# Patient Record
Sex: Female | Born: 1970
Health system: Southern US, Community
[De-identification: ages and names within clinical notes are randomized; demographics above are authoritative.]

## PROBLEM LIST (undated history)

## (undated) DIAGNOSIS — K602 Anal fissure, unspecified: Secondary | ICD-10-CM

## (undated) DIAGNOSIS — Z87442 Personal history of urinary calculi: Secondary | ICD-10-CM

## (undated) DIAGNOSIS — T8859XA Other complications of anesthesia, initial encounter: Secondary | ICD-10-CM

## (undated) DIAGNOSIS — F32A Depression, unspecified: Secondary | ICD-10-CM

## (undated) DIAGNOSIS — T4145XA Adverse effect of unspecified anesthetic, initial encounter: Secondary | ICD-10-CM

## (undated) DIAGNOSIS — M797 Fibromyalgia: Secondary | ICD-10-CM

## (undated) DIAGNOSIS — E785 Hyperlipidemia, unspecified: Secondary | ICD-10-CM

## (undated) DIAGNOSIS — M26629 Arthralgia of temporomandibular joint, unspecified side: Secondary | ICD-10-CM

## (undated) DIAGNOSIS — K8020 Calculus of gallbladder without cholecystitis without obstruction: Secondary | ICD-10-CM

## (undated) DIAGNOSIS — Z8489 Family history of other specified conditions: Secondary | ICD-10-CM

## (undated) DIAGNOSIS — I499 Cardiac arrhythmia, unspecified: Secondary | ICD-10-CM

## (undated) DIAGNOSIS — R112 Nausea with vomiting, unspecified: Secondary | ICD-10-CM

## (undated) DIAGNOSIS — Z9889 Other specified postprocedural states: Secondary | ICD-10-CM

## (undated) DIAGNOSIS — I7300 Raynaud's syndrome without gangrene: Secondary | ICD-10-CM

## (undated) DIAGNOSIS — M3500 Sicca syndrome, unspecified: Secondary | ICD-10-CM

## (undated) DIAGNOSIS — K219 Gastro-esophageal reflux disease without esophagitis: Secondary | ICD-10-CM

## (undated) DIAGNOSIS — R011 Cardiac murmur, unspecified: Secondary | ICD-10-CM

## (undated) DIAGNOSIS — F329 Major depressive disorder, single episode, unspecified: Secondary | ICD-10-CM

## (undated) DIAGNOSIS — Q8719 Other congenital malformation syndromes predominantly associated with short stature: Secondary | ICD-10-CM

## (undated) DIAGNOSIS — I4891 Unspecified atrial fibrillation: Secondary | ICD-10-CM

## (undated) HISTORY — DX: Sjogren syndrome, unspecified: M35.00

## (undated) HISTORY — DX: Unspecified atrial fibrillation: I48.91

## (undated) HISTORY — DX: Calculus of gallbladder without cholecystitis without obstruction: K80.20

## (undated) HISTORY — DX: Gastro-esophageal reflux disease without esophagitis: K21.9

## (undated) HISTORY — DX: Depression, unspecified: F32.A

## (undated) HISTORY — DX: Major depressive disorder, single episode, unspecified: F32.9

## (undated) HISTORY — PX: MANDIBLE SURGERY: SHX707

## (undated) HISTORY — DX: Fibromyalgia: M79.7

## (undated) HISTORY — DX: Personal history of urinary calculi: Z87.442

## (undated) HISTORY — PX: WISDOM TOOTH EXTRACTION: SHX21

## (undated) HISTORY — DX: Arthralgia of temporomandibular joint, unspecified side: M26.629

## (undated) HISTORY — DX: Hyperlipidemia, unspecified: E78.5

## (undated) HISTORY — DX: Anal fissure, unspecified: K60.2

## (undated) HISTORY — DX: Raynaud's syndrome without gangrene: I73.00

## (undated) HISTORY — DX: Cardiac murmur, unspecified: R01.1

## (undated) HISTORY — PX: ABDOMINAL HYSTERECTOMY: SHX81

---

## 2000-09-30 ENCOUNTER — Other Ambulatory Visit: Admission: RE | Admit: 2000-09-30 | Discharge: 2000-09-30 | Payer: Self-pay | Admitting: *Deleted

## 2000-10-06 ENCOUNTER — Emergency Department (HOSPITAL_COMMUNITY): Admission: EM | Admit: 2000-10-06 | Discharge: 2000-10-06 | Payer: Self-pay | Admitting: Emergency Medicine

## 2000-10-06 ENCOUNTER — Encounter: Payer: Self-pay | Admitting: Emergency Medicine

## 2000-12-03 ENCOUNTER — Ambulatory Visit (HOSPITAL_COMMUNITY): Admission: RE | Admit: 2000-12-03 | Discharge: 2000-12-03 | Payer: Self-pay | Admitting: *Deleted

## 2001-09-28 ENCOUNTER — Other Ambulatory Visit: Admission: RE | Admit: 2001-09-28 | Discharge: 2001-09-28 | Payer: Self-pay | Admitting: *Deleted

## 2003-09-04 ENCOUNTER — Other Ambulatory Visit: Admission: RE | Admit: 2003-09-04 | Discharge: 2003-09-04 | Payer: Self-pay | Admitting: Family Medicine

## 2004-11-05 ENCOUNTER — Ambulatory Visit: Payer: Self-pay | Admitting: Family Medicine

## 2005-04-23 ENCOUNTER — Ambulatory Visit: Payer: Self-pay | Admitting: Family Medicine

## 2005-04-26 ENCOUNTER — Emergency Department (HOSPITAL_COMMUNITY): Admission: EM | Admit: 2005-04-26 | Discharge: 2005-04-26 | Payer: Self-pay | Admitting: Emergency Medicine

## 2005-07-28 ENCOUNTER — Ambulatory Visit: Payer: Self-pay | Admitting: Family Medicine

## 2006-01-01 ENCOUNTER — Ambulatory Visit (HOSPITAL_COMMUNITY): Admission: RE | Admit: 2006-01-01 | Discharge: 2006-01-01 | Payer: Self-pay | Admitting: Obstetrics and Gynecology

## 2006-01-29 ENCOUNTER — Other Ambulatory Visit: Admission: RE | Admit: 2006-01-29 | Discharge: 2006-01-29 | Payer: Self-pay | Admitting: Obstetrics and Gynecology

## 2006-03-09 ENCOUNTER — Ambulatory Visit: Payer: Self-pay | Admitting: Family Medicine

## 2006-08-18 ENCOUNTER — Encounter (INDEPENDENT_AMBULATORY_CARE_PROVIDER_SITE_OTHER): Payer: Self-pay | Admitting: Specialist

## 2006-08-18 ENCOUNTER — Inpatient Hospital Stay (HOSPITAL_COMMUNITY): Admission: AD | Admit: 2006-08-18 | Discharge: 2006-08-20 | Payer: Self-pay | Admitting: Obstetrics and Gynecology

## 2006-11-10 ENCOUNTER — Ambulatory Visit: Payer: Self-pay | Admitting: Family Medicine

## 2007-02-27 ENCOUNTER — Emergency Department (HOSPITAL_COMMUNITY): Admission: EM | Admit: 2007-02-27 | Discharge: 2007-02-27 | Payer: Self-pay | Admitting: Family Medicine

## 2007-03-25 ENCOUNTER — Emergency Department (HOSPITAL_COMMUNITY): Admission: EM | Admit: 2007-03-25 | Discharge: 2007-03-25 | Payer: Self-pay | Admitting: Family Medicine

## 2007-08-10 ENCOUNTER — Emergency Department (HOSPITAL_COMMUNITY): Admission: EM | Admit: 2007-08-10 | Discharge: 2007-08-10 | Payer: Self-pay | Admitting: Family Medicine

## 2007-09-23 ENCOUNTER — Ambulatory Visit: Payer: Self-pay | Admitting: Family Medicine

## 2007-12-24 ENCOUNTER — Emergency Department (HOSPITAL_COMMUNITY): Admission: EM | Admit: 2007-12-24 | Discharge: 2007-12-24 | Payer: Self-pay | Admitting: Emergency Medicine

## 2008-05-31 ENCOUNTER — Ambulatory Visit: Payer: Self-pay | Admitting: Family Medicine

## 2008-05-31 DIAGNOSIS — R05 Cough: Secondary | ICD-10-CM

## 2008-06-21 ENCOUNTER — Ambulatory Visit: Payer: Self-pay | Admitting: Internal Medicine

## 2008-06-21 DIAGNOSIS — R740 Nonspecific elevation of levels of transaminase and lactic acid dehydrogenase [LDH]: Secondary | ICD-10-CM

## 2008-06-21 DIAGNOSIS — K219 Gastro-esophageal reflux disease without esophagitis: Secondary | ICD-10-CM

## 2008-06-21 DIAGNOSIS — M3500 Sicca syndrome, unspecified: Secondary | ICD-10-CM | POA: Insufficient documentation

## 2008-06-21 LAB — CONVERTED CEMR LAB
ALT: 44 units/L — ABNORMAL HIGH (ref 0–35)
AST: 40 units/L — ABNORMAL HIGH (ref 0–37)
Albumin: 4.1 g/dL (ref 3.5–5.2)
Alkaline Phosphatase: 71 units/L (ref 39–117)
Amylase: 92 units/L (ref 27–131)
Basophils Absolute: 0.2 10*3/uL — ABNORMAL HIGH (ref 0.0–0.1)
Basophils Relative: 3.9 % — ABNORMAL HIGH (ref 0.0–3.0)
Bilirubin, Direct: 0.1 mg/dL (ref 0.0–0.3)
Eosinophils Absolute: 0.1 10*3/uL (ref 0.0–0.7)
Eosinophils Relative: 1.9 % (ref 0.0–5.0)
HCT: 42.5 % (ref 36.0–46.0)
Hemoglobin: 14.7 g/dL (ref 12.0–15.0)
Lymphocytes Relative: 41.1 % (ref 12.0–46.0)
MCHC: 34.6 g/dL (ref 30.0–36.0)
MCV: 82.8 fL (ref 78.0–100.0)
Monocytes Absolute: 0.6 10*3/uL (ref 0.1–1.0)
Monocytes Relative: 7.9 % (ref 3.0–12.0)
Neutro Abs: 2.8 10*3/uL (ref 1.4–7.7)
Neutrophils Relative %: 45.2 % (ref 43.0–77.0)
Platelets: 316 10*3/uL (ref 150–400)
RBC: 5.13 M/uL — ABNORMAL HIGH (ref 3.87–5.11)
RDW: 13.2 % (ref 11.5–14.6)
Sed Rate: 42 mm/hr — ABNORMAL HIGH (ref 0–22)
TSH: 1.82 microintl units/mL (ref 0.35–5.50)
Total Bilirubin: 0.7 mg/dL (ref 0.3–1.2)
Total Protein: 9 g/dL — ABNORMAL HIGH (ref 6.0–8.3)
WBC: 6.1 10*3/uL (ref 4.5–10.5)

## 2008-06-25 ENCOUNTER — Encounter: Payer: Self-pay | Admitting: Internal Medicine

## 2008-06-25 ENCOUNTER — Ambulatory Visit: Payer: Self-pay | Admitting: Internal Medicine

## 2008-06-26 ENCOUNTER — Telehealth: Payer: Self-pay | Admitting: Internal Medicine

## 2008-06-27 LAB — CONVERTED CEMR LAB: UREASE: NEGATIVE

## 2008-06-28 ENCOUNTER — Encounter: Payer: Self-pay | Admitting: Internal Medicine

## 2008-07-02 ENCOUNTER — Ambulatory Visit (HOSPITAL_COMMUNITY): Admission: RE | Admit: 2008-07-02 | Discharge: 2008-07-02 | Payer: Self-pay | Admitting: Internal Medicine

## 2008-07-04 ENCOUNTER — Telehealth: Payer: Self-pay | Admitting: Internal Medicine

## 2008-07-04 DIAGNOSIS — K8020 Calculus of gallbladder without cholecystitis without obstruction: Secondary | ICD-10-CM | POA: Insufficient documentation

## 2008-07-06 ENCOUNTER — Ambulatory Visit: Payer: Self-pay | Admitting: Family Medicine

## 2008-07-06 DIAGNOSIS — M26629 Arthralgia of temporomandibular joint, unspecified side: Secondary | ICD-10-CM

## 2008-07-17 ENCOUNTER — Encounter (INDEPENDENT_AMBULATORY_CARE_PROVIDER_SITE_OTHER): Payer: Self-pay | Admitting: Internal Medicine

## 2008-07-17 ENCOUNTER — Encounter: Payer: Self-pay | Admitting: Internal Medicine

## 2008-07-29 ENCOUNTER — Emergency Department (HOSPITAL_COMMUNITY): Admission: EM | Admit: 2008-07-29 | Discharge: 2008-07-29 | Payer: Self-pay | Admitting: Family Medicine

## 2008-08-13 ENCOUNTER — Telehealth: Payer: Self-pay | Admitting: Internal Medicine

## 2008-08-17 ENCOUNTER — Encounter: Payer: Self-pay | Admitting: Internal Medicine

## 2008-08-20 ENCOUNTER — Encounter (INDEPENDENT_AMBULATORY_CARE_PROVIDER_SITE_OTHER): Payer: Self-pay | Admitting: Internal Medicine

## 2008-08-20 ENCOUNTER — Encounter (INDEPENDENT_AMBULATORY_CARE_PROVIDER_SITE_OTHER): Payer: Self-pay | Admitting: General Surgery

## 2008-08-20 ENCOUNTER — Ambulatory Visit (HOSPITAL_COMMUNITY): Admission: RE | Admit: 2008-08-20 | Discharge: 2008-08-20 | Payer: Self-pay | Admitting: General Surgery

## 2008-08-20 HISTORY — PX: CHOLECYSTECTOMY: SHX55

## 2008-09-04 ENCOUNTER — Encounter (INDEPENDENT_AMBULATORY_CARE_PROVIDER_SITE_OTHER): Payer: Self-pay | Admitting: Internal Medicine

## 2008-09-04 ENCOUNTER — Encounter: Payer: Self-pay | Admitting: Internal Medicine

## 2008-09-05 ENCOUNTER — Encounter (INDEPENDENT_AMBULATORY_CARE_PROVIDER_SITE_OTHER): Payer: Self-pay | Admitting: Internal Medicine

## 2008-10-24 ENCOUNTER — Ambulatory Visit: Payer: Self-pay | Admitting: Family Medicine

## 2008-10-24 LAB — CONVERTED CEMR LAB: Rapid Strep: NEGATIVE

## 2009-02-13 ENCOUNTER — Encounter (INDEPENDENT_AMBULATORY_CARE_PROVIDER_SITE_OTHER): Payer: Self-pay | Admitting: Internal Medicine

## 2009-03-17 ENCOUNTER — Emergency Department (HOSPITAL_COMMUNITY): Admission: EM | Admit: 2009-03-17 | Discharge: 2009-03-17 | Payer: Self-pay | Admitting: Family Medicine

## 2009-04-18 ENCOUNTER — Encounter: Payer: Self-pay | Admitting: Family Medicine

## 2009-07-08 ENCOUNTER — Ambulatory Visit: Payer: Self-pay | Admitting: Family Medicine

## 2009-08-13 ENCOUNTER — Ambulatory Visit: Payer: Self-pay | Admitting: Internal Medicine

## 2009-08-17 ENCOUNTER — Telehealth: Payer: Self-pay | Admitting: Family Medicine

## 2009-09-23 ENCOUNTER — Ambulatory Visit: Payer: Self-pay | Admitting: Family Medicine

## 2009-10-07 ENCOUNTER — Ambulatory Visit: Payer: Self-pay | Admitting: Family Medicine

## 2009-10-07 ENCOUNTER — Other Ambulatory Visit: Admission: RE | Admit: 2009-10-07 | Discharge: 2009-10-07 | Payer: Self-pay | Admitting: Family Medicine

## 2009-10-07 LAB — CONVERTED CEMR LAB
BUN: 9 mg/dL (ref 6–23)
Basophils Relative: 1.1 % (ref 0.0–3.0)
Bilirubin Urine: NEGATIVE
Bilirubin, Direct: 0.1 mg/dL (ref 0.0–0.3)
Calcium: 9.1 mg/dL (ref 8.4–10.5)
Cholesterol: 176 mg/dL (ref 0–200)
Creatinine, Ser: 0.7 mg/dL (ref 0.4–1.2)
Eosinophils Relative: 1.6 % (ref 0.0–5.0)
GFR calc non Af Amer: 99.37 mL/min (ref >60)
HCT: 41.6 % (ref 36.0–46.0)
Hemoglobin: 13.5 g/dL (ref 12.0–15.0)
Ketones, urine, test strip: NEGATIVE
LDL Cholesterol: 115 mg/dL — ABNORMAL HIGH (ref 0–99)
Lymphs Abs: 2 10*3/uL (ref 0.7–4.0)
MCV: 85.9 fL (ref 78.0–100.0)
Monocytes Absolute: 0.5 10*3/uL (ref 0.1–1.0)
Neutro Abs: 2.5 10*3/uL (ref 1.4–7.7)
Neutrophils Relative %: 47.5 % (ref 43.0–77.0)
Nitrite: NEGATIVE
Protein, U semiquant: NEGATIVE
RBC: 4.84 M/uL (ref 3.87–5.11)
Total Bilirubin: 0.8 mg/dL (ref 0.3–1.2)
Total CHOL/HDL Ratio: 4
Triglycerides: 100 mg/dL (ref 0.0–149.0)
Urobilinogen, UA: 0.2
VLDL: 20 mg/dL (ref 0.0–40.0)
WBC: 5.2 10*3/uL (ref 4.5–10.5)

## 2009-10-10 ENCOUNTER — Encounter (INDEPENDENT_AMBULATORY_CARE_PROVIDER_SITE_OTHER): Payer: Self-pay | Admitting: *Deleted

## 2009-10-23 ENCOUNTER — Encounter: Payer: Self-pay | Admitting: Family Medicine

## 2010-01-24 ENCOUNTER — Ambulatory Visit: Payer: Self-pay | Admitting: Family Medicine

## 2010-01-24 DIAGNOSIS — R0600 Dyspnea, unspecified: Secondary | ICD-10-CM | POA: Insufficient documentation

## 2010-01-24 DIAGNOSIS — J069 Acute upper respiratory infection, unspecified: Secondary | ICD-10-CM | POA: Insufficient documentation

## 2010-01-24 DIAGNOSIS — R0602 Shortness of breath: Secondary | ICD-10-CM

## 2010-01-27 ENCOUNTER — Telehealth: Payer: Self-pay | Admitting: Family Medicine

## 2010-03-04 ENCOUNTER — Ambulatory Visit: Payer: Self-pay | Admitting: Family Medicine

## 2010-03-04 DIAGNOSIS — R109 Unspecified abdominal pain: Secondary | ICD-10-CM

## 2010-03-04 DIAGNOSIS — Z87442 Personal history of urinary calculi: Secondary | ICD-10-CM | POA: Insufficient documentation

## 2010-03-04 LAB — CONVERTED CEMR LAB
Blood in Urine, dipstick: NEGATIVE
Glucose, Urine, Semiquant: NEGATIVE
Urobilinogen, UA: 0.2
WBC Urine, dipstick: NEGATIVE
pH: 6

## 2010-03-10 ENCOUNTER — Ambulatory Visit: Payer: Self-pay | Admitting: Cardiology

## 2010-04-24 ENCOUNTER — Encounter: Payer: Self-pay | Admitting: Family Medicine

## 2010-09-29 ENCOUNTER — Ambulatory Visit
Admission: RE | Admit: 2010-09-29 | Discharge: 2010-09-29 | Payer: Self-pay | Source: Home / Self Care | Attending: Family Medicine | Admitting: Family Medicine

## 2010-09-29 DIAGNOSIS — J019 Acute sinusitis, unspecified: Secondary | ICD-10-CM | POA: Insufficient documentation

## 2010-09-29 DIAGNOSIS — J0100 Acute maxillary sinusitis, unspecified: Secondary | ICD-10-CM | POA: Insufficient documentation

## 2010-10-03 ENCOUNTER — Ambulatory Visit
Admission: RE | Admit: 2010-10-03 | Discharge: 2010-10-03 | Payer: Self-pay | Source: Home / Self Care | Attending: Family Medicine | Admitting: Family Medicine

## 2010-10-03 ENCOUNTER — Telehealth: Payer: Self-pay | Admitting: Family Medicine

## 2010-10-05 ENCOUNTER — Encounter: Payer: Self-pay | Admitting: Internal Medicine

## 2010-10-16 NOTE — Progress Notes (Signed)
Summary: Feeling bad-in the office last week  Phone Note Call from Patient   Caller: Patient Reason for Call: Acute Illness Summary of Call: Pt was seen by Dr.Bedsole on last week, says she was told if not any better to call back and something would be called in for her.  Pt says she is experiencing some coughing up green sputnum,says she does not feel any better... Call back # [phone] [facility].Marland KitchenMarland KitchenDaine Gip  Jan 27, 2010 10:10 AM  Initial call taken by: Daine Gip,  Jan 27, 2010 10:10 AM    New/Updated Medications: AZITHROMYCIN 250 MG  TABS (AZITHROMYCIN) 2 by  mouth today and then 1 daily for 4 days Prescriptions: AZITHROMYCIN 250 MG  TABS (AZITHROMYCIN) 2 by  mouth today and then 1 daily for 4 days  #6 x 0   Entered and Authorized by:   Ruthe Mannan MD   Signed by:   Ruthe Mannan MD on 01/27/2010   Method used:   [facility]        [id number] (retail[facility]       [address]       [place], [place]  [address]       Ph: [phone] or [phone]       Fax: [phone]   RxID:   [id number]   Appended Document: Feeling bad-in the office last week Called pt informed her medication has been sent to the pharmacy, told her if she has problems to call us back, told pt to check w/ the pharmacy before the end of the day..cdavis 01-27-2010-11:50am

## 2010-10-16 NOTE — Assessment & Plan Note (Signed)
Summary: CPX/CLE   Vital Signs:  Patient profile:   40 year old female LMP:     09/28/2009 Height:      67 inches Weight:      151.50 pounds BMI:     23.81 Temp:     98.3 degrees F oral Pulse rate:   84 / minute Pulse rhythm:   regular BP sitting:   92 / 58  (left arm) Cuff size:   regular  Vitals Entered By: Delilah Shan CMA Duncan Dull) (October 07, 2009 9:09 AM) CC: CPX LMP (date): 09/28/2009     Enter LMP: 09/28/2009   History of Present Illness: 40 yo female with h/o Sjogren's syndrome here for CPX/Pap with complaint of pelvic pressure.  Pelvic pressure- occurred for a few hours over the weekend, then resolved.  Had some mild dysuria with it which also resolved.  no fevers, no n/v/d.  Has h/o kidney stones which felt like this in past.  No back pain, no hematuria.  Sjorgren's- followed by rheum.  Has been on Plaquenil since 03/2009.  Has helped a little with leg pain.  Has not found much relief from the dry eyes, followed by optho regularly.  Well woman- G1P1.  LMP 09/28/09.  Uses condoms for [facility].  No h/o abnormal pap smears.  No family or personal h/o breast, uterine, ovarian, or cervical cancer.  Current Medications (verified): 1)  Mobic 15 Mg Tabs (Meloxicam) .... Take 1 Tablet By Mouth Two Times A Day 2)  Plaquenil 200 Mg Tabs (Hydroxychloroquine Sulfate) .... Take 1 Tablet By Mouth Two Times A Day 3)  Ventolin Hfa 108 (90 Base) Mcg/act Aers (Albuterol Sulfate) .... 2 Puff Every 4-6 Hours As Needed 4)  Fluticasone Propionate 50 Mcg/act Susp (Fluticasone Propionate) .... 2 Sprays in Each Nostril Daily For Sinus and Allergy Symptoms 5)  Prilosec Otc 20 Mg Tbec (Omeprazole Magnesium) .... One By Mouth Daily  Allergies: 1)  ! Sulfa 2)  ! Augmentin  Past History:  Past Medical History: Last updated: 06/21/2008 Anal Fissure Depression Fibromyalgia GERD Hyperlipidemia  Past Surgical History: Last updated: 09/05/2008 Unremarkable cholecystectomy --08/20/08  Family  History: Last updated: 06/21/2008 Bone Cancer: Grandma Family History of Colon Polyps:Father Family History of Diabetes: grandmother Family History of Heart Disease: grandmother  Social History: Last updated: 06/21/2008 married Patient has never smoked.  Alcohol Use - no Daily Caffeine Use: 2 cups per day Illicit Drug Use - no Patient does not get regular exercise.   Review of Systems      See HPI General:  Denies chills, fatigue, and fever. Eyes:  Complains of eye irritation; denies blurring, eye pain, halos, itching, light sensitivity, red eye, vision loss-1 eye, and vision loss-both eyes. ENT:  Denies difficulty swallowing. CV:  Denies chest pain or discomfort. Resp:  Denies shortness of breath. GI:  Denies abdominal pain and bloody stools. GU:  Complains of dysuria; denies hematuria, incontinence, and urinary frequency. Derm:  Denies rash. Psych:  Denies anxiety and depression. Endo:  Denies cold intolerance and heat intolerance. Heme:  Denies abnormal bruising, enlarge lymph nodes, fevers, pallor, and skin discoloration.  Physical Exam  General:  alert.  NAD Eyes:  PERRLA, no icterus. Ears:  R ear normal and L ear normal.   Nose:  External nasal examination shows no deformity or inflammation. Nasal mucosa are pink and moist without lesions or exudates. Mouth:  Oral mucosa and oropharynx without lesions or exudates.  Teeth in good repair. Breasts:  No mass, nodules, thickening, tenderness,  bulging, retraction, inflamation, nipple discharge or skin changes noted.   On left breast- 2 cm raised nevus, regular borders, no change in size or color per pt Lungs:  Normal respiratory effort, chest expands symmetrically. Lungs are clear to auscultation, no crackles or wheezes. Heart:  Regular rate and rhythm; no murmurs, rubs,  or bruits. Abdomen:  No suprapubic tenderness No CVA tenderness Genitalia:  Pelvic Exam:        External: normal female genitalia without lesions or  masses        Vagina: normal without lesions or masses        Cervix: normal without lesions or masses        Adnexa: normal bimanual exam without masses or fullness        Uterus: normal by palpation        Pap smear: performed Psych:  Cognition and judgment appear intact. Alert and cooperative with normal attention span and concentration. No apparent delusions, illusions, hallucinations   Impression & Recommendations:  Problem # 1:  Preventive Health Care (ICD-V70.0) Reviewed preventive care protocols, scheduled due services, and updated immunizations Discussed nutrition, exercise, diet, and healthy lifestyle.  FLP, pap today.  Problem # 2:  SCREENING FOR MALIGNANT NEOPLASM OF THE CERVIX (ICD-V76.2) Pap today. Orders: Pap Smear, Thin Prep ( Collection of) (E4540)  Problem # 3:  DYSURIA (ICD-788.1) Assessment: New Resolved with unremarkable UA.  Possibly passed a kidney stone.  Advised to let me know if symptoms, recur. The following medications were removed from the medication list:    Amoxicillin-pot Clavulanate 875-125 Mg Tabs (Amoxicillin-pot clavulanate) .Marland Kitchen... 1 tab by mouth two times a day with food for sinus infection  Orders: UA Dipstick w/o Micro (manual) (98119)  Problem # 4:  SJOGREN'S SYNDROME (ICD-710.2) Assessment: Unchanged Stable.  Per rheum, will check CBC since she is on Plaquenil and BME. Orders: Venipuncture (14782) TLB-BMP (Basic Metabolic Panel-BMET) (80048-METABOL) TLB-CBC Platelet - w/Differential (85025-CBCD)  Complete Medication List: 1)  Mobic 15 Mg Tabs (Meloxicam) .... Take 1 tablet by mouth two times a day 2)  Plaquenil 200 Mg Tabs (Hydroxychloroquine sulfate) .... Take 1 tablet by mouth two times a day 3)  Ventolin Hfa 108 (90 Base) Mcg/act Aers (Albuterol sulfate) .... 2 puff every 4-6 hours as needed 4)  Fluticasone Propionate 50 Mcg/act Susp (Fluticasone propionate) .... 2 sprays in each nostril daily for sinus and allergy symptoms 5)   Prilosec Otc 20 Mg Tbec (Omeprazole magnesium) .... One by mouth daily  Other Orders: TLB-Lipid Panel (80061-LIPID) TLB-Hepatic/Liver Function Pnl (80076-HEPATIC) Prescriptions: PRILOSEC OTC 20 MG TBEC (OMEPRAZOLE MAGNESIUM) one by mouth daily  #30 x 11   Entered and Authorized by:   Ruthe Mannan MD   Signed by:   Ruthe Mannan MD on 10/07/2009   Method used:   Print then Give to Patient   RxID:   [id number]   Current Allergies (reviewed today): ! SULFA ! AUGMENTIN   Laboratory Results   Urine Tests   Date/Time Reported: October 07, 2009 9:33 AM   Routine Urinalysis   Color: yellow Appearance: Clear Glucose: negative   (Normal Range: Negative) Bilirubin: negative   (Normal Range: Negative) Ketone: negative   (Normal Range: Negative) Spec. Gravity: >=1.030   (Normal Range: 1.003-1.035) Blood: negative   (Normal Range: Negative) pH: 6.0   (Normal Range: 5.0-8.0) Protein: negative   (Normal Range: Negative) Urobilinogen: 0.2   (Normal Range: 0-1) Nitrite: negative   (Normal Range: Negative) Leukocyte Esterace: negative   (Normal Range:  Negative)

## 2010-10-16 NOTE — Assessment & Plan Note (Signed)
Summary: 4:15 CONGESTION,COUGH/CLE   Vital Signs:  Patient profile:   40 year old female Height:      67 inches Weight:      131.75 pounds BMI:     20.71 Temp:     98.7 degrees F oral Pulse rate:   92 / minute Pulse rhythm:   regular BP sitting:   124 / 76  (left arm) Cuff size:   regular  Vitals Entered By: Delilah Shan CMA Mitzi Lilja Dull) (September 29, 2010 4:22 PM) CC: Congestion, cough   History of Present Illness: 40 year old was sick last week.  Pt has no fever this AM, but has been coughing since Friday.  Sinus HA.  No ear pain.  Voice changed.  short of breath in the winter at baseline due to Sjogren's syndrome and dry mouth.  On plaquenil and mobic.  Minimal sputum.    Allergies: 1)  ! Sulfa 2)  ! Augmentin  Past History:  Past Medical History: Last updated: 03/04/2010 Anal Fissure Depression Fibromyalgia GERD Hyperlipidemia Nephrolithiasis, hx of  Review of Systems       See HPI.  Otherwise negative.    Physical Exam  General:  GEN: nad, alert and oriented HEENT: mucous membranes slightly dry as expected TM w/o erythema, nasal epithelium injected, OP with cobblestoning, max sinus tender to palpation bilaterally NECK: supple w/o LA CV: rrr. PULM: no inc wob, ctab EXT: no edema    Impression & Recommendations:  Problem # 1:  SINUSITIS - ACUTE-NOS (ICD-461.9) Will treat given underlying Sjrogen's disease.  Use SABA as needed and start the antibiotics today.  Supporitve tx o/w.  She agrees.  follow up as needed.  Nontoxic.   Her updated medication list for this problem includes:    Fluticasone Propionate 50 Mcg/act Susp (Fluticasone propionate) .Marland Kitchen... 2 sprays in each nostril daily for sinus and allergy symptoms as needed    Zithromax 250 Mg Tabs (Azithromycin) .Marland Kitchen... 2 by mouth today and 1 by mouth once daily for 4 days after that  Orders: Prescription Created Electronically [phone])  Complete Medication List: 1)  Mobic 15 Mg Tabs (Meloxicam) .... Take 1  tablet by mouth two times a day 2)  Plaquenil 200 Mg Tabs (Hydroxychloroquine sulfate) .... Take 1 tablet by mouth two times a day 3)  Ventolin Hfa 108 (90 Base) Mcg/act Aers (Albuterol sulfate) .... 2 puff every 4-6 hours as needed 4)  Fluticasone Propionate 50 Mcg/act Susp (Fluticasone propionate) .... 2 sprays in each nostril daily for sinus and allergy symptoms as needed 5)  Prilosec Otc 20 Mg Tbec (Omeprazole magnesium) .... One by mouth daily, rarely 6)  Vitamin D 1000 Unit Tabs (Cholecalciferol) .... Once daily 7)  Zithromax 250 Mg Tabs (Azithromycin) .... 2 by mouth today and 1 by mouth once daily for 4 days after that  Patient Instructions: 1)  Get plenty of rest, drink lots of clear liquids, and use Tylenol for fever and comfort. Start the antibiotics today.   2)  Please allow patient to work from home this week.  Potentially contagious.  Prescriptions: ZITHROMAX 250 MG TABS (AZITHROMYCIN) 2 by mouth today and 1 by mouth once daily for 4 days after that  #6 x 0   Entered and Authorized by:   Crawford Givens MD   Signed by:   Crawford Givens MD on 09/29/2010   Method used:   [facility]        [facility]  [phone][facility] (retail)  [address]       [place], [place]  [address]       Ph: [phone] or [phone]       Fax: [phone]   RxID:   [id number]    Orders Added: 1)  Est. Patient Level III [76160] 2)  Prescription Created Electronically [phone]    Current Allergies (reviewed today): ! SULFA ! AUGMENTIN

## 2010-10-16 NOTE — Assessment & Plan Note (Signed)
Summary: DISCUSS KIDNEY STONE/CLE   Vital Signs:  Patient profile:   40 year old female Height:      67 inches Weight:      155.13 pounds BMI:     24.38 Temp:     98.3 degrees F oral Pulse rate:   72 / minute Pulse rhythm:   regular BP sitting:   102 / 70  (left arm) Cuff size:   regular  Vitals Entered By: Linde Gillis CMA Duncan Dull) (March 04, 2010 8:00 AM) CC: discuss kidney stones   History of Present Illness: 40 yo here for ? kidney stones.  Had a h/o kidney stones several years ago.  Over past 6 months, has intermittent suprapubic pain, "bladder spasms." No dysuria, hematuria, fevers, nausea or vomiting. Sometimes has back pain associated with it.  Has been taking Vitamin D after told that she was deficient, prescribed by rheumatology. She has concerns that perhaps this is worsening her symptoms. Also notices that it is usually aggrevated by caffeine/soft drinks.  Current Medications (verified): 1)  Mobic 15 Mg Tabs (Meloxicam) .... Take 1 Tablet By Mouth Two Times A Day 2)  Plaquenil 200 Mg Tabs (Hydroxychloroquine Sulfate) .... Take 1 Tablet By Mouth Two Times A Day 3)  Ventolin Hfa 108 (90 Base) Mcg/act Aers (Albuterol Sulfate) .... 2 Puff Every 4-6 Hours As Needed 4)  Fluticasone Propionate 50 Mcg/act Susp (Fluticasone Propionate) .... 2 Sprays in Each Nostril Daily For Sinus and Allergy Symptoms 5)  Prilosec Otc 20 Mg Tbec (Omeprazole Magnesium) .... One By Mouth Daily  Allergies: 1)  ! Sulfa 2)  ! Augmentin  Past History:  Past Surgical History: Last updated: 09/05/2008 Unremarkable cholecystectomy --08/20/08  Family History: Last updated: 06/21/2008 Bone Cancer: Grandma Family History of Colon Polyps:Father Family History of Diabetes: grandmother Family History of Heart Disease: grandmother  Social History: Last updated: 06/21/2008 married Patient has never smoked.  Alcohol Use - no Daily Caffeine Use: 2 cups per day Illicit Drug Use - no Patient  does not get regular exercise.   Risk Factors: Exercise: no (06/21/2008)  Risk Factors: Smoking Status: never (06/21/2008)  Past Medical History: Anal Fissure Depression Fibromyalgia GERD Hyperlipidemia Nephrolithiasis, hx of  Review of Systems      See HPI General:  Denies fever and malaise. GI:  Complains of abdominal pain; denies nausea and vomiting. GU:  Denies dysuria, hematuria, urinary frequency, and urinary hesitancy.  Physical Exam  General:  alert.  NAD Mouth:  MMM Abdomen:  No suprapubic tenderness No CVA tenderness Psych:  Cognition and judgment appear intact. Alert and cooperative with normal attention span and concentration. No apparent delusions, illusions, hallucinations   Impression & Recommendations:  Problem # 1:  ABDOMINAL PAIN, SUPRAPUBIC (ICD-789.09) Assessment New UA neg for blood or signs of infection, will send for culture to verify absence of infection. Given h/o nephrolithiasis, seen on ultrasound in past, will send for CT of abd/pelvix as it is gold standard to see if she has other stones causing obstructive symptoms. If neg, consider IC. Her updated medication list for this problem includes:    Mobic 15 Mg Tabs (Meloxicam) .Marland Kitchen... Take 1 tablet by mouth two times a day  Orders: Radiology Referral (Radiology) T-Culture, Urine ([id number]) UA Dipstick w/o Micro (manual) (09811)  Complete Medication List: 1)  Mobic 15 Mg Tabs (Meloxicam) .... Take 1 tablet by mouth two times a day 2)  Plaquenil 200 Mg Tabs (Hydroxychloroquine sulfate) .... Take 1 tablet by mouth two times a day  3)  Ventolin Hfa 108 (90 Base) Mcg/act Aers (Albuterol sulfate) .... 2 puff every 4-6 hours as needed 4)  Fluticasone Propionate 50 Mcg/act Susp (Fluticasone propionate) .... 2 sprays in each nostril daily for sinus and allergy symptoms 5)  Prilosec Otc 20 Mg Tbec (Omeprazole magnesium) .... One by mouth daily  Patient Instructions: 1)  Please stop by to see Shirlee Limerick  on your way out.  Current Allergies (reviewed today): ! SULFA ! AUGMENTIN  Laboratory Results   Urine Tests  Date/Time Received: March 04, 2010 8:16 AM   Routine Urinalysis   Color: yellow Appearance: Clear Glucose: negative   (Normal Range: Negative) Bilirubin: negative   (Normal Range: Negative) Ketone: negative   (Normal Range: Negative) Spec. Gravity: >=1.030   (Normal Range: 1.003-1.035) Blood: negative   (Normal Range: Negative) pH: 6.0   (Normal Range: 5.0-8.0) Protein: trace   (Normal Range: Negative) Urobilinogen: 0.2   (Normal Range: 0-1) Nitrite: negative   (Normal Range: Negative) Leukocyte Esterace: negative   (Normal Range: Negative)        Appended Document: DISCUSS KIDNEY STONE/CLE

## 2010-10-16 NOTE — Progress Notes (Signed)
Summary: still feeling sick   Phone Note Call from Patient Call back at [phone]   Caller: Patient Call For: Ruthe Mannan MD Summary of Call: Patient was seen on the 16th and she says that today she feels worse than before. She has now finished her zpak, but she feels that the congestion has moved into her chest. She wants to know if she should be seen again for possibly a chest xray or if she should give it a couple of more days. [facility]. Initial call taken by: Melody Comas,  October 03, 2010 8:46 AM  Follow-up for Phone Call        Patient called back again to see what Dr. Para March has suggested, I explained to her that he is seeing patients and hasn't had a chance to see the note. She is saying that if she needs to be seen, she wants to be seen today, but there are not appt. Please advise.  Melody Comas  October 03, 2010 11:18 AM   Additional Follow-up for Phone Call Additional follow up Details #1::        If she is short of breath, then she needs to be seen today and added onto my schedule at 4:30.  Otherwise, it would be reasonable to give this a few more days as the antibiotics are still working- she could follow up next week.  thanks.  Additional Follow-up by: Crawford Givens MD,  October 03, 2010 11:24 AM    Additional Follow-up for Phone Call Additional follow up Details #2::    Patient added to end of day.  Follow-up by: Melody Comas,  October 03, 2010 11:29 AM

## 2010-10-16 NOTE — Letter (Signed)
Summary: [facility]   Imported By: Lanelle Bal 11/02/2009 11:22:17  _____________________________________________________________________  External Attachment:    Type:   Image     Comment:   External Document

## 2010-10-16 NOTE — Letter (Signed)
Summary: Results Follow up Letter  [place] [facility]  [address] [place], [place] [address]   Phone: [phone]  Fax: [phone]    10/10/2009 MRN: [id number]    NIKAYA NASBY [address] [place], [place]  [address]    Dear Ms. Laural Benes,  The following are the results of your recent test(s):  Test         Result    Pap Smear:        Normal __X___  Not Normal _____ Comments:   Repeat in 1-2 years. ______________________________________________________ Cholesterol: LDL(Bad cholesterol):         Your goal is less than:         HDL (Good cholesterol):       Your goal is more than: Comments:  ______________________________________________________ Mammogram:        Normal _____  Not Normal _____ Comments:  ___________________________________________________________________ Hemoccult:        Normal _____  Not normal _______ Comments:    _____________________________________________________________________ Other Tests:    We routinely do not discuss normal results over the telephone.  If you desire a copy of the results, or you have any questions about this information we can discuss them at your next office visit.   Sincerely,    Ruthe Mannan,  M.D.  TA:lsf

## 2010-10-16 NOTE — Letter (Signed)
Summary: [facility]   Imported By: Lanelle Bal 05/07/2010 12:22:13  _____________________________________________________________________  External Attachment:    Type:   Image     Comment:   External Document

## 2010-10-16 NOTE — Assessment & Plan Note (Signed)
Summary: SOB,wheezing,congested/alc   Vital Signs:  Patient profile:   40 year old female Height:      67 inches Weight:      131.75 pounds BMI:     20.71 O2 Sat:      98 % on Room air Temp:     98.2 degrees F oral Pulse rate:   88 / minute Pulse rhythm:   regular BP sitting:   118 / 80  (left arm) Cuff size:   regular  Vitals Entered By: Delilah Shan CMA Duncan Dull) (October 03, 2010 4:39 PM)  O2 Flow:  Room air CC: SOB, wheezing, chest congestion   History of Present Illness: ST and fever is better, no fever since Wednesday.  Now with "rattle" in chest and cough.  No sputum.  + Wheeze.  Using ventolin occ, some help with cough but jittery with use- even 1 puff.  Last dose of zmax today.  intolerant of prednisone.    Allergies: 1)  ! Sulfa 2)  ! Augmentin 3)  ! * Albuterol  Past History:  Past Medical History: Anal Fissure Depression Fibromyalgia GERD Hyperlipidemia Nephrolithiasis, hx of Sjogren's syndrome  Review of Systems       See HPI.  Otherwise negative.    Physical Exam  General:  GEN: nad, alert and oriented HEENT: mucous membranes slightly dry as expected TM w/o erythema, nasal epithelium mildly  injected, OP with minimal cobblestoning, max sinus not tender to palpation bilaterally NECK: supple w/o LA CV: rrr. PULM: no inc wob, clear on L side but occ scattered ronchi on R, no wheeze EXT: no edema    Impression & Recommendations:  Problem # 1:  COUGH (ICD-786.2) With R>L ronchi, I would continue antibiotic coverage and broaden with quinolone.  D/w patient and will switch to xopenex due to SE from albuterol.  Hopefully she'll tolerate this better and it will help with the cough.  Nontoxic and okay for outpatient follow up.  I didn't check CXR today as patient had no increase in wob and even normal appearing cxr wouldn't have changed mgmt.  I d/w patient and she understood.  She'll call back with update and follow up sooner if symptoms are increasing.   she is okay for outpatient follow up and agrees with plan.  Orders: Prescription Created Electronically [id number])  Complete Medication List: 1)  Mobic 15 Mg Tabs (Meloxicam) .... Take 1 tablet by mouth two times a day 2)  Plaquenil 200 Mg Tabs (Hydroxychloroquine sulfate) .... Take 1 tablet by mouth two times a day 3)  Xopenex Hfa 45 Mcg/act Aero (Levalbuterol tartrate) .Marland Kitchen.. 1-2 puffs every 4 hours for cough as needed 4)  Fluticasone Propionate 50 Mcg/act Susp (Fluticasone propionate) .... 2 sprays in each nostril daily for sinus and allergy symptoms as needed 5)  Prilosec Otc 20 Mg Tbec (Omeprazole magnesium) .... One by mouth daily, rarely 6)  Vitamin D 1000 Unit Tabs (Cholecalciferol) .... Once daily 7)  Avelox 400 Mg Tabs (Moxifloxacin hcl) .Marland Kitchen.. 1 by mouth once daily for 7days  Patient Instructions: 1)  Start the antibiotics today and use the xopenex instead of the ventolin for the cough.  Call me with an update on Monday.  If you get short of breath between now and then, notify a doctor.  Prescriptions: XOPENEX HFA 45 MCG/ACT AERO (LEVALBUTEROL TARTRATE) 1-2 puffs every 4 hours for cough as needed  #1 x 1   Entered and Authorized by:   Crawford Givens MD   Signed by:  Crawford Givens MD on 10/03/2010   Method used:   [facility]        [facility]  [place] (retail)       [address]       [place], [place]  [address]       Ph: [phone] or [phone]       Fax: [phone]   RxID:   [id number] AVELOX 400 MG TABS (MOXIFLOXACIN HCL) 1 by mouth once daily for 7days  #7 x 0   Entered and Authorized by:   Crawford Givens MD   Signed by:   Crawford Givens MD on 10/03/2010   Method used:   [facility]        [facility]  [id number] ([facility]       [address]       [place], [place]  [address]       Ph: [phone] or [phone]       Fax: [phone]   RxID:   [id number]    Orders Added: 1)  Est. Patient Level III [20254] 2)  Prescription  Created Electronically [id number]

## 2010-10-16 NOTE — Assessment & Plan Note (Signed)
Summary: CONGESTION,COUGH/CLE   Vital Signs:  Patient profile:   40 year old female Height:      67 inches Weight:      152.8 pounds BMI:     24.02 Temp:     98.3 degrees F oral Pulse rate:   84 / minute Pulse rhythm:   regular BP sitting:   100 / 60  (left arm) Cuff size:   regular  Vitals Entered By: Benny Lennert CMA Duncan Dull) (Jan 24, 2010 10:42 AM)  History of Present Illness: Chief complaint cough and congestion  Using claritn, antihistamine alternate ly. Using inhaler intermittantly.  Acute Visit History:      The patient complains of cough, nasal discharge, sinus problems, and sore throat.  These symptoms began 5 days ago.  She denies chest pain, earache, and fever.  Other comments include: 40 year old sick.        The patient notes shortness of breath.  The character of the cough is described as nonproductive.  There is no history of wheezing or sleep interference associated with her cough.        She complains of sinus pressure, teeth aching, ears being blocked, nasal congestion, and purulent drainage.             Problems Prior to Update: 1)  Uri  (ICD-465.9) 2)  Health Maintenance Exam  (ICD-V70.0) 3)  Screening For Lipoid Disorders  (ICD-V77.91) 4)  Screening For Malignant Neoplasm of The Cervix  (ICD-V76.2) 5)  Tmj Pain  (ICD-524.62) 6)  Cholelithiasis  (ICD-574.20) 7)  Nonspec Elevation of Levels of Transaminase/ldh  (ICD-790.4) 8)  Gerd  (ICD-530.81) 9)  Sjogren's Syndrome  (ICD-710.2)  Current Medications (verified): 1)  Mobic 15 Mg Tabs (Meloxicam) .... Take 1 Tablet By Mouth Two Times A Day 2)  Plaquenil 200 Mg Tabs (Hydroxychloroquine Sulfate) .... Take 1 Tablet By Mouth Two Times A Day 3)  Ventolin Hfa 108 (90 Base) Mcg/act Aers (Albuterol Sulfate) .... 2 Puff Every 4-6 Hours As Needed 4)  Fluticasone Propionate 50 Mcg/act Susp (Fluticasone Propionate) .... 2 Sprays in Each Nostril Daily For Sinus and Allergy Symptoms 5)  Prilosec Otc 20 Mg Tbec  (Omeprazole Magnesium) .... One By Mouth Daily  Allergies: 1)  ! Sulfa 2)  ! Augmentin  Past History:  Past medical, surgical, family and social histories (including risk factors) reviewed, and no changes noted (except as noted below).  Past Medical History: Anal Fissure Depression Fibromyalgia GERD Hyperlipidemia  Past Surgical History: Reviewed history from 09/05/2008 and no changes required. Unremarkable cholecystectomy --08/20/08  Family History: Reviewed history from 06/21/2008 and no changes required. Bone Cancer: Grandma Family History of Colon Polyps:Father Family History of Diabetes: grandmother Family History of Heart Disease: grandmother  Social History: Reviewed history from 06/21/2008 and no changes required. married Patient has never smoked.  Alcohol Use - no Daily Caffeine Use: 2 cups per day Illicit Drug Use - no Patient does not get regular exercise.   Review of Systems General:  Complains of fatigue. CV:  Denies chest pain or discomfort. Resp:  Denies coughing up blood. GI:  Denies abdominal pain. GU:  Denies dysuria.   Impression & Recommendations:  Problem # 1:  URI (ICD-465.9)  Her updated medication list for this problem includes:    Mobic 15 Mg Tabs (Meloxicam) .Marland Kitchen... Take 1 tablet by mouth two times a day Likely viral bronchitis.Marland Kitchengiven chest tightness, decreased peak flows, mildly. Instructed on symptomatic treatment. Call if symptoms not improving in next 4-5  days as expected or increase in SOB. Use mucinex D, increase nasal saline irrigation to 2-3 times daily. Use albuterol as needed chest tightness.   Problem # 2:  DYSPNEA (ICD-786.05) Given reactive airway response to viral/bacterial and allergies as well as some SOB when no infection...recommend returnt o see primary in 1 month for lung function tests.   Complete Medication List: 1)  Mobic 15 Mg Tabs (Meloxicam) .... Take 1 tablet by mouth two times a day 2)  Plaquenil 200 Mg  Tabs (Hydroxychloroquine sulfate) .... Take 1 tablet by mouth two times a day 3)  Ventolin Hfa 108 (90 Base) Mcg/act Aers (Albuterol sulfate) .... 2 puff every 4-6 hours as needed 4)  Fluticasone Propionate 50 Mcg/act Susp (Fluticasone propionate) .... 2 sprays in each nostril daily for sinus and allergy symptoms 5)  Prilosec Otc 20 Mg Tbec (Omeprazole magnesium) .... One by mouth daily  Patient Instructions: 1)  Call if symptoms not imrpoving in next 4-5 days as expected or increase in SOB. Use mucinex D, increase nasal saline irrigation to 2-3 times daily. 2)  Use albuterol as needed chest tightness.   Current Allergies (reviewed today): ! SULFA ! AUGMENTIN

## 2010-10-23 ENCOUNTER — Encounter: Payer: Self-pay | Admitting: Family Medicine

## 2010-11-11 NOTE — Letter (Signed)
Summary: [facility]   Imported By: Kassie Mends 11/04/2010 10:07:09  _____________________________________________________________________  External Attachment:    Type:   Image     Comment:   External Document  Appended Document: [address] [facility] stable sjogrens

## 2010-12-18 ENCOUNTER — Encounter: Payer: Self-pay | Admitting: Family Medicine

## 2011-01-27 NOTE — Op Note (Signed)
[name], [name] NO.:  [id number]   MEDICAL RECORD NO.:  [id number]          PATIENT TYPE:  AMB   LOCATION:  DAY                          FACILITY[facility]  [facility]   PHYSICIAN:  Juanetta Gosling, MDDATE OF BIRTH:  1970-12-24   DATE OF PROCEDURE:  08/20/2008  DATE OF DISCHARGE:                               OPERATIVE REPORT   PREOPERATIVE DIAGNOSIS:  Symptomatic cholelithiasis.   POSTOPERATIVE DIAGNOSIS:  Symptomatic cholelithiasis.   OPERATION/PROCEDURE:  Laparoscopic cholecystectomy.   SURGEON:  Juanetta Gosling, M.D.   ASSISTANT:  None.   ANESTHESIA:  General.   SPECIMEN:  Gallbladder and contents to pathology.   ESTIMATED BLOOD LOSS:  Minimal.   COMPLICATIONS:  None.   DRAINS:  None.   DISPOSITION:  To PACU in stable condition.   HISTORY:  [name] is a 40 year old female with a past medical  history significant for Sjogren's disease with a  several-year history  of epigastric pain that has been tried to be treated for reflux but this  has not been successful.  She does have some occasional dysphagia  associated with her Sjogren's syndrome.  On her physical examination she  had some mild tenderness in her right upper quadrant and an ultrasound  that showed a 1 cm mobile gallstone, normal wall thickness, no  sonographic Murphy's,  no pericholecystic fluid and a normal common duct  at 3.7 mm.  Her LFTs prior to surgery were all normal.  She has been  referred by Dr. Lina Sar for evaluation for cholecystectomy and she  and I had a  long talk and I think that it would be reasonable to  perform a cholecystectomy that certainly might alleviate some of her  symptoms.   DESCRIPTION OF PROCEDURE:  After informed consent was obtained, the  patient was taken to the operating room.  She was administered 1 gram of  cefoxitin without complication and sequential compression devices were  placed on the lower extremities.  She then underwent general  endotracheal anesthesia without complication.  Her abdomen was then  prepped and draped in standard sterile surgical fashion and a surgical  time-out was then performed.   A 10-mm vertical infraumbilical incision was then made and dissection  was carried out down to the level of her fascia.  A Kocher clamp was  inserted.  The fascia was pulled up and an 11-blade was used to incise  the fascia and a Kelly clamp was used to enter the abdomen. A 0 Vicryl  pursestring suture was then placed in the fascia.  The [facility] trocar was  then inserted.  The abdomen was then insufflated to 15 mmHg pressure  without complication.  She was then placed in the head-up position.  Three further 5-mm ports were placed; one in the epigastrium and two in  the right upper quadrant after infiltration with local anesthetic under  direct vision without complication. The gallbladder was then retracted  cephalad and lateral.  Then dissection was carried out in the triangle  of Calot, identifying the cystic duct and the cystic artery and  obtaining the critical view of  safety.  Once this view of safety had  been obtained, I then clipped the duct three times and cut the duct. I  clipped the artery and then cut the artery leaving two clips applied to  the artery.  The gallbladder was then removed from the liver bed.  It  was noted to be somewhat adherent to the liver.  I did enter the  gallbladder wall position with a small leakage of bile that was  controlled.  There were no stones that leaked at this point.  The  gallbladder was then removed from the liver bed and a 5-mm camera was  then placed in the epigastrium.  The EndoCatch bag was placed through  the umbilicus.  Gallbladder was placed in the EndoCatch and removed  easily.  The 10-mm camera was then reinserted.  The gallbladder bed was  viewed.  Hemostasis was observed.  Clips were in good position.  Irrigation was performed until this was clear.  Upon completion  of this,  I then removed the [facility] trocar, tied down the Vicryl pursestring  suture which was air tight with no evidence of any further defect.  I  then desufflated the abdomen, removed all the other trocars without  complication.  A 4-0 Monocryl was then used to close the skin.  Dermabond was then placed over the wounds.  She tolerated this well was  transferred to the recovery room in stable condition.      Juanetta Gosling, MD  Electronically Signed     MCW/MEDQ  D:  08/20/2008  T:  08/20/2008  Job:  [id number]   cc:   Marne A. Tower, MD  [address] [place], [place] [address]   Hedwig Morton. Juanda Chance, MD  [address]  [place]  [place] [address]

## 2011-01-30 NOTE — H&P (Signed)
NAME:  Jill Leonard, Jill Leonard NO.:  [id number]   MEDICAL RECORD NO.:  [id number]          PATIENT TYPE:  INP   LOCATION:  9171                          FACILITY:  [facility]   PHYSICIAN:  Janine Limbo, M.D.DATE OF BIRTH:  15-Jul-1971   DATE OF ADMISSION:  08/18/2006  DATE OF DISCHARGE:                              HISTORY & PHYSICAL   HISTORY OF THE PRESENT ILLNESS:  Ms. Jill Leonard is a 40 year old married  white female, primigravida at 39-1/7ths weeks, who presents with  contractions every three to five minute.  She denies leakage of bright  red bleeding.  No signs or symptoms of pregnancy-induced hypertension.  Her pregnancy had been followed by the [facility] M.D.  [facility] and has been remarkable for:  1. AMA.  2. Sjogren's syndrome.  3. Left breast mass.  4. History of irritable bowel syndrome.  5. History fibromyalgia.  6. Mild toxoplasmosis in the past.  7. Group B Strep negative.   The patient presented for care at [facility] on December 31, 2005. She had an ultrasound on that day giving her an [facility] of August 24, 2006.  The patient declined first trimester screening, quad screen  and amniocentesis. The amniocentesis was recommended for advanced  maternal age.  Ultrasound at [redacted] weeks gestation showed growth consistent  with previous dating.  The patient had positive antinuclear antibody  that was found.  She had baseline 24-hour urine for  protein, creatinine  clearance and comprehensive metabolic panel at 22 weeks.  Ultrasound at  26 weeks showed growth consistent previous dating and estimated fetal  weight is in the 89th percentile with normal fluid.  One-hour Glucola  was elevated at 145. Three-hour glucose tolerance test was within normal  limits.  Ultrasound at 33 weeks showed the estimated fetal weight at the  86th percentile.  The patient started antenatal testing at that time  secondary to her Sjogren's syndrome and positive  Raynaud's.  The patient  also has a normal echocardiogram.  Ultrasound at 36 weeks showed growth  to 80th percentile  with normal fluid.  The rest of her prenatal care  was unremarkable.   The patient's prenatal labs were collected on Jan 29, 2006.  These  showed a hemoglobin 13.5, hematocrit 33.8, platelets 325,000. Blood type  O positive, antibody negative.  RPR nonreactive.  Rubella nonimmune.  Hepatitis B surface antigen negative.  Pap smear within normal limits.  Gonorrhea negative.  Chlamydia negative.  Cystic fibrosis negative. One-  hour glucose on September 7, 2007was 145.  Three-hour glucose tolerance  test on June 01, 2006 was normal at 78, 147, 125 and 110.  Culture  of the vaginal tract for group B Strep on July 22, 2006 es negative.   PAST OBSTETRICAL HISTORY:  The patient is a primigravida.   ALLERGIES:  THE PATIENT HAS AN ALLERGY TO SULFA RESULTING IN VOMITING.  SHE GETS BLISTERS AND SWELLING  FROM LATEX.   PAST GYNECOLOGICAL HISTORY:  The patient experienced menarche at age 23  with 28-day cycles lasting.  She use oral contraceptions from age 22  until 2007.  She had an abnormal Pap.   PAST MEDICAL HISTORY:  The patient reports having had the usual normal  childhood illnesses.  She has positive toxoplasmosis four years ago.  She has a history of IBS and rare cystitis. The patient was diagnosed  with Sjogren's in May 2006.  She also had fibromyalgia.  The patient has  a history of Doppler.   PAST SURGICAL HISTORY:  The surgical history is remarkable for wisdom  teeth extraction and jaw surgery.   FAMILY HISTORY:  The family medical history is remarkable with chronic  hypertension.  Maternal grandmother with varicosities.  Maternal  grandfather with tuberculosis. Father had a thyroidectomy.  Paternal  grandmother with CVA.  Maternal grandmother with unknown type of cancer.  Father and br with bipolar disorder.  Maternal grandmother with  depression.    GENETIC HISTORY:  The genetic history is remarkable for the patient with  a heart murmur.  Three cousins with mental retardation.   SOCIAL HISTORY:  The patient is married to the father of the baby; his  name is Jill Leonard.  He is involved and supportive.  The patient is a  Tree surgeon.  The father has some college and is  an account rep.  They  deny any alcohol, tobacco or illicit drug use with this pregnancy.   OBJECTIVE:  VITAL SIGNS:  The vital signs are stable.  She is afebrile.  HEENT:  The head, eyes, ears, nose and throat are grossly within normal  limits.  CHEST:  The chest is clear to auscultation.  HEART:  The heart has a regular rate and rhythm.  ABDOMEN:  The is gravida and contour.  Fundal height extending  approximately 39 cm. Fetal heart rate is reactive and reassuring.  Contractions are every two to three minutes.  PELVIC EXAMINATION:  Cervix is 2+ cm, 100% effaced, vertex, -2,  spontaneous rupture of membranes with exam with moderate meconium  stained fluid.  EXTREMITIES:  The extremities are normal.   ASSESSMENT:  1. Intrauterine pregnancy at term.  2. Spontaneous rupture of membranes.  3. Early labor.   PLAN:  The plan is to:  1. Admit to the birthing suite.  2. Routine M.D. orders.  3. Anticipate spontaneous vaginal birth.     Cam Hai, C.N.M.      Janine Limbo, M.D.  Electronically Signed   KS/MEDQ  D:  08/18/2006  T:  08/18/2006  Job:  [id number]

## 2011-05-01 ENCOUNTER — Other Ambulatory Visit: Payer: Self-pay | Admitting: Internal Medicine

## 2011-05-01 NOTE — Telephone Encounter (Signed)
rx sent to pharmacy by e-script  

## 2011-06-12 ENCOUNTER — Encounter: Payer: Self-pay | Admitting: Family Medicine

## 2011-06-12 ENCOUNTER — Ambulatory Visit (INDEPENDENT_AMBULATORY_CARE_PROVIDER_SITE_OTHER): Payer: BC Managed Care – PPO | Admitting: Family Medicine

## 2011-06-12 VITALS — BP 110/74 | HR 84 | Temp 98.6°F | Wt 134.5 lb

## 2011-06-12 DIAGNOSIS — J329 Chronic sinusitis, unspecified: Secondary | ICD-10-CM

## 2011-06-12 MED ORDER — DOXYCYCLINE HYCLATE 100 MG PO CAPS: 100.0000 mg | ORAL_CAPSULE | Freq: Two times a day (BID) | ORAL | Status: AC

## 2011-06-12 NOTE — Assessment & Plan Note (Signed)
Going on 10+ days, doing all OTC treatment should be doing. Add on doxycycline 10d course. Allergic to augmentin (GI upset). Update [place] if not improving as expected.

## 2011-06-12 NOTE — Patient Instructions (Signed)
This does sound like a sinus infection.  Take doxycycline twice daily for 10 days. Update [place] if not improving as expected, or if fever >101, or other concenrs. Sinusitis Sinuses are air pockets within the bones of your face. The growth of bacteria within a sinus leads to infection. Infection keeps the sinuses from draining. This infection is called sinusitis. SYMPTOMS There will be different areas of pain depending on which sinuses have become infected.  The maxillary sinuses often produce pain beneath the eyes.   Frontal sinusitis may cause pain in the middle of the forehead and above the eyes.  Other problems (symptoms) include:  Toothaches.   Colored, pus-like (purulent) drainage from the nose.   Any swelling, warmth, or tenderness over the sinus areas may be signs of infection.  TREATMENT Sinusitis is most often determined by an exam and you may have x-rays taken. If x-rays have been taken, make sure you obtain your results. Or find out how you are to obtain them. Your caregiver may give you medications (antibiotics). These are medications that will help kill the infection. You may also be given a medication (decongestant) that helps to reduce sinus swelling.  HOME CARE INSTRUCTIONS  Only take over-the-counter or prescription medicines for pain, discomfort, or fever as directed by your caregiver.   Drink extra fluids. Fluids help thin the mucus so your sinuses can drain more easily.   Applying either moist heat or ice packs to the sinus areas may help relieve discomfort.   Use saline nasal sprays to help moisten your sinuses. The sprays can be found at your local drugstore.  SEEK IMMEDIATE MEDICAL CARE IF YOU DEVELOP:  High fever that is still present after two days of antibiotic treatment.   Increasing pain, severe headaches, or toothache.   Nausea, vomiting, or drowsiness.   Unusual swelling around the face or trouble seeing.  MAKE SURE YOU:   Understand these  instructions.   Will watch your condition.   Will get help right away if you are not doing well or get worse.  Document Released: 08/31/2005 Document Re-Released: 08/13/2008 [facility] Patient Information 2011 [place], [place].

## 2011-06-12 NOTE — Progress Notes (Signed)
  Subjective:    Patient ID: Jill Leonard, female    DOB: 1971/01/17, 40 y.o.   MRN: [id number]  HPI CC: sinusitis?  10d h/o sxs - started as sinus congestion, sinus headache, drainage and ST, ears stuffy.  Tried pseudophed, claritin, zyrtec D, flonase, xopenex inhaler, saline spray.  Also had some SOB last week, not since.  All in head now, congestion.  Did fell chilly but no elevated temperature.  Also taking mucinex.  No smokers at home, husband with similar sxs.  No h/o COPD, asthma.  + h/o sjogren's disease, on plaquenil.  No fevers/chills, abd pain, n/v.  Review of Systems Per HPI    Objective:   Physical Exam  Nursing note and vitals reviewed. Constitutional: She appears well-developed and well-nourished. No distress.  HENT:  Head: Normocephalic and atraumatic.  Right Ear: Hearing, tympanic membrane, external ear and ear canal normal.  Left Ear: Hearing, tympanic membrane, external ear and ear canal normal.  Nose: No mucosal edema or rhinorrhea. Right sinus exhibits maxillary sinus tenderness. Right sinus exhibits no frontal sinus tenderness. Left sinus exhibits maxillary sinus tenderness. Left sinus exhibits no frontal sinus tenderness.  Mouth/Throat: Uvula is midline, oropharynx is clear and moist and mucous membranes are normal. No oropharyngeal exudate, posterior oropharyngeal edema, posterior oropharyngeal erythema or tonsillar abscesses.  Eyes: Conjunctivae and EOM are normal. Pupils are equal, round, and reactive to light. No scleral icterus.  Neck: Normal range of motion. Neck supple. No JVD present. No thyromegaly present.  Cardiovascular: Normal rate, regular rhythm, normal heart sounds and intact distal pulses.   No murmur heard. Pulmonary/Chest: Effort normal and breath sounds normal. No respiratory distress. She has no wheezes. She has no rales.  Lymphadenopathy:    She has no cervical adenopathy.  Skin: Skin is warm and dry. No rash noted.            Assessment & Plan:

## 2011-06-16 LAB — POCT URINALYSIS DIP (DEVICE)
Bilirubin Urine: NEGATIVE
Glucose, UA: NEGATIVE mg/dL
Hgb urine dipstick: NEGATIVE
Specific Gravity, Urine: <=1.005 (ref 1.005–1.030)

## 2011-06-19 LAB — COMPREHENSIVE METABOLIC PANEL
ALT: 29 U/L (ref 0–35)
Alkaline Phosphatase: 68 U/L (ref 39–117)
CO2: 30 mEq/L (ref 19–32)
GFR calc non Af Amer: >60 mL/min (ref >60)
Glucose, Bld: 82 mg/dL (ref 70–99)
Potassium: 3.9 mEq/L (ref 3.5–5.1)
Sodium: 139 mEq/L (ref 135–145)

## 2011-06-19 LAB — HEMOGLOBIN AND HEMATOCRIT, BLOOD: Hemoglobin: 15.1 g/dL — ABNORMAL HIGH (ref 12.0–15.0)

## 2011-07-19 ENCOUNTER — Emergency Department (INDEPENDENT_AMBULATORY_CARE_PROVIDER_SITE_OTHER)
Admission: EM | Admit: 2011-07-19 | Discharge: 2011-07-19 | Disposition: A | Discharge status: Home / Self Care | Payer: BC Managed Care – PPO | Source: Home / Self Care | Attending: Emergency Medicine | Admitting: Emergency Medicine

## 2011-07-19 ENCOUNTER — Encounter (HOSPITAL_COMMUNITY): Payer: Self-pay

## 2011-07-19 DIAGNOSIS — B029 Zoster without complications: Secondary | ICD-10-CM

## 2011-07-19 HISTORY — DX: Other congenital malformation syndromes predominantly associated with short stature: Q87.19

## 2011-07-19 MED ORDER — VALACYCLOVIR HCL 1 G PO TABS: 1000.0000 mg | ORAL_TABLET | Freq: Three times a day (TID) | ORAL | Status: AC

## 2011-07-19 NOTE — ED Provider Notes (Signed)
History     CSN: [id number] Arrival date & time: 07/19/2011  2:05 PM   First MD Initiated Contact with Patient 07/19/11 1352      Chief Complaint  Patient presents with  . Rash    Pt has rash on lt side that started on Tuesday as "sensitivity", scattered non blistery bumps on side    (Consider location/radiation/quality/duration/timing/severity/associated sxs/prior treatment) HPI Comments: Pt first noticed sensitivity to touch Lt lower abdomen 5 days ago. Then that evening noticed 2 red bumps. Skin sensitivity to touch persists and rash is spreading. Used an otc hydrocortisone cream last night w/o improvement.   Patient is a 40 y.o. female presenting with rash. The history is provided by the patient.  Rash  Episode onset: 5 days ago. There has been no fever. The rash is present on the abdomen. The pain is mild. The pain has been constant since onset. Associated symptoms include pain. Pertinent negatives include no blisters and no itching. She has tried steriods for the symptoms.    Past Medical History  Diagnosis Date  . Anal fissure   . Depression   . Fibromyalgia   . GERD (gastroesophageal reflux disease)   . HLD (hyperlipidemia)   . History of nephrolithiasis   . Sjogren's syndrome   . Calculus of gallbladder without mention of cholecystitis or obstruction   . Arthralgia of temporomandibular joint     Past Surgical History  Procedure Date  . Cholecystectomy 08/20/08    Family History  Problem Relation Age of Onset  . Bone cancer      Grandmother  . Colon polyps Father   . Diabetes      Grandmother  . Heart disease      Grandmother    History  Substance Use Topics  . Smoking status: Never Smoker   . Smokeless tobacco: Not on file  . Alcohol Use: Yes     Socially-rare    OB History    Grav Para Term Preterm Abortions TAB SAB Ect Mult Living                  Review of Systems  Constitutional: Negative for fever and chills.  Respiratory: Negative for  cough and wheezing.   Gastrointestinal: Negative for nausea, vomiting and abdominal pain.  Musculoskeletal: Negative for back pain.  Skin: Positive for rash. Negative for itching.    Allergies  Albuterol; WUJ:[id number]+AOZHYQMVH+QIONGEXBMW acid+aspartame; Sulfa antibiotics; and Sulfonamide derivatives  Home Medications   Current Outpatient Rx  Name Route Sig Dispense Refill  . VITAMIN D 1000 UNITS PO TABS Oral Take 1,000 Units by mouth daily.      Marland Kitchen FLUTICASONE PROPIONATE 50 MCG/ACT NA SUSP  2 SPRAYS IN EACH NOSTRIL DAILY FOR SINUS AND ALLERGY SYMPTOMS 16 g 0  . HYDROXYCHLOROQUINE SULFATE 200 MG PO TABS Oral Take 200 mg by mouth 2 (two) times daily.      Marland Kitchen LEVALBUTEROL TARTRATE 45 MCG/ACT IN AERO Inhalation Inhale 1-2 puffs into the lungs every 4 (four) hours as needed.      . MELOXICAM 15 MG PO TABS Oral Take 15 mg by mouth 2 (two) times daily.      Marland Kitchen RANITIDINE HCL 150 MG PO TABS Oral Take 150 mg by mouth 2 (two) times daily as needed.        BP 113/69  Pulse 87  Temp(Src) 99.1 F (37.3 C) (Oral)  Resp 15  SpO2 99%  Physical Exam  Constitutional: She appears well-developed and well-nourished. No distress.  HENT:  Head: Normocephalic and atraumatic.  Neck: Normal range of motion. Neck supple.  Cardiovascular: Normal rate, regular rhythm and normal heart sounds.   Pulmonary/Chest: Effort normal and breath sounds normal. No respiratory distress.  Skin: Skin is warm and dry. Rash noted. Rash is papular.       Scattered in linear distribution LLQ abd erythematous papules  Psychiatric: She has a normal mood and affect.    ED Course  Procedures (including critical care time)  Labs Reviewed - No data to display No results found.   No diagnosis found.    MDM          Melody Comas, PA 07/19/11 [id number]

## 2011-07-19 NOTE — ED Notes (Signed)
Bed:UC9<BR> Expected date:07/19/11<BR> Expected time: 2:47 PM<BR> Means of arrival:Other<BR> Comments:<BR> Network repair

## 2011-08-20 ENCOUNTER — Ambulatory Visit (INDEPENDENT_AMBULATORY_CARE_PROVIDER_SITE_OTHER): Payer: BC Managed Care – PPO | Admitting: Family Medicine

## 2011-08-20 ENCOUNTER — Encounter: Payer: Self-pay | Admitting: Family Medicine

## 2011-08-20 DIAGNOSIS — J069 Acute upper respiratory infection, unspecified: Secondary | ICD-10-CM

## 2011-08-20 MED ORDER — DOXYCYCLINE HYCLATE 100 MG PO CAPS: 100.0000 mg | ORAL_CAPSULE | Freq: Two times a day (BID) | ORAL | Status: AC

## 2011-08-20 NOTE — Progress Notes (Signed)
  Subjective:    Patient ID: Jill Leonard, female    DOB: 04/24/1971, 40 y.o.   MRN: [id number]  HPI Pt of Dr Elmer Sow here as acute appt. Her son who is 38 years old, was diagnosed with the flu over the weekend and was given Tamiflu. She is also on Tamiflu (given by her son's doctor)since Monday (now Thursday) and 'is not bouncing back as quick as he did." She has noit had fever in the last 24 hrs. She feels worse today than she has all week. She has not had fever since late Tues PM, she had headache until Tues, some right ear pain with touching the ear, minimal rhinitis normal for her this time of year, some ST that has gone off and on all week, cough tha is productive of yellow discharge which hurts when she coughs, no N/V, no diarrhea, minimal achiness but feel rundown.  She has taken Tanmiflu since Mon, Tyl and Mucinex. She also used Xopenex yesterday, given here for occasional breathing problems. She uses it rarely.  She does not sleep well generally.    Review of SystemsHas Sjogren's syndrome that in my opinion complicates URI fighting.     Objective:   Physical Exam  Constitutional: She appears well-developed and well-nourished. No distress.  HENT:  Head: Normocephalic and atraumatic.  Right Ear: External ear normal.  Left Ear: External ear normal.  Nose: Nose normal.  Mouth/Throat: Oropharynx is clear and moist. No oropharyngeal exudate.  Eyes: Conjunctivae and EOM are normal. Pupils are equal, round, and reactive to light.  Neck: Normal range of motion. Neck supple. No thyromegaly present.  Cardiovascular: Normal rate, regular rhythm and normal heart sounds.   Pulmonary/Chest: Effort normal and breath sounds normal. She has no wheezes. She has no rales.  Lymphadenopathy:    She has no cervical adenopathy.  Skin: She is not diaphoretic.          Assessment & Plan:

## 2011-08-20 NOTE — Patient Instructions (Signed)
Take Guaifenesin (400mg ), take 11/2 tabs by mouth AM and NOON. Get GUAIFENESIN by  [facility] or [facility] and getting MUCOUS RELIEF EXPECTORANT/CONGESTION. DO NOT GET MUCINEX (Timed Release Guaifenesin) Drink lots of fluids. Gargle with 30ccs of warm salt water every half hour for 2 days as able. Take Tyl 500mg  2 tabs three times a day. Keep lozenge in mouth all day for the next few days. Take Doxy if needed.

## 2011-08-20 NOTE — Assessment & Plan Note (Signed)
Probably flu being treated appropriately with Tamiflu but must concentrate on getting congestion out of the system and if unsuccessful, add AB.  See instructions.

## 2011-09-29 ENCOUNTER — Ambulatory Visit (INDEPENDENT_AMBULATORY_CARE_PROVIDER_SITE_OTHER): Payer: BC Managed Care – PPO | Admitting: Family Medicine

## 2011-09-29 ENCOUNTER — Encounter: Payer: Self-pay | Admitting: Family Medicine

## 2011-09-29 VITALS — BP 102/64 | HR 98 | Temp 98.7°F | Wt 133.5 lb

## 2011-09-29 DIAGNOSIS — J069 Acute upper respiratory infection, unspecified: Secondary | ICD-10-CM

## 2011-09-29 DIAGNOSIS — J111 Influenza due to unidentified influenza virus with other respiratory manifestations: Secondary | ICD-10-CM

## 2011-09-29 MED ORDER — ONDANSETRON HCL 4 MG PO TABS: 4.0000 mg | ORAL_TABLET | Freq: Three times a day (TID) | ORAL | Status: AC | PRN

## 2011-09-29 NOTE — Patient Instructions (Signed)
This is likely the flu. Take Zofran as needed, Tylenol/Ibuprofen as needed for fever and chills. Call me if no improvement in next several days.

## 2011-09-29 NOTE — Progress Notes (Signed)
SUBJECTIVE:  Jill Leonard is a 41 y.o. female who present complaining of flu-like symptoms: fevers, chills, myalgias, congestion, sore throat and cough for 4 days. Denies dyspnea or wheezing.  She was vomiting this morning but has eaten some food since.  Patient Active Problem List  Diagnoses  . TMJ PAIN  . GERD  . CHOLELITHIASIS  . SJOGREN'S SYNDROME  . DYSPNEA  . ABDOMINAL PAIN, SUPRAPUBIC  . NONSPEC ELEVATION OF LEVELS OF TRANSAMINASE/LDH  . NEPHROLITHIASIS, HX OF  . SINUSITIS - ACUTE-NOS  . Sinusitis  . URI, acute   Past Medical History  Diagnosis Date  . Anal fissure   . Depression   . Fibromyalgia   . GERD (gastroesophageal reflux disease)   . HLD (hyperlipidemia)   . History of nephrolithiasis   . Sjogren's syndrome   . Calculus of gallbladder without mention of cholecystitis or obstruction   . Arthralgia of temporomandibular joint   . Sjogren - Larsson's syndrome    Past Surgical History  Procedure Date  . Cholecystectomy 08/20/08   History  Substance Use Topics  . Smoking status: Never Smoker   . Smokeless tobacco: Never Used  . Alcohol Use: Yes     Socially-rare   Family History  Problem Relation Age of Onset  . Bone cancer      Grandmother  . Colon polyps Father   . Diabetes      Grandmother  . Heart disease      Grandmother   Allergies  Allergen Reactions  . Albuterol     REACTION: intolerant- "jittery" after use  . WUJ:[id number]+AOZHYQMVH+QIONGEXBMW Acid+Aspartame     REACTION: GI upset  . Sulfa Antibiotics   . Sulfonamide Derivatives     REACTION: intolerant   Current Outpatient Prescriptions on File Prior to Visit  Medication Sig Dispense Refill  . cholecalciferol (VITAMIN D) 1000 UNITS tablet Take 1,000 Units by mouth daily.        . fluticasone (FLONASE) 50 MCG/ACT nasal spray 2 SPRAYS IN EACH NOSTRIL DAILY FOR SINUS AND ALLERGY SYMPTOMS  16 g  0  . hydroxychloroquine (PLAQUENIL) 200 MG tablet Take 200 mg by mouth 2 (two) times  daily.        Marland Kitchen levalbuterol (XOPENEX HFA) 45 MCG/ACT inhaler Inhale 1-2 puffs into the lungs every 4 (four) hours as needed.        . meloxicam (MOBIC) 15 MG tablet Take 15 mg by mouth 2 (two) times daily.        . ranitidine (ZANTAC) 150 MG tablet Take 150 mg by mouth 2 (two) times daily as needed.         The PMH, PSH, Social History, Family History, Medications, and allergies have been reviewed in [facility], and have been updated if relevant.  OBJECTIVE: BP 102/64  Pulse 98  Temp(Src) 98.7 F (37.1 C) (Oral)  Wt 133 lb 8 oz (60.555 kg)  Appears moderately ill but not toxic; temperature as noted in vitals. Ears normal. Throat and pharynx normal.  Neck supple. No adenopathy in the neck. Sinuses non tender. The chest is clear.  ASSESSMENT: Influenza  PLAN: Symptomatic therapy suggested: rest, increase fluids, BRAT diet, gradual reintroduction of usual diet and call prn if symptoms persist or worsen. Call or return to clinic prn if these symptoms worsen or fail to improve as anticipated.

## 2011-11-24 ENCOUNTER — Encounter: Payer: Self-pay | Admitting: Family Medicine

## 2011-11-26 ENCOUNTER — Encounter: Payer: Self-pay | Admitting: *Deleted

## 2011-11-26 ENCOUNTER — Encounter: Payer: Self-pay | Admitting: Family Medicine

## 2011-11-26 LAB — HM MAMMOGRAPHY: HM Mammogram: NORMAL

## 2011-12-13 ENCOUNTER — Ambulatory Visit (INDEPENDENT_AMBULATORY_CARE_PROVIDER_SITE_OTHER): Payer: BC Managed Care – PPO | Admitting: Family Medicine

## 2011-12-13 VITALS — BP 114/74 | HR 120 | Temp 99.7°F | Resp 16 | Ht 66.0 in | Wt 136.0 lb

## 2011-12-13 DIAGNOSIS — M3500 Sicca syndrome, unspecified: Secondary | ICD-10-CM

## 2011-12-13 DIAGNOSIS — R509 Fever, unspecified: Secondary | ICD-10-CM

## 2011-12-13 DIAGNOSIS — B9789 Other viral agents as the cause of diseases classified elsewhere: Secondary | ICD-10-CM

## 2011-12-13 LAB — POCT CBC
Granulocyte percent: 68.7 %G (ref 37–80)
MCV: 85.1 fL (ref 80–97)
MID (cbc): 0.4 (ref 0–0.9)
POC Granulocyte: 2.6 (ref 2–6.9)
POC LYMPH PERCENT: 20.7 %L (ref 10–50)
Platelet Count, POC: 234 10*3/uL (ref 142–424)
RDW, POC: 15.1 %

## 2011-12-13 NOTE — Progress Notes (Signed)
  Subjective:    Patient ID: Jill Leonard, female    DOB: September 01, 1971, 41 y.o.   MRN: [id number]  HPI Patient presents with fever, facial congestion, cough and myalgias.  Sick contact at work with influenza  Review of Systems  Constitutional: Positive for fatigue.  HENT: Positive for congestion and postnasal drip. Negative for ear pain and facial swelling.   Respiratory: Positive for cough. Negative for shortness of breath and wheezing.    PMH/ Sjogren's syndrome(plaquenil)     Objective:   Physical Exam  Constitutional: She appears well-developed.  Neck: Neck supple.  Cardiovascular: Normal rate, regular rhythm and normal heart sounds.   Pulmonary/Chest: Effort normal and breath sounds normal.  Neurological: She is alert.  Skin: Skin is warm.      Results for orders placed in visit on 12/13/11  POCT CBC      Component Value Range   WBC 3.8 (*) 4.6 - 10.2 (K/uL)   Lymph, poc 0.8  0.6 - 3.4    POC LYMPH PERCENT 20.7  10 - 50 (%L)   MID (cbc) 0.4  0 - 0.9    POC MID % 10.6  0 - 12 (%M)   POC Granulocyte 2.6  2 - 6.9    Granulocyte percent 68.7  37 - 80 (%G)   RBC 4.73  4.04 - 5.48 (M/uL)   Hemoglobin 13.3  12.2 - 16.2 (g/dL)   HCT, POC 40.9  81.1 - 47.9 (%)   MCV 85.1  80 - 97 (fL)   MCH, POC 28.1  27 - 31.2 (pg)   MCHC 33.0  31.8 - 35.4 (g/dL)   RDW, POC 91.4     Platelet Count, POC 234  142 - 424 (K/uL)   MPV 8.4  0 - 99.8 (fL)       Assessment & Plan:   1. Viral illness  POCT CBC  2. Sjogren's syndrome     Mobic alternating with tylenol Flonase NS TID prn OK for 3 days Tamiflu not effective given days of illness Ok to return to work once fever free for 24 hours Anticipatory guidance

## 2011-12-18 ENCOUNTER — Telehealth: Payer: Self-pay

## 2011-12-18 MED ORDER — HYDROCODONE-HOMATROPINE 5-1.5 MG/5ML PO SYRP: ORAL_SOLUTION | ORAL | Status: AC

## 2011-12-18 NOTE — Telephone Encounter (Signed)
Pt states that she is no better.  She still feels weak, shakey, bad cough, and chest congestion.  Is there anything we can do for her?

## 2011-12-18 NOTE — Telephone Encounter (Signed)
This is typical of the flu. We can all in some cough medication that will also help with aches. Make sure she is also taking Mucinex. Push fluids.  Jill Leonard

## 2011-12-18 NOTE — Telephone Encounter (Signed)
PT WAS SEEN BY DR Hal Hope AND DIAGNOSED WITH THE FLU, WAS TOLD TO CALL BACK IF NO BETTER AND IT'S BEEN OVER A WEEK AND SHE STILL ISN'T MUCH BETTER PLEASE CALL [phone]

## 2011-12-19 NOTE — Telephone Encounter (Signed)
Spoke with patient and notified below.  Patient states cough is better, and she doesn't really want cough syrup called in, so will hold off on that for now.  She will continue Mucinex and RTC if not improving.

## 2012-08-08 ENCOUNTER — Ambulatory Visit (INDEPENDENT_AMBULATORY_CARE_PROVIDER_SITE_OTHER): Payer: BC Managed Care – PPO | Admitting: Family Medicine

## 2012-08-08 ENCOUNTER — Encounter: Payer: Self-pay | Admitting: Family Medicine

## 2012-08-08 VITALS — BP 110/74 | HR 72 | Temp 98.4°F | Wt 141.5 lb

## 2012-08-08 DIAGNOSIS — J329 Chronic sinusitis, unspecified: Secondary | ICD-10-CM

## 2012-08-08 MED ORDER — AMOXICILLIN-POT CLAVULANATE 875-125 MG PO TABS: 1.0000 | ORAL_TABLET | Freq: Two times a day (BID) | ORAL | Status: AC

## 2012-08-08 NOTE — Progress Notes (Signed)
  Subjective:    Patient ID: Jill Leonard, female    DOB: 23-Jul-1971, 41 y.o.   MRN: [id number]  HPI CC: cold  H/o sjogren's, stopped plaquenil 1 mo ago.  18d h/o cold.  Yesterday starting to feel better, then yesterday afternoon started having severe ST, trouble swallowing.  Night time chills.  Ears feel muffled.  Congested but actually better than prior.  +PNDrainage.  Today thicker mucous than in past.  So far has taken tylenol.   No coughing, HA, ear or tooth pain,   H/o allergic rhinitis, worse in spring and fall, h/o sinusitis in past.  No asthma.   No smokers at home.   Has been around sick ppl recently.  Past Medical History  Diagnosis Date  . Anal fissure   . Depression   . Fibromyalgia   . GERD (gastroesophageal reflux disease)   . HLD (hyperlipidemia)   . History of nephrolithiasis   . Sjogren's syndrome   . Calculus of gallbladder without mention of cholecystitis or obstruction   . Arthralgia of temporomandibular joint   . Sjogren - Larsson's syndrome      Review of Systems Per HPI    Objective:   Physical Exam  Nursing note and vitals reviewed. Constitutional: She appears well-developed and well-nourished. No distress.  HENT:  Head: Normocephalic and atraumatic.  Right Ear: Hearing, tympanic membrane, external ear and ear canal normal.  Left Ear: Hearing, tympanic membrane, external ear and ear canal normal.  Nose: Mucosal edema present. No rhinorrhea. Right sinus exhibits no maxillary sinus tenderness and no frontal sinus tenderness. Left sinus exhibits no maxillary sinus tenderness and no frontal sinus tenderness.  Mouth/Throat: Uvula is midline and mucous membranes are normal. Posterior oropharyngeal edema and posterior oropharyngeal erythema present. No oropharyngeal exudate or tonsillar abscesses.       Left turbinate irritation/swelling  Eyes: Conjunctivae normal and EOM are normal. Pupils are equal, round, and reactive to light. No scleral icterus.   Neck: Normal range of motion. Neck supple.  Cardiovascular: Normal rate, regular rhythm, normal heart sounds and intact distal pulses.   No murmur heard. Pulmonary/Chest: Effort normal and breath sounds normal. No respiratory distress. She has no wheezes. She has no rales.  Lymphadenopathy:    She has no cervical adenopathy.  Skin: Skin is warm and dry. No rash noted.       Assessment & Plan:

## 2012-08-08 NOTE — Patient Instructions (Signed)
You have a sinus infection. Take medicine as prescribed: augmentin for 10 days Push fluids and plenty of rest. Nasal saline irrigation or neti pot to help drain sinuses. May use simple mucinex with plenty of fluid to help mobilize mucous. May use ibuprofen. Let us know if fever >101.5, trouble opening/closing mouth, difficulty swallowing, or worsening - you may need to be seen again.

## 2012-08-08 NOTE — Assessment & Plan Note (Signed)
Given duration and acute worsening over last 24 hours, will treat as acute bacterial sinusitis with course of augmentin. Pt agrees with plan.  Update [place] if worsening.

## 2013-03-06 ENCOUNTER — Encounter: Payer: Self-pay | Admitting: Family Medicine

## 2013-05-02 ENCOUNTER — Encounter: Payer: Self-pay | Admitting: Family Medicine

## 2013-05-02 ENCOUNTER — Ambulatory Visit (INDEPENDENT_AMBULATORY_CARE_PROVIDER_SITE_OTHER): Payer: BC Managed Care – PPO | Admitting: Family Medicine

## 2013-05-02 VITALS — BP 126/78 | HR 93 | Temp 98.7°F | Ht 66.0 in | Wt 138.8 lb

## 2013-05-02 DIAGNOSIS — J019 Acute sinusitis, unspecified: Secondary | ICD-10-CM

## 2013-05-02 DIAGNOSIS — B9689 Other specified bacterial agents as the cause of diseases classified elsewhere: Secondary | ICD-10-CM

## 2013-05-02 MED ORDER — AMOXICILLIN-POT CLAVULANATE 875-125 MG PO TABS: 1.0000 | ORAL_TABLET | Freq: Two times a day (BID) | ORAL | Status: DC

## 2013-05-02 NOTE — Progress Notes (Signed)
Subjective:    Patient ID: Jill Leonard, female    DOB: 09/19/1970, 42 y.o.   MRN: [id number]  HPI Here with symptoms of sinus infection Started 2 mo ago - after swimming under water- this irritated sinuses - and did it again last week Congestion worsened on Thursday and a bit of a cough  Pain is around both eyes symmetrically -not worse on one side  A lot of pressure in face- uses the netti pot  Has Sjorgrens- that gives her a lot of trouble with sinuses Uses claritin D- changed to mucinex D instead  Mucous from nose - yellow in color and blood  Cough is non prod  No fever thankfully   Has not needed her inhaler  Patient Active Problem List   Diagnosis Date Noted  . Sinusitis 06/12/2011  . SINUSITIS - ACUTE-NOS 09/29/2010  . ABDOMINAL PAIN, SUPRAPUBIC 03/04/2010  . NEPHROLITHIASIS, HX OF 03/04/2010  . DYSPNEA 01/24/2010  . TMJ PAIN 07/06/2008  . CHOLELITHIASIS 07/04/2008  . GERD 06/21/2008  . SJOGREN'S SYNDROME 06/21/2008  . NONSPEC ELEVATION OF LEVELS OF TRANSAMINASE/LDH 06/21/2008   Past Medical History  Diagnosis Date  . Anal fissure   . Depression   . Fibromyalgia   . GERD (gastroesophageal reflux disease)   . HLD (hyperlipidemia)   . History of nephrolithiasis   . Sjogren's syndrome   . Calculus of gallbladder without mention of cholecystitis or obstruction   . Arthralgia of temporomandibular joint   . Sjogren - Larsson's syndrome    Past Surgical History  Procedure Laterality Date  . Cholecystectomy  08/20/08   History  Substance Use Topics  . Smoking status: Never Smoker   . Smokeless tobacco: Never Used  . Alcohol Use: Yes     Comment: Socially-rare   Family History  Problem Relation Age of Onset  . Bone cancer      Grandmother  . Colon polyps Father   . Diabetes      Grandmother  . Heart disease      Grandmother   Allergies  Allergen Reactions  . Albuterol     REACTION: intolerant- "jittery" after use  . Sulfa Antibiotics   .  Sulfonamide Derivatives     REACTION: intolerant   Current Outpatient Prescriptions on File Prior to Visit  Medication Sig Dispense Refill  . cholecalciferol (VITAMIN D) 1000 UNITS tablet Take 1,000 Units by mouth daily.        . fluticasone (FLONASE) 50 MCG/ACT nasal spray 2 SPRAYS IN EACH NOSTRIL DAILY FOR SINUS AND ALLERGY SYMPTOMS  16 g  0  . ranitidine (ZANTAC) 150 MG tablet Take 150 mg by mouth 2 (two) times daily as needed.        . levalbuterol (XOPENEX HFA) 45 MCG/ACT inhaler Inhale 1-2 puffs into the lungs every 4 (four) hours as needed.         No current facility-administered medications on file prior to visit.     Review of Systems Review of Systems  Constitutional: Negative for fever, appetite change, fatigue and unexpected weight change.  Eyes: Negative for pain and visual disturbance.  ENT pos for congestion/ facial pain and ear pressure , neg for ear pain or drainage Respiratory: Negative for wheeze and shortness of breath. Pos for cough  Cardiovascular: Negative for cp or palpitations    Gastrointestinal: Negative for nausea, diarrhea and constipation.  Genitourinary: Negative for urgency and frequency.  Skin: Negative for pallor or rash   Neurological: Negative for weakness, light-headedness,  numbness and headaches.  Hematological: Negative for adenopathy. Does not bruise/bleed easily.  Psychiatric/Behavioral: Negative for dysphoric mood. The patient is not nervous/anxious.         Objective:   Physical Exam  Constitutional: She appears well-developed and well-nourished. No distress.  HENT:  Head: Normocephalic and atraumatic.  Right Ear: External ear normal.  Left Ear: External ear normal.  Mouth/Throat: Oropharynx is clear and moist. No oropharyngeal exudate.  Nares are injected and congested   Post nasal drip noted Bilateral maxillary sinus tenderness  Eyes: Conjunctivae and EOM are normal. Pupils are equal, round, and reactive to light. Right eye  exhibits no discharge. Left eye exhibits no discharge. No scleral icterus.  Neck: Normal range of motion. Neck supple.  Cardiovascular: Normal rate and regular rhythm.   Pulmonary/Chest: Effort normal and breath sounds normal. No respiratory distress. She has no wheezes. She has no rales.  Lymphadenopathy:    She has no cervical adenopathy.  Neurological: She is alert. No cranial nerve deficit.  Skin: Skin is warm and dry. No rash noted.  Psychiatric: She has a normal mood and affect.          Assessment & Plan:

## 2013-05-02 NOTE — Patient Instructions (Addendum)
For sinus infection-take the augmentin as directed Drink lots of water Use netti pot if helpful - and warm compresses on face/ hot showers  Update if not starting to improve in a week or if worsening

## 2013-05-02 NOTE — Assessment & Plan Note (Signed)
After 2 mo of sinus congestion followed by uri - with facial pain and malaise tx with augmentin Disc symptomatic care - see instructions on AVS  Warned about antihistamines with Sjogren- in light of mucosal dryness Update if not starting to improve in a week or if worsening

## 2013-09-21 ENCOUNTER — Telehealth: Payer: Self-pay | Admitting: Family Medicine

## 2013-09-21 NOTE — Telephone Encounter (Signed)
Unfortunately needs to be seen for abx to be called in.

## 2013-09-21 NOTE — Telephone Encounter (Signed)
Patient Information:  Caller Name: Malva  Phone: [phone]  Patient: Jill Leonard, Jill Leonard  Gender: Female  DOB: [birth year]  Age: 43 Years  PCP: Arnette Norris [facility])  Pregnant: No  Office Follow Up:  Does the office need to follow up with this patient?: Yes  Instructions For The Office: Rx antibiotic  RN Note:  Patient calling with C/O congestion and sinus pain.  Declines appointment stating "I'd like to avoid the office do to Flu epidemic." [facility]ing [phone].   Symptoms  Reason For Call & Symptoms: cold, congestion,  Reviewed Health History In EMR: Yes  Reviewed Medications In EMR: Yes  Reviewed Allergies In EMR: Yes  Reviewed Surgeries / Procedures: Yes  Date of Onset of Symptoms: 09/14/2013  Treatments Tried: Flonase  Treatments Tried Worked: No OB / GYN:  LMP: 09/18/2013  Guideline(s) Used:  Colds  Disposition Per Guideline:   See Today in Office  Reason For Disposition Reached:   Sinus pain (not just congestion) and fever  Advice Given:  Call Back If:  You become worse  Patient Refused Recommendation:  Patient Requests Prescription  Patient requesting RX for antibiotic to be called in.

## 2013-09-21 NOTE — Telephone Encounter (Signed)
Spoke to pt and advised per Dr Aron;pt scheduled to be seen 1/9 by Avie Echevaria

## 2013-09-22 ENCOUNTER — Ambulatory Visit (INDEPENDENT_AMBULATORY_CARE_PROVIDER_SITE_OTHER): Payer: BC Managed Care – PPO | Admitting: Internal Medicine

## 2013-09-22 ENCOUNTER — Encounter: Payer: Self-pay | Admitting: Internal Medicine

## 2013-09-22 VITALS — BP 120/74 | HR 95 | Temp 98.1°F | Wt 144.8 lb

## 2013-09-22 DIAGNOSIS — B9689 Other specified bacterial agents as the cause of diseases classified elsewhere: Secondary | ICD-10-CM

## 2013-09-22 DIAGNOSIS — J019 Acute sinusitis, unspecified: Secondary | ICD-10-CM

## 2013-09-22 MED ORDER — AMOXICILLIN-POT CLAVULANATE 875-125 MG PO TABS: 1.0000 | ORAL_TABLET | Freq: Two times a day (BID) | ORAL | Status: DC

## 2013-09-22 NOTE — Progress Notes (Signed)
Pre-visit discussion using our clinic review tool. No additional management support is needed unless otherwise documented below in the visit note.  

## 2013-09-22 NOTE — Patient Instructions (Signed)

## 2013-09-22 NOTE — Progress Notes (Signed)
HPI  Pt presents to the clinic today with c/o nasal congestion, ear fullness and facial pain. This started about 1-2 weeks ago. She also c/o headache. She is blowing thick mucous out her nose. She has noticed blood in it. She has used Flonase, Afrin, Mucinex, tylenol, saline spray without much relief. Her symptoms continues to get worse.  Review of Systems    Past Medical History  Diagnosis Date  . Anal fissure   . Depression   . Fibromyalgia   . GERD (gastroesophageal reflux disease)   . HLD (hyperlipidemia)   . History of nephrolithiasis   . Sjogren's syndrome   . Calculus of gallbladder without mention of cholecystitis or obstruction   . Arthralgia of temporomandibular joint   . Sjogren - Larsson's syndrome     Family History  Problem Relation Age of Onset  . Bone cancer      Grandmother  . Colon polyps Father   . Diabetes      Grandmother  . Heart disease      Grandmother    History   Social History  . Marital Status: Married    Spouse Name: N/A    Number of Children: N/A  . Years of Education: N/A   Occupational History  . Not on file.   Social History Main Topics  . Smoking status: Never Smoker   . Smokeless tobacco: Never Used  . Alcohol Use: Yes     Comment: Socially-rare  . Drug Use: No  . Sexual Activity: Not on file   Other Topics Concern  . Not on file   Social History Narrative   Married      Caffeine: 2 cups/day      No regular exercise          Allergies  Allergen Reactions  . Albuterol     REACTION: intolerant- "jittery" after use  . Sulfa Antibiotics   . Sulfonamide Derivatives     REACTION: intolerant     Constitutional: Positive headache, fatigue and fever. Denies abrupt weight changes.  HEENT:  Positive eye pain, pressure behind the eyes, facial pain, nasal congestion and sore throat. Denies eye redness, ear pain, ringing in the ears, wax buildup, runny nose or bloody nose. Respiratory: Positive cough and thick green  sputum production. Denies difficulty breathing or shortness of breath.  Cardiovascular: Denies chest pain, chest tightness, palpitations or swelling in the hands or feet.   No other specific complaints in a complete review of systems (except as listed in HPI above).  Objective:    BP 120/74  Pulse 95  Temp(Src) 98.1 F (36.7 C) (Oral)  Wt 144 lb 12 oz (65.658 kg)  SpO2 98% Wt Readings from Last 3 Encounters:  09/22/13 144 lb 12 oz (65.658 kg)  05/02/13 138 lb 12 oz (62.937 kg)  08/08/12 141 lb 8 oz (64.184 kg)    General: Appears her stated age, well developed, well nourished in NAD. HEENT: Head: normal shape and size, maxillary sinus tenderness; Eyes: sclera white, no icterus, conjunctiva pink, PERRLA and EOMs intact; Ears: Tm's gray and intact, normal light reflex; Nose: mucosa pink and moist, septum midline; Throat/Mouth: + PND. Teeth present, mucosa pink and moist, no exudate noted, no lesions or ulcerations noted.  Neck: Mild cervical lymphadenopathy. Neck supple, trachea midline. No massses, lumps or thyromegaly present.  Cardiovascular: Normal rate and rhythm. S1,S2 noted.  No murmur, rubs or gallops noted. No JVD or BLE edema. No carotid bruits noted. Pulmonary/Chest: Normal effort and  positive vesicular breath sounds. No respiratory distress. No wheezes, rales or ronchi noted.      Assessment & Plan:   Acute bacterial sinusitis  Can use a Neti Pot which can be purchased from your local drug store. Flonase 2 sprays each nostril for 3 days and then as needed. Augmentin BID for 10 days  RTC as needed or if symptoms persist.

## 2013-10-04 ENCOUNTER — Ambulatory Visit (INDEPENDENT_AMBULATORY_CARE_PROVIDER_SITE_OTHER): Payer: BC Managed Care – PPO | Admitting: Family Medicine

## 2013-10-04 ENCOUNTER — Encounter: Payer: Self-pay | Admitting: Family Medicine

## 2013-10-04 VITALS — BP 104/74 | HR 86 | Temp 98.8°F | Wt 146.0 lb

## 2013-10-04 DIAGNOSIS — R059 Cough, unspecified: Secondary | ICD-10-CM

## 2013-10-04 DIAGNOSIS — R05 Cough: Secondary | ICD-10-CM | POA: Insufficient documentation

## 2013-10-04 MED ORDER — AZITHROMYCIN 250 MG PO TABS: ORAL_TABLET | ORAL | Status: DC

## 2013-10-04 NOTE — Patient Instructions (Signed)
Start the antibiotics today and use the xopenex sparingly (1 puff at a time). Take care.

## 2013-10-04 NOTE — Progress Notes (Signed)
Pre-visit discussion using our clinic review tool. No additional management support is needed unless otherwise documented below in the visit note.  Prev seen.  Sx wax and wane in the meantime, during and after abx.  Overall never fully resolved.  Rhinorrhea, not discolored. Felt weak, feverish.  No vomiting, no diarrhea.  Some cough, more recently.  Chest is sore, cough worse with laying down.  "Knot" under the R ear for the last 14 days, tender.  This doesn't feel like prev influenze illnesses years ago.  Mult sick contacts.   Meds, vitals, and allergies reviewed.   ROS: See HPI.  Otherwise, noncontributory.  GEN: nad, alert and oriented HEENT: mucous membranes moist, tm w/o erythema, nasal exam w/o erythema, clear discharge noted,  OP with cobblestoning, sinuses not ttp, small LN inferior to the R pinna but no other LA on exam NECK: supple w/o LA CV: rrr.   PULM: ctab, no inc wob EXT: no edema SKIN: no acute rash

## 2013-10-04 NOTE — Assessment & Plan Note (Signed)
Presumed bronchitis, would start zmax to cover for potential atypical pathogen. Unclear how much Sjogren's affects the presentation. She agrees.  Nontoxic.  F/u prn.

## 2014-01-24 ENCOUNTER — Encounter: Payer: BC Managed Care – PPO | Admitting: Family Medicine

## 2014-02-08 ENCOUNTER — Encounter: Payer: Self-pay | Admitting: Family Medicine

## 2014-02-08 ENCOUNTER — Other Ambulatory Visit (HOSPITAL_COMMUNITY)
Admission: RE | Admit: 2014-02-08 | Discharge: 2014-02-08 | Disposition: A | Discharge status: Home / Self Care | Payer: BC Managed Care – PPO | Source: Ambulatory Visit | Attending: Family Medicine | Admitting: Family Medicine

## 2014-02-08 ENCOUNTER — Ambulatory Visit (INDEPENDENT_AMBULATORY_CARE_PROVIDER_SITE_OTHER): Payer: BC Managed Care – PPO | Admitting: Family Medicine

## 2014-02-08 VITALS — BP 108/66 | HR 83 | Temp 98.4°F | Ht 66.5 in | Wt 139.8 lb

## 2014-02-08 DIAGNOSIS — J019 Acute sinusitis, unspecified: Secondary | ICD-10-CM

## 2014-02-08 DIAGNOSIS — B9689 Other specified bacterial agents as the cause of diseases classified elsewhere: Secondary | ICD-10-CM

## 2014-02-08 DIAGNOSIS — M3500 Sicca syndrome, unspecified: Secondary | ICD-10-CM

## 2014-02-08 DIAGNOSIS — J069 Acute upper respiratory infection, unspecified: Secondary | ICD-10-CM

## 2014-02-08 DIAGNOSIS — Z136 Encounter for screening for cardiovascular disorders: Secondary | ICD-10-CM

## 2014-02-08 DIAGNOSIS — K219 Gastro-esophageal reflux disease without esophagitis: Secondary | ICD-10-CM

## 2014-02-08 DIAGNOSIS — R599 Enlarged lymph nodes, unspecified: Secondary | ICD-10-CM

## 2014-02-08 DIAGNOSIS — Z01419 Encounter for gynecological examination (general) (routine) without abnormal findings: Secondary | ICD-10-CM

## 2014-02-08 DIAGNOSIS — R109 Unspecified abdominal pain: Secondary | ICD-10-CM | POA: Insufficient documentation

## 2014-02-08 DIAGNOSIS — Z0000 Encounter for general adult medical examination without abnormal findings: Secondary | ICD-10-CM

## 2014-02-08 DIAGNOSIS — Z1151 Encounter for screening for human papillomavirus (HPV): Secondary | ICD-10-CM | POA: Insufficient documentation

## 2014-02-08 DIAGNOSIS — Z124 Encounter for screening for malignant neoplasm of cervix: Secondary | ICD-10-CM | POA: Insufficient documentation

## 2014-02-08 LAB — CBC WITH DIFFERENTIAL/PLATELET
BASOS ABS: 0 10*3/uL (ref 0.0–0.1)
Basophils Relative: 0.5 % (ref 0.0–3.0)
EOS ABS: 0.1 10*3/uL (ref 0.0–0.7)
Eosinophils Relative: 2 % (ref 0.0–5.0)
HCT: 43.5 % (ref 36.0–46.0)
Hemoglobin: 14.9 g/dL (ref 12.0–15.0)
LYMPHS PCT: 22.9 % (ref 12.0–46.0)
Lymphs Abs: 1.2 10*3/uL (ref 0.7–4.0)
MCHC: 34.1 g/dL (ref 30.0–36.0)
MCV: 83.4 fl (ref 78.0–100.0)
MONOS PCT: 7 % (ref 3.0–12.0)
Monocytes Absolute: 0.4 10*3/uL (ref 0.1–1.0)
NEUTROS PCT: 67.6 % (ref 43.0–77.0)
Neutro Abs: 3.6 10*3/uL (ref 1.4–7.7)
Platelets: 257 10*3/uL (ref 150.0–400.0)
RBC: 5.22 Mil/uL — ABNORMAL HIGH (ref 3.87–5.11)
RDW: 14 % (ref 11.5–15.5)
WBC: 5.4 10*3/uL (ref 4.0–10.5)

## 2014-02-08 LAB — COMPREHENSIVE METABOLIC PANEL
ALT: 18 U/L (ref 0–35)
AST: 24 U/L (ref 0–37)
Albumin: 4.2 g/dL (ref 3.5–5.2)
Alkaline Phosphatase: 56 U/L (ref 39–117)
BUN: 10 mg/dL (ref 6–23)
CALCIUM: 9 mg/dL (ref 8.4–10.5)
CHLORIDE: 103 meq/L (ref 96–112)
CO2: 27 meq/L (ref 19–32)
CREATININE: 0.8 mg/dL (ref 0.4–1.2)
GFR: 83.34 mL/min (ref >60.00)
GLUCOSE: 79 mg/dL (ref 70–99)
Potassium: 3.8 mEq/L (ref 3.5–5.1)
SODIUM: 137 meq/L (ref 135–145)
TOTAL PROTEIN: 8.4 g/dL — AB (ref 6.0–8.3)
Total Bilirubin: 0.4 mg/dL (ref 0.2–1.2)

## 2014-02-08 LAB — LIPID PANEL
CHOLESTEROL: 184 mg/dL (ref 0–200)
HDL: 40.8 mg/dL (ref >39.00)
LDL Cholesterol: 132 mg/dL — ABNORMAL HIGH (ref 0–99)
TRIGLYCERIDES: 58 mg/dL (ref 0.0–149.0)
Total CHOL/HDL Ratio: 5
VLDL: 11.6 mg/dL (ref 0.0–40.0)

## 2014-02-08 LAB — TSH: TSH: 0.95 u[IU]/mL (ref 0.35–4.50)

## 2014-02-08 MED ORDER — OMEPRAZOLE 20 MG PO CPDR: 20.0000 mg | DELAYED_RELEASE_CAPSULE | Freq: Every day | ORAL | Status: DC

## 2014-02-08 MED ORDER — AMOXICILLIN-POT CLAVULANATE 875-125 MG PO TABS: 1.0000 | ORAL_TABLET | Freq: Two times a day (BID) | ORAL | Status: DC

## 2014-02-08 NOTE — Progress Notes (Signed)
Subjective:   Patient ID: Jill Leonard, female    DOB: [birth year], 43 y.o.   MRN: [id number]  Jill Leonard is a pleasant 43 y.o. year old female who presents to clinic today with Annual Exam and Nasal Congestion  on 02/08/2014  HPI: H/o sjorgren's. [id number]- 79 year old son. Mammogram 03/05/13.  GERD- taking Raninditine but still having symptoms of reflux.  Wakes her up at night when her stomach is empty.  Due for pap smear today. Not currently sexually active.  Married but not sexually active.  Did have some abdominal bloating 3 months ago after her period, but that has resolved.  URI- has had runny nose, congestion, sinus pressure for over a week. Taking Zyrtec D and flonase.  No CP or SOB.  Right ?swollen gland- has been there since January.  Can be painful when she touches it.  Does not think it is growing in size.   Patient Active Problem List   Diagnosis Date Noted  . Routine gynecological examination 02/08/2014  . Routine general medical examination at a health care facility 02/08/2014  . Cough 10/04/2013  . Acute bacterial sinusitis 05/02/2013  . ABDOMINAL PAIN, SUPRAPUBIC 03/04/2010  . NEPHROLITHIASIS, HX OF 03/04/2010  . DYSPNEA 01/24/2010  . TMJ PAIN 07/06/2008  . CHOLELITHIASIS 07/04/2008  . GERD 06/21/2008  . [place] SYNDROME 06/21/2008  . [place] ELEVATION OF LEVELS OF TRANSAMINASE/LDH 06/21/2008   Past Medical History  Diagnosis Date  . Anal fissure   . Depression   . Fibromyalgia   . GERD (gastroesophageal reflux disease)   . HLD (hyperlipidemia)   . History of nephrolithiasis   . Sjogren's syndrome   . Calculus of gallbladder without mention of cholecystitis or obstruction   . Arthralgia of temporomandibular joint   . Sjogren - Larsson's syndrome    Past Surgical History  Procedure Laterality Date  . Cholecystectomy  08/20/08   History  Substance Use Topics  . Smoking status: Never Smoker   . Smokeless tobacco: Never Used  . Alcohol  Use: Yes     Comment: Socially-rare   Family History  Problem Relation Age of Onset  . Bone cancer      Grandmother  . Colon polyps Father   . Diabetes      Grandmother  . Heart disease      Grandmother   Allergies  Allergen Reactions  . Albuterol     REACTION: intolerant- "jittery" after use  . Sulfa Antibiotics   . Sulfonamide Derivatives     REACTION: intolerant   Current Outpatient Prescriptions on File Prior to Visit  Medication Sig Dispense Refill  . cholecalciferol (VITAMIN D) 1000 UNITS tablet Take 1,000 Units by mouth daily.        . fluticasone (FLONASE) 50 MCG/ACT nasal spray 2 SPRAYS IN EACH NOSTRIL DAILY FOR SINUS AND ALLERGY SYMPTOMS  16 g  0  . levalbuterol (XOPENEX HFA) 45 MCG/ACT inhaler Inhale 1-2 puffs into the lungs every 4 (four) hours as needed.        . ranitidine (ZANTAC) 150 MG tablet Take 150 mg by mouth 2 (two) times daily as needed.         No current facility-administered medications on file prior to visit.   The PMH, PSH, Social History, Family History, Medications, and allergies have been reviewed in [facility], and have been updated if relevant.   Review of Systems + increased frequency BMs +GERD No blood in stool No CP or SOB No fevers, no  chills +heavier menstrual periods No dysuria or vaginal discharge No difficulty swallowing     Objective:    BP 108/66  Pulse 83  Temp(Src) 98.4 F (36.9 C) (Oral)  Ht 5' 6.5" (1.689 m)  Wt 139 lb 12 oz (63.39 kg)  BMI 22.22 kg/m2  SpO2 91%  LMP 01/19/2014   Physical Exam   General:  Well-developed,well-nourished,in no acute distress; alert,appropriate and cooperative throughout examination Head:  normocephalic and atraumatic.   Right sided- ? Very small palpable node under right ear, TTP Eyes:  vision grossly intact, pupils equal, pupils round, and pupils reactive to light.   Ears:  R ear normal and L ear normal.   Nose:  no external deformity.   + mucosal erythema, frontal sinuses TTP  bilaterally Mouth:  good dentition.    +PND Neck:  No deformities, masses, or tenderness noted. Breasts:  No mass, nodules, thickening, tenderness, bulging, retraction, inflamation, nipple discharge or skin changes noted.   Lungs:  Normal respiratory effort, chest expands symmetrically. Lungs are clear to auscultation, no crackles or wheezes. Heart:  Normal rate and regular rhythm. S1 and S2 normal without gallop, murmur, click, rub or other extra sounds. Abdomen:  Bowel sounds positive,abdomen soft and non-tender without masses, organomegaly or hernias noted. Rectal:  no external abnormalities.   Genitalia:  Pelvic Exam:        External: normal female genitalia without lesions or masses        Vagina: normal without lesions or masses        Cervix: normal without lesions or masses        Adnexa: normal bimanual exam without masses or fullness        Uterus: normal by palpation        Pap smear: performed Msk:  No deformity or scoliosis noted of thoracic or lumbar spine.   Extremities:  No clubbing, cyanosis, edema, or deformity noted with normal full range of motion of all joints.   Neurologic:  alert & oriented X3 and gait normal.   Skin:  Intact without suspicious lesions or rashes Cervical Nodes:  No lymphadenopathy noted Axillary Nodes:  No palpable lymphadenopathy Psych:  Cognition and judgment appear intact. Alert and cooperative with normal attention span and concentration. No apparent delusions, illusions, hallucinations       Assessment & Plan:   Routine gynecological examination  Routine general medical examination at a health care facility No Follow-up on file.

## 2014-02-08 NOTE — Assessment & Plan Note (Signed)
Pap smear today. She will schedule mammogram next month.

## 2014-02-08 NOTE — Assessment & Plan Note (Signed)
Feel more like a swollen lymph node to me - slightly- but given duration of symptoms and h/o sjorgrens, will get ultrasound for further evaluation. The patient indicates understanding of these issues and agrees with the plan.

## 2014-02-08 NOTE — Assessment & Plan Note (Signed)
Given duration and progression of symptoms, will treat for bacterial sinusitis with augmentin. Continue supportive care with zyrtec d, flonase

## 2014-02-08 NOTE — Assessment & Plan Note (Signed)
Resolved.  Pelvic US if symptoms recurr.

## 2014-02-08 NOTE — Progress Notes (Signed)
Pre visit review using our clinic review tool, if applicable. No additional management support is needed unless otherwise documented below in the visit note. 

## 2014-02-08 NOTE — Assessment & Plan Note (Signed)
Reviewed preventive care protocols, scheduled due services, and updated immunizations Discussed nutrition, exercise, diet, and healthy lifestyle.  

## 2014-02-08 NOTE — Patient Instructions (Addendum)
Good to see you. Take Augmentin as directed. Continue your Zyrtec and flonase.  Start prilosec 20 mg before bedtime.  Schedule your mammogram for June.  Please stop by to see Rosaria Ferries on your way out after you go to the lab.

## 2014-02-12 ENCOUNTER — Ambulatory Visit (HOSPITAL_BASED_OUTPATIENT_CLINIC_OR_DEPARTMENT_OTHER)
Admission: RE | Admit: 2014-02-12 | Discharge: 2014-02-12 | Disposition: A | Discharge status: Home / Self Care | Payer: BC Managed Care – PPO | Source: Ambulatory Visit | Attending: Family Medicine | Admitting: Family Medicine

## 2014-02-12 DIAGNOSIS — R599 Enlarged lymph nodes, unspecified: Secondary | ICD-10-CM

## 2014-02-14 ENCOUNTER — Encounter: Payer: Self-pay | Admitting: *Deleted

## 2014-02-14 ENCOUNTER — Telehealth: Payer: Self-pay | Admitting: Family Medicine

## 2014-02-14 NOTE — Telephone Encounter (Signed)
Patient returned your call.

## 2014-02-14 NOTE — Telephone Encounter (Signed)
error    This encounter was created in error - please disregard.

## 2014-02-14 NOTE — Telephone Encounter (Signed)
Spoke to pt and informed her of imaging results. LAb results mailed at her request

## 2014-04-16 ENCOUNTER — Encounter: Payer: Self-pay | Admitting: Family Medicine

## 2014-04-16 ENCOUNTER — Ambulatory Visit (INDEPENDENT_AMBULATORY_CARE_PROVIDER_SITE_OTHER): Payer: BC Managed Care – PPO | Admitting: Family Medicine

## 2014-04-16 VITALS — BP 108/78 | HR 79 | Temp 98.6°F | Ht 66.5 in | Wt 144.5 lb

## 2014-04-16 DIAGNOSIS — J069 Acute upper respiratory infection, unspecified: Secondary | ICD-10-CM | POA: Insufficient documentation

## 2014-04-16 DIAGNOSIS — B9789 Other viral agents as the cause of diseases classified elsewhere: Principal | ICD-10-CM

## 2014-04-16 MED ORDER — AMOXICILLIN-POT CLAVULANATE 875-125 MG PO TABS: 1.0000 | ORAL_TABLET | Freq: Two times a day (BID) | ORAL | Status: DC

## 2014-04-16 NOTE — Assessment & Plan Note (Signed)
In patient with hx of frequent sinusitis -afraid she will get this on vacation Did px augmentin to hold - will fill if sinus pain/ fever Disc symptomatic care - see instructions on AVS  Will use nasal saline/ tylenol prn and try to rest  Update if not starting to improve in a week or if worsening

## 2014-04-16 NOTE — Progress Notes (Signed)
Pre visit review using our clinic review tool, if applicable. No additional management support is needed unless otherwise documented below in the visit note. 

## 2014-04-16 NOTE — Progress Notes (Signed)
Subjective:    Patient ID: Jill Leonard, female    DOB: 07-10-1971, 43 y.o.   MRN: [id number]  HPI Here with uri symptoms  Husband has been sick recently -going around the family   Feels fatigued and generally weak  Congestion  Kind of achey- and her hands hurt   She is going to the beach on Wed   Mucous is a little yellow  Cough is mostly dry -some hoarseness   Started on Thursday- got some better and then worse  Some sinus pressure    Woke up during the night -sweating  Took some tylenol before bed   Patient Active Problem List   Diagnosis Date Noted  . Routine gynecological examination 02/08/2014  . Routine general medical examination at a health care facility 02/08/2014  . Abdominal pain, unspecified site 02/08/2014  . Acute upper respiratory infections of unspecified site 02/08/2014  . Swollen gland 02/08/2014  . ABDOMINAL PAIN, SUPRAPUBIC 03/04/2010  . NEPHROLITHIASIS, HX OF 03/04/2010  . DYSPNEA 01/24/2010  . TMJ PAIN 07/06/2008  . CHOLELITHIASIS 07/04/2008  . GERD 06/21/2008  . [place] SYNDROME 06/21/2008  . [place] ELEVATION OF LEVELS OF TRANSAMINASE/LDH 06/21/2008   Past Medical History  Diagnosis Date  . Anal fissure   . Depression   . Fibromyalgia   . GERD (gastroesophageal reflux disease)   . HLD (hyperlipidemia)   . History of nephrolithiasis   . Sjogren's syndrome   . Calculus of gallbladder without mention of cholecystitis or obstruction   . Arthralgia of temporomandibular joint   . Sjogren - Larsson's syndrome    Past Surgical History  Procedure Laterality Date  . Cholecystectomy  08/20/08   History  Substance Use Topics  . Smoking status: Never Smoker   . Smokeless tobacco: Never Used  . Alcohol Use: Yes     Comment: Socially-rare   Family History  Problem Relation Age of Onset  . Bone cancer      Grandmother  . Colon polyps Father   . Diabetes      Grandmother  . Heart disease      Grandmother   Allergies  Allergen  Reactions  . Albuterol     REACTION: intolerant- "jittery" after use  . Sulfa Antibiotics   . Sulfonamide Derivatives     REACTION: intolerant   Current Outpatient Prescriptions on File Prior to Visit  Medication Sig Dispense Refill  . cholecalciferol (VITAMIN D) 1000 UNITS tablet Take 1,000 Units by mouth daily.        . fluticasone (FLONASE) 50 MCG/ACT nasal spray 2 SPRAYS IN EACH NOSTRIL DAILY FOR SINUS AND ALLERGY SYMPTOMS  16 g  0  . levalbuterol (XOPENEX HFA) 45 MCG/ACT inhaler Inhale 1-2 puffs into the lungs every 4 (four) hours as needed.        Marland Kitchen omeprazole (PRILOSEC) 20 MG capsule Take 1 capsule (20 mg total) by mouth daily.  30 capsule  3  . ranitidine (ZANTAC) 150 MG tablet Take 150 mg by mouth 2 (two) times daily as needed.         No current facility-administered medications on file prior to visit.     Review of Systems Review of Systems  Constitutional: Negative for fever, appetite change, and unexpected weight change.  ENT pos for congestion/ drip and sinus pressure/ neg for ST Eyes: Negative for pain and visual disturbance.  Respiratory: Negative for wheeze and shortness of breath.   Cardiovascular: Negative for cp or palpitations    Gastrointestinal: Negative  for nausea, diarrhea and constipation.  Genitourinary: Negative for urgency and frequency.  Skin: Negative for pallor or rash   Neurological: Negative for weakness, light-headedness, numbness and headaches.  Hematological: Negative for adenopathy. Does not bruise/bleed easily.  Psychiatric/Behavioral: Negative for dysphoric mood. The patient is not nervous/anxious.         Objective:   Physical Exam  Constitutional: She appears well-developed and well-nourished. No distress.  HENT:  Head: Normocephalic and atraumatic.  Right Ear: External ear normal.  Left Ear: External ear normal.  Mouth/Throat: Oropharynx is clear and moist. No oropharyngeal exudate.  Nares are injected and congested  Mild maxillary  sinus tenderness Clear post nasal drip   Eyes: Conjunctivae and EOM are normal. Pupils are equal, round, and reactive to light. Right eye exhibits no discharge. Left eye exhibits no discharge.  Neck: Normal range of motion. Neck supple.  Cardiovascular: Normal rate and regular rhythm.   Pulmonary/Chest: Effort normal and breath sounds normal. No respiratory distress. She has no wheezes. She has no rales.  Harsh bs  Hacking cough  Lymphadenopathy:    She has no cervical adenopathy.  Neurological: She is alert.  Skin: Skin is warm and dry. No rash noted. No erythema.  Psychiatric: She has a normal mood and affect.          Assessment & Plan:   Problem List Items Addressed This Visit     Respiratory   Viral URI with cough - Primary     In patient with hx of frequent sinusitis -afraid she will get this on vacation Did px augmentin to hold - will fill if sinus pain/ fever Disc symptomatic care - see instructions on AVS  Will use nasal saline/ tylenol prn and try to rest  Update if not starting to improve in a week or if worsening

## 2014-04-16 NOTE — Patient Instructions (Signed)
I think you have a viral upper respiratory infection  For fever /chills/aches - you can take acetaminophen up to every 4 hours  Drink lots of fluids  Use nasal saline as often as you can / and breathe steam or use a humidifier  Get rest if you can  If symptoms worsen -especially sinus pain -take the augmentin  Update if not starting to improve in a week or if worsening

## 2014-06-05 ENCOUNTER — Telehealth: Payer: Self-pay

## 2014-06-05 NOTE — Telephone Encounter (Signed)
Form completed and placed in Dr Hulen Shouts inbox for signature

## 2014-06-05 NOTE — Telephone Encounter (Signed)
Pt request biometric screening form for ins be completed; pt had physical on 02/08/14. pt request cb when completed. Pt will fax form to [facility] attention to fax # [phone]. Pt works in [place] and cannot bring form to office.

## 2014-06-06 NOTE — Telephone Encounter (Signed)
Spoke to pt and informed her paperwork has been completed. Faxed to her, at her request and copy sent for scanning

## 2014-06-07 NOTE — Telephone Encounter (Signed)
Pt left v/m requesting cb about question on form that was completed.

## 2014-06-07 NOTE — Telephone Encounter (Signed)
Spoke to pt who states that this message was from 06/06/14

## 2014-06-26 ENCOUNTER — Encounter: Payer: Self-pay | Admitting: Family Medicine

## 2014-09-15 ENCOUNTER — Encounter (HOSPITAL_COMMUNITY): Payer: Self-pay | Admitting: Emergency Medicine

## 2014-09-15 ENCOUNTER — Emergency Department (HOSPITAL_COMMUNITY)
Admission: EM | Admit: 2014-09-15 | Discharge: 2014-09-15 | Disposition: A | Discharge status: Nursing Home (Includes ICF) | Payer: BC Managed Care – PPO | Source: Home / Self Care | Attending: Family Medicine | Admitting: Family Medicine

## 2014-09-15 ENCOUNTER — Emergency Department (HOSPITAL_COMMUNITY)
Admission: EM | Admit: 2014-09-15 | Discharge: 2014-09-15 | Disposition: A | Discharge status: Home / Self Care | Payer: BLUE CROSS/BLUE SHIELD | Attending: Emergency Medicine | Admitting: Emergency Medicine

## 2014-09-15 ENCOUNTER — Emergency Department (HOSPITAL_COMMUNITY): Payer: BLUE CROSS/BLUE SHIELD

## 2014-09-15 DIAGNOSIS — Z79899 Other long term (current) drug therapy: Secondary | ICD-10-CM | POA: Diagnosis not present

## 2014-09-15 DIAGNOSIS — Z8659 Personal history of other mental and behavioral disorders: Secondary | ICD-10-CM | POA: Diagnosis not present

## 2014-09-15 DIAGNOSIS — Z8639 Personal history of other endocrine, nutritional and metabolic disease: Secondary | ICD-10-CM | POA: Diagnosis not present

## 2014-09-15 DIAGNOSIS — R350 Frequency of micturition: Secondary | ICD-10-CM | POA: Diagnosis present

## 2014-09-15 DIAGNOSIS — R9431 Abnormal electrocardiogram [ECG] [EKG]: Secondary | ICD-10-CM

## 2014-09-15 DIAGNOSIS — Z8739 Personal history of other diseases of the musculoskeletal system and connective tissue: Secondary | ICD-10-CM | POA: Insufficient documentation

## 2014-09-15 DIAGNOSIS — Z7951 Long term (current) use of inhaled steroids: Secondary | ICD-10-CM | POA: Diagnosis not present

## 2014-09-15 DIAGNOSIS — N390 Urinary tract infection, site not specified: Secondary | ICD-10-CM | POA: Insufficient documentation

## 2014-09-15 DIAGNOSIS — Z792 Long term (current) use of antibiotics: Secondary | ICD-10-CM | POA: Insufficient documentation

## 2014-09-15 DIAGNOSIS — Z8719 Personal history of other diseases of the digestive system: Secondary | ICD-10-CM | POA: Insufficient documentation

## 2014-09-15 DIAGNOSIS — R000 Tachycardia, unspecified: Secondary | ICD-10-CM

## 2014-09-15 DIAGNOSIS — Z87442 Personal history of urinary calculi: Secondary | ICD-10-CM | POA: Diagnosis not present

## 2014-09-15 DIAGNOSIS — J069 Acute upper respiratory infection, unspecified: Secondary | ICD-10-CM | POA: Diagnosis not present

## 2014-09-15 DIAGNOSIS — Z3202 Encounter for pregnancy test, result negative: Secondary | ICD-10-CM | POA: Insufficient documentation

## 2014-09-15 DIAGNOSIS — R059 Cough, unspecified: Secondary | ICD-10-CM

## 2014-09-15 DIAGNOSIS — R05 Cough: Secondary | ICD-10-CM

## 2014-09-15 LAB — CBC WITH DIFFERENTIAL/PLATELET
Basophils Absolute: 0 10*3/uL (ref 0.0–0.1)
Basophils Relative: 0 % (ref 0–1)
EOS PCT: 0 % (ref 0–5)
Eosinophils Absolute: 0 10*3/uL (ref 0.0–0.7)
HCT: 43.8 % (ref 36.0–46.0)
Hemoglobin: 14.7 g/dL (ref 12.0–15.0)
Lymphocytes Relative: 15 % (ref 12–46)
Lymphs Abs: 1.6 10*3/uL (ref 0.7–4.0)
MCH: 28.1 pg (ref 26.0–34.0)
MCHC: 33.6 g/dL (ref 30.0–36.0)
MCV: 83.6 fL (ref 78.0–100.0)
Monocytes Absolute: 0.6 10*3/uL (ref 0.1–1.0)
Monocytes Relative: 5 % (ref 3–12)
NEUTROS ABS: 8.9 10*3/uL — AB (ref 1.7–7.7)
Neutrophils Relative %: 80 % — ABNORMAL HIGH (ref 43–77)
PLATELETS: 255 10*3/uL (ref 150–400)
RBC: 5.24 MIL/uL — ABNORMAL HIGH (ref 3.87–5.11)
RDW: 13.7 % (ref 11.5–15.5)
WBC: 11.1 10*3/uL — ABNORMAL HIGH (ref 4.0–10.5)

## 2014-09-15 LAB — COMPREHENSIVE METABOLIC PANEL
ALK PHOS: 86 U/L (ref 39–117)
ALT: 75 U/L — AB (ref 0–35)
AST: 66 U/L — ABNORMAL HIGH (ref 0–37)
Albumin: 4.2 g/dL (ref 3.5–5.2)
Anion gap: 8 (ref 5–15)
BILIRUBIN TOTAL: 0.7 mg/dL (ref 0.3–1.2)
CALCIUM: 9.2 mg/dL (ref 8.4–10.5)
CO2: 27 mmol/L (ref 19–32)
CREATININE: 0.7 mg/dL (ref 0.50–1.10)
Chloride: 103 mEq/L (ref 96–112)
GLUCOSE: 85 mg/dL (ref 70–99)
POTASSIUM: 3.8 mmol/L (ref 3.5–5.1)
Sodium: 138 mmol/L (ref 135–145)
Total Protein: 9.1 g/dL — ABNORMAL HIGH (ref 6.0–8.3)

## 2014-09-15 LAB — POCT URINALYSIS DIP (DEVICE)
Bilirubin Urine: NEGATIVE
Glucose, UA: NEGATIVE mg/dL
KETONES UR: NEGATIVE mg/dL
Nitrite: NEGATIVE
PH: 6 (ref 5.0–8.0)
PROTEIN: NEGATIVE mg/dL
SPECIFIC GRAVITY, URINE: 1.01 (ref 1.005–1.030)
Urobilinogen, UA: 0.2 mg/dL (ref 0.0–1.0)

## 2014-09-15 LAB — POC URINE PREG, ED: Preg Test, Ur: NEGATIVE

## 2014-09-15 LAB — POCT RAPID STREP A: Streptococcus, Group A Screen (Direct): NEGATIVE

## 2014-09-15 LAB — URINALYSIS, ROUTINE W REFLEX MICROSCOPIC
BILIRUBIN URINE: NEGATIVE
GLUCOSE, UA: NEGATIVE mg/dL
Nitrite: NEGATIVE
Protein, ur: NEGATIVE mg/dL
SPECIFIC GRAVITY, URINE: 1.015 (ref 1.005–1.030)
Urobilinogen, UA: 0.2 mg/dL (ref 0.0–1.0)
pH: 6 (ref 5.0–8.0)

## 2014-09-15 LAB — I-STAT TROPONIN, ED: TROPONIN I, POC: 0 ng/mL (ref 0.00–0.08)

## 2014-09-15 LAB — URINE MICROSCOPIC-ADD ON

## 2014-09-15 MED ORDER — DEXTROSE 5 % IV SOLN
1.0000 g | Freq: Once | INTRAVENOUS | Status: AC
  Administered 2014-09-15: 1 g via INTRAVENOUS
  Filled 2014-09-15: qty 10

## 2014-09-15 MED ORDER — BUTALBITAL-APAP-CAFFEINE 50-325-40 MG PO TABS
1.0000 | ORAL_TABLET | ORAL | Status: AC
  Administered 2014-09-15: 1 via ORAL
  Filled 2014-09-15: qty 1

## 2014-09-15 MED ORDER — SODIUM CHLORIDE 0.9 % IV BOLUS (SEPSIS)
1000.0000 mL | INTRAVENOUS | Status: AC
  Administered 2014-09-15: 1000 mL via INTRAVENOUS

## 2014-09-15 MED ORDER — CIPROFLOXACIN HCL 500 MG PO TABS: 500.0000 mg | ORAL_TABLET | Freq: Two times a day (BID) | ORAL | Status: DC

## 2014-09-15 MED ORDER — ACETAMINOPHEN 325 MG PO TABS
650.0000 mg | ORAL_TABLET | Freq: Once | ORAL | Status: AC
  Administered 2014-09-15: 650 mg via ORAL
  Filled 2014-09-15: qty 2

## 2014-09-15 MED ORDER — SODIUM CHLORIDE 0.9 % IV BOLUS (SEPSIS): 1000.0000 mL | Freq: Once | INTRAVENOUS | Status: DC

## 2014-09-15 NOTE — ED Notes (Signed)
Pt comfortable with discharge and follow up instructions. Pt declines wheelchair, escorted to waiting area by this RN. Prescriptions x1. 

## 2014-09-15 NOTE — ED Notes (Signed)
C/o cold sx onset 2 days Sx include: fevers, HA, BA, congestin, ST, cough Taking tyle w/no relief Alert, no signs of acute distress

## 2014-09-15 NOTE — ED Notes (Signed)
Pt declined shuttle; went to ER by pv

## 2014-09-15 NOTE — ED Notes (Signed)
Pt sent from Urgent care for further eval of fever, sore throat, cough, back pain and urinary frequency.

## 2014-09-15 NOTE — ED Provider Notes (Signed)
CSN: [id number]     Arrival date & time 09/15/14  1231 History   First MD Initiated Contact with Patient 09/15/14 1514     Chief Complaint  Patient presents with  . Fever  . Sore Throat  . Urinary Frequency     (Consider location/radiation/quality/duration/timing/severity/associated sxs/prior Treatment) Patient is a 44 y.o. female presenting with fever, pharyngitis, and frequency. The history is provided by the patient.  Fever Associated symptoms: cough (mild) and sore throat   Associated symptoms: no chest pain, no congestion, no diarrhea, no dysuria, no headaches, no nausea and no vomiting   Cough:    Cough characteristics:  Productive   Sputum characteristics:  Nondescript   Severity:  Mild   Onset quality:  Gradual   Duration:  2 days   Timing:  Intermittent   Progression:  Unchanged   Chronicity:  New Sore throat:    Severity:  Mild   Onset quality:  Gradual   Duration:  2 days   Timing:  Constant   Progression:  Unchanged Sore Throat Pertinent negatives include no chest pain, no abdominal pain, no headaches and no shortness of breath.  Urinary Frequency Pertinent negatives include no chest pain, no abdominal pain, no headaches and no shortness of breath.    Past Medical History  Diagnosis Date  . Anal fissure   . Depression   . Fibromyalgia   . GERD (gastroesophageal reflux disease)   . HLD (hyperlipidemia)   . History of nephrolithiasis   . Sjogren's syndrome   . Calculus of gallbladder without mention of cholecystitis or obstruction   . Arthralgia of temporomandibular joint   . Sjogren - Larsson's syndrome    Past Surgical History  Procedure Laterality Date  . Cholecystectomy  08/20/08   Family History  Problem Relation Age of Onset  . Bone cancer      Grandmother  . Colon polyps Father   . Diabetes      Grandmother  . Heart disease      Grandmother   History  Substance Use Topics  . Smoking status: Never Smoker   . Smokeless tobacco: Never  Used  . Alcohol Use: Yes     Comment: Socially-rare   OB History    No data available     Review of Systems  Constitutional: Positive for fever (low grade). Negative for fatigue.  HENT: Positive for sore throat. Negative for congestion and drooling.   Eyes: Negative for pain.  Respiratory: Positive for cough (mild). Negative for shortness of breath.   Cardiovascular: Negative for chest pain.  Gastrointestinal: Negative for nausea, vomiting, abdominal pain and diarrhea.  Genitourinary: Positive for frequency. Negative for dysuria and hematuria.  Musculoskeletal: Negative for back pain, gait problem and neck pain.  Skin: Negative for color change.  Neurological: Negative for dizziness and headaches.  Hematological: Negative for adenopathy.  Psychiatric/Behavioral: Negative for behavioral problems.  All other systems reviewed and are negative.     Allergies  Albuterol; Sulfa antibiotics; and Sulfonamide derivatives  Home Medications   Prior to Admission medications   Medication Sig Start Date End Date Taking? Authorizing Provider  amoxicillin-clavulanate (AUGMENTIN) 875-125 MG per tablet Take 1 tablet by mouth 2 (two) times daily. 04/16/14   Abner Greenspan, MD  cholecalciferol (VITAMIN D) 1000 UNITS tablet Take 1,000 Units by mouth daily.      Historical Provider, MD  fluticasone (FLONASE) 50 MCG/ACT nasal spray 2 SPRAYS IN EACH NOSTRIL DAILY FOR SINUS AND ALLERGY SYMPTOMS 05/01/11  Lucille Passy, MD  levalbuterol [facility] HFA) 45 MCG/ACT inhaler Inhale 1-2 puffs into the lungs every 4 (four) hours as needed.      Historical Provider, MD  omeprazole (PRILOSEC) 20 MG capsule Take 1 capsule (20 mg total) by mouth daily. 02/08/14   Lucille Passy, MD  ranitidine (ZANTAC) 150 MG tablet Take 150 mg by mouth 2 (two) times daily as needed.      Historical Provider, MD   BP 121/82 mmHg  Pulse 115  Temp(Src) 98.9 F (37.2 C)  Resp 15  Ht 5\' 7"  (1.702 m)  Wt 145 lb (65.772 kg)  BMI 22.71  kg/m2  SpO2 100%  LMP 09/02/2014 Physical Exam  Constitutional: She is oriented to person, place, and time. She appears well-developed and well-nourished.  HENT:  Head: Normocephalic and atraumatic.  Mouth/Throat: Oropharynx is clear and moist. No oropharyngeal exudate.  Eyes: Conjunctivae and EOM are normal. Pupils are equal, round, and reactive to light.  Neck: Normal range of motion. Neck supple.  Cardiovascular: Regular rhythm, normal heart sounds and intact distal pulses.  Exam reveals no gallop and no friction rub.   No murmur heard. HR 121  Pulmonary/Chest: Effort normal and breath sounds normal. No respiratory distress. She has no wheezes.  Abdominal: Soft. Bowel sounds are normal. There is no tenderness. There is no rebound and no guarding.  Musculoskeletal: Normal range of motion. She exhibits no edema or tenderness.  No CVA tenderness.  Neurological: She is alert and oriented to person, place, and time.  alert, oriented x3 speech: normal in context and clarity memory: intact grossly cranial nerves II-XII: intact motor strength: full proximally and distally no involuntary movements or tremors sensation: intact to light touch diffusely  cerebellar: finger-to-nose and heel-to-shin intact gait: normal gait  Skin: Skin is warm and dry.  Psychiatric: She has a normal mood and affect. Her behavior is normal.  Nursing note and vitals reviewed.   ED Course  Procedures (including critical care time) Labs Review Labs Reviewed  CBC WITH DIFFERENTIAL - Abnormal; Notable for the following:    WBC 11.1 (*)    RBC 5.24 (*)    Neutrophils Relative % 80 (*)    Neutro Abs 8.9 (*)    All other components within normal limits  COMPREHENSIVE METABOLIC PANEL - Abnormal; Notable for the following:    BUN <5 (*)    Total Protein 9.1 (*)    AST 66 (*)    ALT 75 (*)    All other components within normal limits  URINALYSIS, ROUTINE W REFLEX MICROSCOPIC - Abnormal; Notable for the  following:    APPearance HAZY (*)    Hgb urine dipstick TRACE (*)    Ketones, ur >80 (*)    Leukocytes, UA MODERATE (*)    All other components within normal limits  URINE MICROSCOPIC-ADD ON - Abnormal; Notable for the following:    Squamous Epithelial / LPF MANY (*)    Bacteria, UA FEW (*)    All other components within normal limits  CULTURE, GROUP A STREP  URINE CULTURE  POC URINE PREG, ED  Randolm Idol, ED    Imaging Review Dg Chest 2 View  09/15/2014   CLINICAL DATA:  Cough.  EXAM: CHEST  2 VIEW  COMPARISON:  None.  FINDINGS: The heart size and mediastinal contours are within normal limits. Both lungs are clear. No pneumothorax or pleural effusion is noted. The visualized skeletal structures are unremarkable.  IMPRESSION: No acute cardiopulmonary abnormality  seen.   Electronically Signed   By: Sabino Dick M.D.   On: 09/15/2014 16:02     EKG Interpretation   Date/Time:  Saturday September 15 2014 15:42:20 EST Ventricular Rate:  116 PR Interval:  170 QRS Duration: 76 QT Interval:  335 QTC Calculation: 465 R Axis:   80 Text Interpretation:  Sinus tachycardia Anteroseptal infarct, old  Borderline T abnormalities, inferior leads non-spec ST-T wave changes not  as prominent on this follow up ecg Confirmed by Emma Birchler  MD, Ahmed Inniss  ([id number]) on 09/15/2014 4:08:54 PM      MDM   Final diagnoses:  Cough  URI (upper respiratory infection)  UTI (lower urinary tract infection)    3:31 PM 44 y.o. female w hx of fibromyalgia, HLD, Sjogren syn who presents with viral URI symptoms. She has had a sore throat, mild cough, and low-grade temperature with MAXIMUM TEMPERATURE of 100.3 for the last 2 days. She also reports some urinary frequency and mild bilateral lower back aching. She was seen at the urgent care prior to arrival here. She reports she had a negative strep screen, urinalysis suspicious for UTI, tachycardia, and EKG with nonspecific changes. She denies any chest pain or  shortness of breath. She does state that she does get caffeine headaches and started developing a global mild headache 6 out of 10 about 30 minutes ago. It feels similar to previous headaches and is gradual in onset. She denies any significant heart history. She does have hyperlipidemia in her past medical history but denies any high blood pressure, diabetes, significant family history of heart disease, or tobacco abuse. I reviewed the EKG earlier today which shows nonspecific ST and T-wave changes in the inferior and anterolateral precordial leads. It is unlikely that this represents any acute cardiac event in the absence of cp/sob and well appearing pt w/ URI. Do not think this is PE given no cp and lack of sob. We'll perform screening workup and hydrate the patient.  7:12 PM: The abnormality initially seen on her EKG was possibly related to the right. She continues to appear well on exam. She was found to be mildly dehydrated with ketones in her urine and was given 2 L IV fluid. Also gave her 1 g of Rocephin for UTI. Will put her on Cipro for home. Low risk for MACE per HEART score. HR has dec w/ IVF although pt remains mildly tachy. I have discussed the diagnosis/risks/treatment options with the patient and believe the pt to be eligible for discharge home to follow-up with her pcp next week. We also discussed returning to the ED immediately if new or worsening sx occur. We discussed the sx which are most concerning (e.g., sob, cp, vomiting, inability to take abx) that necessitate immediate return. Medications administered to the patient during their visit and any new prescriptions provided to the patient are listed below.  Medications given during this visit Medications  acetaminophen (TYLENOL) tablet 650 mg (not administered)  sodium chloride 0.9 % bolus 1,000 mL (0 mLs Intravenous Stopped 09/15/14 1740)  butalbital-acetaminophen-caffeine (FIORICET, ESGIC) 50-325-40 MG per tablet 1 tablet (1 tablet Oral  Given 09/15/14 1550)  sodium chloride 0.9 % bolus 1,000 mL (1,000 mLs Intravenous New Bag/Given 09/15/14 1740)  cefTRIAXone (ROCEPHIN) 1 g in dextrose 5 % 50 mL IVPB (1 g Intravenous New Bag/Given 09/15/14 1747)    New Prescriptions   CIPROFLOXACIN (CIPRO) 500 MG TABLET    Take 1 tablet (500 mg total) by mouth 2 (two) times daily.  One po bid x 7 days     Pamella Pert, MD 09/15/14 1914

## 2014-09-15 NOTE — ED Provider Notes (Signed)
Jill Leonard is a 44 y.o. female who presents to Urgent Care today for fever sore throat postnasal drip cough and back pain. Symptoms started over the past 2 days. No shortness of breath and wheezing vomiting or diarrhea. Patient has mild urinary frequency. No lightheadedness or dizziness. No chest pain or palpitations.   Past Medical History  Diagnosis Date  . Anal fissure   . Depression   . Fibromyalgia   . GERD (gastroesophageal reflux disease)   . HLD (hyperlipidemia)   . History of nephrolithiasis   . Sjogren's syndrome   . Calculus of gallbladder without mention of cholecystitis or obstruction   . Arthralgia of temporomandibular joint   . Sjogren - Larsson's syndrome    Past Surgical History  Procedure Laterality Date  . Cholecystectomy  08/20/08   History  Substance Use Topics  . Smoking status: Never Smoker   . Smokeless tobacco: Never Used  . Alcohol Use: Yes     Comment: Socially-rare   ROS as above Medications: No current facility-administered medications for this encounter.   Current Outpatient Prescriptions  Medication Sig Dispense Refill  . omeprazole (PRILOSEC) 20 MG capsule Take 1 capsule (20 mg total) by mouth daily. 30 capsule 3  . amoxicillin-clavulanate (AUGMENTIN) 875-125 MG per tablet Take 1 tablet by mouth 2 (two) times daily. 20 tablet 0  . cholecalciferol (VITAMIN D) 1000 UNITS tablet Take 1,000 Units by mouth daily.      . fluticasone (FLONASE) 50 MCG/ACT nasal spray 2 SPRAYS IN EACH NOSTRIL DAILY FOR SINUS AND ALLERGY SYMPTOMS 16 g 0  . levalbuterol (XOPENEX HFA) 45 MCG/ACT inhaler Inhale 1-2 puffs into the lungs every 4 (four) hours as needed.      . ranitidine (ZANTAC) 150 MG tablet Take 150 mg by mouth 2 (two) times daily as needed.       Allergies  Allergen Reactions  . Albuterol     REACTION: intolerant- "jittery" after use  . Sulfa Antibiotics   . Sulfonamide Derivatives     REACTION: intolerant     Exam:  BP 114/77 mmHg  Pulse  125  Temp(Src) 99 F (37.2 C) (Oral)  Resp 12  SpO2 99%  LMP 09/08/2014  Orthostatic VS for the past 24 hrs:  BP- Lying Pulse- Lying BP- Sitting Pulse- Sitting BP- Standing at 0 minutes Pulse- Standing at 0 minutes  09/15/14 1125 111/77 mmHg 126 120/89 mmHg 138 115/86 mmHg 148      Gen: Well NAD nontoxic appearing HEENT: EOMI,  MMM posterior pharynx cobblestoning. Normal tympanic membranes bilaterally. Lungs: Normal work of breathing. CTABL Heart: Tachycardia but regular no MRG Abd: NABS, Soft. Nondistended, Nontender Exts: Brisk capillary refill, warm and well perfused.   Twelve-lead EKG sinus tachycardia at 1 23 bpm. downsloping ST segment leads II, III, aVF. No Q waves. Inverted lateral precordial T waves. QTC 409 Concerning for inferior infarct  Results for orders placed or performed during the hospital encounter of 09/15/14 (from the past 24 hour(s))  POCT rapid strep A [facility])     Status: None   Collection Time: 09/15/14 11:14 AM  Result Value Ref Range   Streptococcus, Group A Screen (Direct) NEGATIVE NEGATIVE  POCT urinalysis dip (device)     Status: Abnormal   Collection Time: 09/15/14 11:24 AM  Result Value Ref Range   Glucose, UA NEGATIVE NEGATIVE mg/dL   Bilirubin Urine NEGATIVE NEGATIVE   Ketones, ur NEGATIVE NEGATIVE mg/dL   Specific Gravity, Urine 1.010 1.005 - 1.030  Hgb urine dipstick TRACE (A) NEGATIVE   pH 6.0 5.0 - 8.0   Protein, ur NEGATIVE NEGATIVE mg/dL   Urobilinogen, UA 0.2 0.0 - 1.0 mg/dL   Nitrite NEGATIVE NEGATIVE   Leukocytes, UA TRACE (A) NEGATIVE   No results found.  Assessment and Plan: 44 y.o. female with sinus tachycardia with concerning for inferior infarct. Patient is tachycardiac but otherwise asymptomatic. She has no significant heart history. No prior EKGs to compare. Transfer to ED via shuttle for further evaluation and management.  Discussed warning signs or symptoms. Please see discharge instructions. Patient expresses  understanding.     Gregor Hams, MD 09/15/14 [phone]

## 2014-09-15 NOTE — ED Notes (Signed)
Pt able to eat food and drink per MD. Pt tolerating fluids and sandwich.

## 2014-09-17 LAB — URINE CULTURE: Colony Count: >=100000

## 2014-09-17 LAB — CULTURE, GROUP A STREP

## 2014-09-19 ENCOUNTER — Encounter: Payer: Self-pay | Admitting: Family Medicine

## 2014-09-19 ENCOUNTER — Ambulatory Visit (INDEPENDENT_AMBULATORY_CARE_PROVIDER_SITE_OTHER): Payer: BLUE CROSS/BLUE SHIELD | Admitting: Family Medicine

## 2014-09-19 VITALS — BP 116/68 | HR 105 | Temp 98.6°F | Wt 146.5 lb

## 2014-09-19 DIAGNOSIS — R000 Tachycardia, unspecified: Secondary | ICD-10-CM | POA: Insufficient documentation

## 2014-09-19 DIAGNOSIS — N390 Urinary tract infection, site not specified: Secondary | ICD-10-CM

## 2014-09-19 DIAGNOSIS — J069 Acute upper respiratory infection, unspecified: Secondary | ICD-10-CM

## 2014-09-19 MED ORDER — AMOXICILLIN-POT CLAVULANATE 875-125 MG PO TABS: 1.0000 | ORAL_TABLET | Freq: Two times a day (BID) | ORAL | Status: DC

## 2014-09-19 NOTE — Assessment & Plan Note (Signed)
Persistent but improving. Advised to d/c mucinex dm- ok to take mucinex (plain). Hopefully combination of rx (pseudophedrine, etc) and dehydration. Continue to push fluids. She will monitor HR at home. Call or return to clinic prn if these symptoms worsen or fail to improve as anticipated. The patient indicates understanding of these issues and agrees with the plan.

## 2014-09-19 NOTE — Progress Notes (Signed)
Pre visit review using our clinic review tool, if applicable. No additional management support is needed unless otherwise documented below in the visit note. 

## 2014-09-19 NOTE — Assessment & Plan Note (Signed)
Given duration and progression of symptoms, will treat for bacterial sinusitis with Augmentin. Urine cx results still pending- advised to finish course of cipro as well. She will let me know if she develops vaginitis.  Advised probiotics as well. Call or return to clinic prn if these symptoms worsen or fail to improve as anticipated. The patient indicates understanding of these issues and agrees with the plan.

## 2014-09-19 NOTE — Assessment & Plan Note (Signed)
Finish course of cipro. Call or return to clinic prn if these symptoms worsen or fail to improve as anticipated. The patient indicates understanding of these issues and agrees with the plan.

## 2014-09-19 NOTE — Patient Instructions (Signed)
Great to see you. Hang in there. Drinks a lot of fluids.  Keep monitoring your heart rate-stop taking Mucinex DM, try regular mucinex. Call me with your heart rate and your symptoms tomorrow.  Take Augmentin as directed- 1 tablet twice daily for 10 days.

## 2014-09-19 NOTE — Progress Notes (Signed)
Subjective:   Patient ID: Jill Leonard, female    DOB: 11-26-1970, 44 y.o.   MRN: [id number]  Jill Leonard is a pleasant 44 y.o. year old female with h/o Sjogren's syndrome, who presents to clinic today with Hospitalization Follow-up; congestion in chest; and Cough  on 09/19/2014  HPI:  Notes reviewed- went to [facility] on 1/2 with URI symptoms- fever, sore throat, PND of 2 days duration. Also complained of urinary frequency with intermittent back pain. Tachycardic at [facility] with abnormal EKG was sent to ER.  In ER, WBC elevated at 11.1, AST and ALT elevated 66/75, UA pos for trace blood, > 80 ketones and moderate LE with many bacteria on micro- cx pending. CXR neg. Rapid strep neg. Felt her EKG was not consistent with cardiac issue. Given 2 L IVFs for dehydration given positive ketones and tachycardia on exam. Given IM rocephin and sent home with 7 day course of cipro 500 mg twice daily for UTI treatment.  She is here today because she spiked a temperature last night and now she has more sinus pressure and tooth pain.  Urinary frequency and back pain have resolved.  Afebrile this am but has been taking OTC cough/cold medicine along with Mucinex DM.  Current Outpatient Prescriptions on File Prior to Visit  Medication Sig Dispense Refill  . acetaminophen (TYLENOL) 500 MG tablet Take 1,000 mg by mouth every 8 (eight) hours as needed (pain).    Marland Kitchen aspirin-acetaminophen-caffeine (EXCEDRIN MIGRAINE) 250-250-65 MG per tablet Take 2 tablets by mouth every 8 (eight) hours as needed for headache.    . cetirizine-pseudoephedrine (ZYRTEC-D) 5-120 MG per tablet Take 1 tablet by mouth 2 (two) times daily.    . cholecalciferol (VITAMIN D) 1000 UNITS tablet Take 1,000 Units by mouth daily.      . ciprofloxacin (CIPRO) 500 MG tablet Take 1 tablet (500 mg total) by mouth 2 (two) times daily. One po bid x 7 days 14 tablet 0  . fluticasone (FLONASE) 50 MCG/ACT nasal spray 2 SPRAYS IN EACH NOSTRIL DAILY FOR  SINUS AND ALLERGY SYMPTOMS 16 g 0  . levalbuterol (XOPENEX HFA) 45 MCG/ACT inhaler Inhale 1-2 puffs into the lungs every 4 (four) hours as needed for wheezing or shortness of breath.     Marland Kitchen omeprazole (PRILOSEC) 20 MG capsule Take 1 capsule (20 mg total) by mouth daily. 30 capsule 3  . ranitidine (ZANTAC) 150 MG tablet Take 150 mg by mouth 2 (two) times daily as needed for heartburn.      No current facility-administered medications on file prior to visit.    Allergies  Allergen Reactions  . Albuterol     REACTION: intolerant- "jittery" after use  . Sulfa Antibiotics   . Sulfonamide Derivatives     REACTION: intolerant    Past Medical History  Diagnosis Date  . Anal fissure   . Depression   . Fibromyalgia   . GERD (gastroesophageal reflux disease)   . HLD (hyperlipidemia)   . History of nephrolithiasis   . Sjogren's syndrome   . Calculus of gallbladder without mention of cholecystitis or obstruction   . Arthralgia of temporomandibular joint   . Sjogren - Larsson's syndrome     Past Surgical History  Procedure Laterality Date  . Cholecystectomy  08/20/08    Family History  Problem Relation Age of Onset  . Bone cancer      Grandmother  . Colon polyps Father   . Diabetes      Grandmother  .  Heart disease      Grandmother    History   Social History  . Marital Status: Married    Spouse Name: N/A    Number of Children: N/A  . Years of Education: N/A   Occupational History  . Not on file.   Social History Main Topics  . Smoking status: Never Smoker   . Smokeless tobacco: Never Used  . Alcohol Use: Yes     Comment: Socially-rare  . Drug Use: No  . Sexual Activity: Not on file   Other Topics Concern  . Not on file   Social History Narrative   Married      Caffeine: 2 cups/day      No regular exercise         The PMH, PSH, Social History, Family History, Medications, and allergies have been reviewed in [facility], and have been updated if  relevant.         Review of Systems  Constitutional: Positive for fever, chills and fatigue.  HENT: Positive for congestion, postnasal drip, rhinorrhea, sinus pressure, sneezing and sore throat. Negative for trouble swallowing and voice change.   Respiratory: Positive for cough. Negative for shortness of breath, wheezing and stridor.   Cardiovascular: Positive for palpitations. Negative for chest pain and leg swelling.  Gastrointestinal: Negative.   Genitourinary: Negative.   Musculoskeletal: Negative.   Skin: Negative.   Neurological: Negative.   Hematological: Negative.   Psychiatric/Behavioral: Negative.   All other systems reviewed and are negative.      Objective:    BP 116/68 mmHg  Pulse 105  Temp(Src) 98.6 F (37 C) (Oral)  Wt 146 lb 8 oz (66.452 kg)  SpO2 98%  LMP 09/02/2014   Physical Exam  Constitutional: She is oriented to person, place, and time. She appears well-developed and well-nourished. No distress.  HENT:  Head: Normocephalic and atraumatic.  Right Ear: Tympanic membrane normal.  Left Ear: Tympanic membrane normal.  Nose: Mucosal edema and rhinorrhea present. No sinus tenderness or nasal deformity. Right sinus exhibits maxillary sinus tenderness and frontal sinus tenderness. Left sinus exhibits maxillary sinus tenderness and frontal sinus tenderness.  Mouth/Throat: Uvula is midline and mucous membranes are normal. Posterior oropharyngeal erythema present. No oropharyngeal exudate, posterior oropharyngeal edema or tonsillar abscesses.  Cardiovascular:  No murmur heard. +tachycardia- improved from UC and ED visits  Pulmonary/Chest: Effort normal.  Abdominal: Soft.  Musculoskeletal: Normal range of motion.  Neurological: She is oriented to person, place, and time. No cranial nerve deficit.  Skin: Skin is warm and dry.  Psychiatric: She has a normal mood and affect. Her behavior is normal. Judgment and thought content normal.  Nursing note and  vitals reviewed.         Assessment & Plan:   Acute upper respiratory infection  Urinary tract infection without hematuria, site unspecified No Follow-up on file.

## 2014-09-25 ENCOUNTER — Telehealth: Payer: Self-pay | Admitting: Family Medicine

## 2014-09-25 DIAGNOSIS — R000 Tachycardia, unspecified: Secondary | ICD-10-CM

## 2014-09-25 NOTE — Addendum Note (Signed)
Addended by: Lucille Passy on: 09/25/2014 10:05 AM   Modules accepted: Orders

## 2014-09-25 NOTE — Telephone Encounter (Signed)
I will refer her now to cardiology- she does not need to be seen first.  I am sorry she is not feeling better.

## 2014-09-25 NOTE — Telephone Encounter (Signed)
Spoke to pt and advised per Dr Deborra Medina; advised to await a call with appt details

## 2014-09-25 NOTE — Telephone Encounter (Signed)
Patient was seen last week and told to call if the Tachycardia had not improved.  Patient said her heart feels fluttery and she feels jittery.  It's not constant.  Patient wants to know if she needs to come back and see Dr.Aron or be referred to Cardiology?

## 2014-10-01 ENCOUNTER — Ambulatory Visit (INDEPENDENT_AMBULATORY_CARE_PROVIDER_SITE_OTHER): Payer: BLUE CROSS/BLUE SHIELD | Admitting: Cardiovascular Disease

## 2014-10-01 ENCOUNTER — Encounter: Payer: Self-pay | Admitting: Cardiovascular Disease

## 2014-10-01 VITALS — BP 100/74 | HR 116 | Ht 67.0 in | Wt 147.8 lb

## 2014-10-01 DIAGNOSIS — R0602 Shortness of breath: Secondary | ICD-10-CM

## 2014-10-01 DIAGNOSIS — R002 Palpitations: Secondary | ICD-10-CM

## 2014-10-01 DIAGNOSIS — I471 Supraventricular tachycardia: Secondary | ICD-10-CM

## 2014-10-01 DIAGNOSIS — R000 Tachycardia, unspecified: Secondary | ICD-10-CM

## 2014-10-01 NOTE — Progress Notes (Signed)
Primary care physician: Dr. Deborra Medina  HPI  This is a 44 year old female who was referred for evaluation of palpitations and tachycardia. She has known history of Sjogren's syndrome. She reports prolonged symptoms of a viral illness or upper respiratory tract infection over the last few weeks. She went to [facility] on 1/2 with URI symptoms- fever, sore throat, PND of 2 days duration. She was noted to be tachycardic and was sent to the emergency room. In ER, WBC elevated at 11.1, AST and ALT elevated 66/75, UA pos for trace blood, > 80 ketones and moderate LE with many bacteria on micro- cx pending. CXR neg. Rapid strep neg. EKG showed sinus tachycardia. Given 2 L IVFs for dehydration given positive ketones and tachycardia on exam. Given IM rocephin and sent home with 7 day course of cipro 500 mg twice daily for UTI treatment. She reports improvement in symptoms overall. However, she continues to complain of palpitations and tachycardia. She also noticed worsening shortness of breath and orthopnea. No lower extremity edema. No chest discomfort except when she coughs.  Allergies  Allergen Reactions  . Albuterol     REACTION: intolerant- "jittery" after use  . Sulfa Antibiotics   . Sulfonamide Derivatives     REACTION: intolerant     Current Outpatient Prescriptions on File Prior to Visit  Medication Sig Dispense Refill  . acetaminophen (TYLENOL) 500 MG tablet Take 1,000 mg by mouth every 8 (eight) hours as needed (pain).    Marland Kitchen aspirin-acetaminophen-caffeine (EXCEDRIN MIGRAINE) 250-250-65 MG per tablet Take 2 tablets by mouth every 8 (eight) hours as needed for headache.    . cetirizine-pseudoephedrine (ZYRTEC-D) 5-120 MG per tablet Take 1 tablet by mouth as needed.     . cholecalciferol (VITAMIN D) 1000 UNITS tablet Take 1,000 Units by mouth daily.      . fluticasone (FLONASE) 50 MCG/ACT nasal spray 2 SPRAYS IN EACH NOSTRIL DAILY FOR SINUS AND ALLERGY SYMPTOMS (Patient taking differently: 2 SPRAYS  IN EACH NOSTRIL DAILY FOR SINUS AND ALLERGY SYMPTOMS as needed) 16 g 0  . levalbuterol (XOPENEX HFA) 45 MCG/ACT inhaler Inhale 1-2 puffs into the lungs every 4 (four) hours as needed for wheezing or shortness of breath.     Marland Kitchen omeprazole (PRILOSEC) 20 MG capsule Take 1 capsule (20 mg total) by mouth daily. 30 capsule 3  . ranitidine (ZANTAC) 150 MG tablet Take 150 mg by mouth 2 (two) times daily as needed for heartburn.      No current facility-administered medications on file prior to visit.     Past Medical History  Diagnosis Date  . Anal fissure   . Depression   . Fibromyalgia   . GERD (gastroesophageal reflux disease)   . HLD (hyperlipidemia)   . History of nephrolithiasis   . Sjogren's syndrome   . Calculus of gallbladder without mention of cholecystitis or obstruction   . Arthralgia of temporomandibular joint   . Sjogren - Larsson's syndrome   . Kidney stones   . Heart murmur      Past Surgical History  Procedure Laterality Date  . Cholecystectomy  08/20/08  . Wisdom tooth extraction       Family History  Problem Relation Age of Onset  . Bone cancer      Grandmother  . Colon polyps Father   . Diabetes      Grandmother  . Heart disease      Grandmother     History   Social History  . Marital Status: Married  Spouse Name: N/A    Number of Children: N/A  . Years of Education: N/A   Occupational History  . Not on file.   Social History Main Topics  . Smoking status: Never Smoker   . Smokeless tobacco: Never Used  . Alcohol Use: Yes     Comment: Socially-rare  . Drug Use: No  . Sexual Activity: Not on file   Other Topics Concern  . Not on file   Social History Narrative   Married      Caffeine: 2 cups/day      No regular exercise           ROS A 10 point review of system was performed. It is negative other than that mentioned in the history of present illness.   PHYSICAL EXAM   BP 100/74 mmHg  Pulse 116  Ht 5\' 7"  (1.702 m)  Wt  147 lb 12 oz (67.019 kg)  BMI 23.14 kg/m2  LMP 09/02/2014 Constitutional: She is oriented to person, place, and time. She appears well-developed and well-nourished. No distress.  HENT: No nasal discharge.  Head: Normocephalic and atraumatic.  Eyes: Pupils are equal and round. No discharge.  Neck: Normal range of motion. Neck supple. No JVD present. No thyromegaly present.  Cardiovascular: Tachycardic, regular rhythm, normal heart sounds. Exam reveals no gallop and no friction rub. No murmur heard.  Pulmonary/Chest: Effort normal and breath sounds normal. No stridor. No respiratory distress. She has no wheezes. She has no rales. She exhibits no tenderness.  Abdominal: Soft. Bowel sounds are normal. She exhibits no distension. There is no tenderness. There is no rebound and no guarding.  Musculoskeletal: Normal range of motion. She exhibits no edema and no tenderness.  Neurological: She is alert and oriented to person, place, and time. Coordination normal.  Skin: Skin is warm and dry. No rash noted. She is not diaphoretic. No erythema. No pallor.  Psychiatric: She has a normal mood and affect. Her behavior is normal. Judgment and thought content normal.     IHW:TUUEK  Tachycardia  -Old anteroseptal infarct.   -  Nonspecific T-abnormality.   ABNORMAL     ASSESSMENT AND PLAN

## 2014-10-01 NOTE — Patient Instructions (Signed)
Your physician has requested that you have an echocardiogram. Echocardiography is a painless test that uses sound waves to create images of your heart. It provides your doctor with information about the size and shape of your heart and how well your heart's chambers and valves are working. This procedure takes approximately one hour. There are no restrictions for this procedure.   Your physician has recommended that you wear a holter monitor. Holter monitors are medical devices that record the heart's electrical activity. Doctors most often use these monitors to diagnose arrhythmias. Arrhythmias are problems with the speed or rhythm of the heartbeat. The monitor is a small, portable device. You can wear one while you do your normal daily activities. This is usually used to diagnose what is causing palpitations/syncope (passing out).  Your physician recommends that you schedule a follow-up appointment in:  After your echo and holter

## 2014-10-02 ENCOUNTER — Ambulatory Visit: Payer: BLUE CROSS/BLUE SHIELD | Admitting: Cardiovascular Disease

## 2014-10-02 ENCOUNTER — Encounter: Payer: Self-pay | Admitting: Internal Medicine

## 2014-10-02 ENCOUNTER — Ambulatory Visit (INDEPENDENT_AMBULATORY_CARE_PROVIDER_SITE_OTHER): Payer: BLUE CROSS/BLUE SHIELD | Admitting: Internal Medicine

## 2014-10-02 VITALS — BP 108/76 | HR 109 | Temp 98.5°F | Wt 147.0 lb

## 2014-10-02 DIAGNOSIS — R0602 Shortness of breath: Secondary | ICD-10-CM

## 2014-10-02 DIAGNOSIS — M545 Low back pain, unspecified: Secondary | ICD-10-CM

## 2014-10-02 DIAGNOSIS — R000 Tachycardia, unspecified: Secondary | ICD-10-CM

## 2014-10-02 DIAGNOSIS — R05 Cough: Secondary | ICD-10-CM

## 2014-10-02 DIAGNOSIS — R102 Pelvic and perineal pain: Secondary | ICD-10-CM

## 2014-10-02 DIAGNOSIS — R059 Cough, unspecified: Secondary | ICD-10-CM

## 2014-10-02 NOTE — Patient Instructions (Signed)
Nonspecific Tachycardia  Tachycardia is a faster than normal heartbeat (more than 100 beats per minute). In adults, the heart normally beats between 60 and 100 times a minute. A fast heartbeat may be a normal response to exercise or stress. It does not necessarily mean that something is wrong. However, sometimes when your heart beats too fast it may not be able to pump enough blood to the rest of your body. This can result in chest pain, shortness of breath, dizziness, and even fainting. Nonspecific tachycardia means that the specific cause or pattern of your tachycardia is unknown.  CAUSES   Tachycardia may be harmless or it may be due to a more serious underlying cause. Possible causes of tachycardia include:  · Exercise or exertion.  · Fever.  · Pain or injury.  · Infection.  · Loss of body fluids (dehydration).  · Overactive thyroid.  · Lack of red blood cells (anemia).  · Anxiety and stress.  · Alcohol.  · Caffeine.  · Tobacco products.  · Diet pills.  · Illegal drugs.  · Heart disease.  SYMPTOMS  · Rapid or irregular heartbeat (palpitations).  · Suddenly feeling your heart beating (cardiac awareness).  · Dizziness.  · Tiredness (fatigue).  · Shortness of breath.  · Chest pain.  · Nausea.  · Fainting.  DIAGNOSIS   Your caregiver will perform a physical exam and take your medical history. In some cases, a heart specialist (cardiologist) may be consulted. Your caregiver may also order:  · Blood tests.  · Electrocardiography. This test records the electrical activity of your heart.  · A heart monitoring test.  TREATMENT   Treatment will depend on the likely cause of your tachycardia. The goal is to treat the underlying cause of your tachycardia. Treatment methods may include:  · Replacement of fluids or blood through an intravenous (IV) tube for moderate to severe dehydration or anemia.  · New medicines or changes in your current medicines.  · Diet and lifestyle changes.  · Treatment for certain  infections.  · Stress relief or relaxation methods.  HOME CARE INSTRUCTIONS   · Rest.  · Drink enough fluids to keep your urine clear or pale yellow.  · Do not smoke.  · Avoid:  ¨ Caffeine.  ¨ Tobacco.  ¨ Alcohol.  ¨ Chocolate.  ¨ Stimulants such as over-the-counter diet pills or pills that help you stay awake.  ¨ Situations that cause anxiety or stress.  ¨ Illegal drugs such as marijuana, phencyclidine (PCP), and cocaine.  · Only take medicine as directed by your caregiver.  · Keep all follow-up appointments as directed by your caregiver.  SEEK IMMEDIATE MEDICAL CARE IF:   · You have pain in your chest, upper arms, jaw, or neck.  · You become weak, dizzy, or feel faint.  · You have palpitations that will not go away.  · You vomit, have diarrhea, or pass blood in your stool.  · Your skin is cool, pale, and wet.  · You have a fever that will not go away with rest, fluids, and medicine.  MAKE SURE YOU:   · Understand these instructions.  · Will watch your condition.  · Will get help right away if you are not doing well or get worse.  Document Released: 10/08/2004 Document Revised: 11/23/2011 Document Reviewed: 08/11/2011  Exit[employer]® Patient Information ©2015 [employer]. This information is not intended to replace advice given to you by your health care provider. Make sure you discuss any questions   you have with your health care provider.

## 2014-10-02 NOTE — Progress Notes (Signed)
Pre visit review using our clinic review tool, if applicable. No additional management support is needed unless otherwise documented below in the visit note. 

## 2014-10-02 NOTE — Progress Notes (Signed)
HPI:  Jill Leonard is a 44 y.o. female presenting to the clinic today for follow-up of dx UTI and URI 2 weeks ago in which she was treated, as well as b/l back pain and lower abdominal pain x 2 days. UTI symptoms have mostly cleared up w/ tx of Cipro, although she has been feeling similar pressure and pain in the lower abdominal area as when she had a kidney stone. No burning, dysuria, or hematuria. Also reports feeling similar lower abdominal pain as when dx w/ ovarian cyst. She's still recovering from URI w/ mild SOB accompanying cough, despite tx w/ Augmentin, but mucous secretions and congestion have markedly improved. Pt. reports two weeks of tachycardia, in which she is being followed by cardiologist. Scheduled for holter monitor next week. GERD: started taking Prilosec 3 days ago again for heart burn in which symptoms have improved.    Review of Systems      Past Medical History  Diagnosis Date  . Anal fissure   . Depression   . Fibromyalgia   . GERD (gastroesophageal reflux disease)   . HLD (hyperlipidemia)   . History of nephrolithiasis   . Sjogren's syndrome   . Calculus of gallbladder without mention of cholecystitis or obstruction   . Arthralgia of temporomandibular joint   . Sjogren - Larsson's syndrome   . Kidney stones   . Heart murmur     Family History  Problem Relation Age of Onset  . Bone cancer      Grandmother  . Colon polyps Father   . Diabetes      Grandmother  . Heart disease      Grandmother    History   Social History  . Marital Status: Married    Spouse Name: N/A    Number of Children: N/A  . Years of Education: N/A   Occupational History  . Not on file.   Social History Main Topics  . Smoking status: Never Smoker   . Smokeless tobacco: Never Used  . Alcohol Use: Yes     Comment: Socially-rare  . Drug Use: No  . Sexual Activity: Not on file   Other Topics Concern  . Not on file   Social History Narrative   Married      Caffeine:  2 cups/day      No regular exercise          Allergies  Allergen Reactions  . Albuterol     REACTION: intolerant- "jittery" after use  . Sulfa Antibiotics   . Sulfonamide Derivatives     REACTION: intolerant   Constitutional: Intermittent nausea resolved w/ eating. Denies vomiting, headache, fatigue, fever, or abrupt weight changes.  HEENT:  Denies sore throat, eye redness, eye pain, pressure behind the eyes, facial pain, nasal congestion, ear pain, ringing in the ears, wax buildup, runny nose or bloody nose. Respiratory: Positive cough and slight SOB while coughing. Cardiovascular: Positive tachycardia. Denies chest pain, chest tightness, palpitations or swelling in the hands or feet.   No other specific complaints in a complete review of systems (except as listed in HPI above).  Objective:   BP 108/76 mmHg  Pulse 109  Temp(Src) 98.5 F (36.9 C) (Oral)  Wt 147 lb (66.679 kg)  SpO2 98%  LMP 09/02/2014 Wt Readings from Last 3 Encounters:  10/02/14 147 lb (66.679 kg)  10/01/14 147 lb 12 oz (67.019 kg)  09/19/14 146 lb 8 oz (66.452 kg)    General: She is oriented to person, place, and  time. Well-developed and well-nourished. Seems anxious and tearful at times. HEENT: Head: normal shape and size; Eyes: sclera white, no icterus, conjunctiva pink; Ears: Tm's gray and intact, normal light reflex; Nose: mucosa pink and moist, septum midline; Throat/Mouth: + PND. Teeth present, mucosa erythematous and moist, no exudate noted, no lesions or ulcerations noted.  Neck: Mild cervical lymphadenopathy.   Cardiovascular: Tachycardic. S1,S2 noted.  No murmur, rubs or gallops noted. No JVD or BLE edema. No carotid bruits noted. Pulmonary/Chest: Normal effort and positive vesicular breath sounds. No respiratory distress. No wheezes, rales or ronchi noted.  Abdomen: Normal bowel sounds, soft nt/nd. Tenderness to light and deep palpation of RLQ, LLQ, and periumbilical. No CVA tenderness.    Assessment & Plan:   Back pain:  Negative urinalysis; could be musculoskeletal - related to cough. Try Ibuprofen, heating pad or ice pack, plenty of rest.  Lower abdominal pain:  Negative urinalysis; could be ovarian cyst related Ibuprofen, heating pad If does not improve, will consider pelvic/transvaginal ultrasound  Cough & SOB:  Improvement of URI, so probably related to her tachycardia. She is scheduled to have a holter monitor next week No indication for repeat antibiotics at this time Continue to follow with cardiology  RTC as needed or if symptoms persist.

## 2014-10-03 LAB — POCT URINALYSIS DIPSTICK
Bilirubin, UA: NEGATIVE
Blood, UA: NEGATIVE
GLUCOSE UA: NEGATIVE
LEUKOCYTES UA: NEGATIVE
NITRITE UA: NEGATIVE
Protein, UA: NEGATIVE
Spec Grav, UA: 1.015
UROBILINOGEN UA: NEGATIVE
pH, UA: 6.5

## 2014-10-03 NOTE — Addendum Note (Signed)
Addended by: Lurlean Nanny on: 10/03/2014 08:09 AM   Modules accepted: Orders

## 2014-10-05 ENCOUNTER — Encounter: Payer: Self-pay | Admitting: Cardiovascular Disease

## 2014-10-05 NOTE — Assessment & Plan Note (Signed)
The patient continues to have significant palpitations and sinus tachycardia in the setting of what seems to be a recent viral illness or upper respiratory tract infection. Her EKG is abnormal with possible old anteroseptal infarct. I think it's important to exclude the possibility of myocarditis related to her viral illness. Thus, I requested an echocardiogram for evaluation. I also requested a 48-hour Holter monitor to ensure no other associated arrhythmia. She is to follow-up after cardiac workup.

## 2014-10-09 ENCOUNTER — Other Ambulatory Visit (INDEPENDENT_AMBULATORY_CARE_PROVIDER_SITE_OTHER): Payer: BLUE CROSS/BLUE SHIELD

## 2014-10-09 ENCOUNTER — Other Ambulatory Visit: Payer: Self-pay

## 2014-10-09 DIAGNOSIS — I471 Supraventricular tachycardia: Secondary | ICD-10-CM

## 2014-10-09 DIAGNOSIS — R002 Palpitations: Secondary | ICD-10-CM

## 2014-10-09 DIAGNOSIS — R0602 Shortness of breath: Secondary | ICD-10-CM

## 2014-10-15 DIAGNOSIS — R002 Palpitations: Secondary | ICD-10-CM

## 2014-10-22 ENCOUNTER — Ambulatory Visit (INDEPENDENT_AMBULATORY_CARE_PROVIDER_SITE_OTHER): Payer: BLUE CROSS/BLUE SHIELD | Admitting: Cardiovascular Disease

## 2014-10-22 ENCOUNTER — Encounter: Payer: Self-pay | Admitting: Cardiovascular Disease

## 2014-10-22 VITALS — BP 108/80 | HR 111 | Ht 67.0 in | Wt 148.5 lb

## 2014-10-22 DIAGNOSIS — R000 Tachycardia, unspecified: Secondary | ICD-10-CM

## 2014-10-22 MED ORDER — METOPROLOL SUCCINATE ER 25 MG PO TB24: 25.0000 mg | ORAL_TABLET | Freq: Every day | ORAL | Status: DC

## 2014-10-22 NOTE — Progress Notes (Signed)
Primary care physician: Dr. Deborra Medina  HPI  This is a 44 year old female who is here today for a follow-up visit regarding palpitations and tachycardia. She has known history of Sjogren's syndrome. She reports prolonged symptoms of a viral illness or upper respiratory tract infection over the last few weeks. She went to [facility] on 1/2 with URI symptoms- fever, sore throat, PND of 2 days duration. She was noted to be tachycardic and was sent to the emergency room. In ER, WBC elevated at 11.1, AST and ALT elevated 66/75, UA pos for trace blood, > 80 ketones and moderate LE with many bacteria on micro- cx pending. CXR neg. Rapid strep neg. EKG showed sinus tachycardia. Given 2 L IVFs for dehydration given positive ketones and tachycardia on exam. Given IM rocephin and sent home with 7 day course of cipro 500 mg twice daily for UTI treatment. An echocardiogram was done which showed normal LV systolic function with an ejection fraction of 60% with borderline prolapse of anterior mitral valve leaflet with only mild mitral regurgitation and no evidence of pulmonary hypertension. A Holter monitor showed normal sinus rhythm with occasional PVCs and PACs. She had a total of 445 PVCs and 1200 PACs.  Allergies  Allergen Reactions  . Albuterol     REACTION: intolerant- "jittery" after use  . Sulfa Antibiotics   . Sulfonamide Derivatives     REACTION: intolerant     Current Outpatient Prescriptions on File Prior to Visit  Medication Sig Dispense Refill  . acetaminophen (TYLENOL) 500 MG tablet Take 1,000 mg by mouth every 8 (eight) hours as needed (pain).    Marland Kitchen aspirin-acetaminophen-caffeine (EXCEDRIN MIGRAINE) 250-250-65 MG per tablet Take 2 tablets by mouth every 8 (eight) hours as needed for headache.    . cetirizine-pseudoephedrine (ZYRTEC-D) 5-120 MG per tablet Take 1 tablet by mouth as needed.     . cholecalciferol (VITAMIN D) 1000 UNITS tablet Take 1,000 Units by mouth daily.      . fluticasone  (FLONASE) 50 MCG/ACT nasal spray 2 SPRAYS IN EACH NOSTRIL DAILY FOR SINUS AND ALLERGY SYMPTOMS (Patient taking differently: 2 SPRAYS IN EACH NOSTRIL DAILY FOR SINUS AND ALLERGY SYMPTOMS as needed) 16 g 0  . levalbuterol (XOPENEX HFA) 45 MCG/ACT inhaler Inhale 1-2 puffs into the lungs every 4 (four) hours as needed for wheezing or shortness of breath.     Marland Kitchen omeprazole (PRILOSEC) 20 MG capsule Take 1 capsule (20 mg total) by mouth daily. 30 capsule 3  . ranitidine (ZANTAC) 150 MG tablet Take 150 mg by mouth 2 (two) times daily as needed for heartburn.      No current facility-administered medications on file prior to visit.     Past Medical History  Diagnosis Date  . Anal fissure   . Depression   . Fibromyalgia   . GERD (gastroesophageal reflux disease)   . HLD (hyperlipidemia)   . History of nephrolithiasis   . Sjogren's syndrome   . Calculus of gallbladder without mention of cholecystitis or obstruction   . Arthralgia of temporomandibular joint   . Sjogren - Larsson's syndrome   . Kidney stones   . Heart murmur      Past Surgical History  Procedure Laterality Date  . Cholecystectomy  08/20/08  . Wisdom tooth extraction       Family History  Problem Relation Age of Onset  . Bone cancer      Grandmother  . Colon polyps Father   . Diabetes      Grandmother  .  Heart disease      Grandmother     History   Social History  . Marital Status: Married    Spouse Name: N/A  . Number of Children: N/A  . Years of Education: N/A   Occupational History  . Not on file.   Social History Main Topics  . Smoking status: Never Smoker   . Smokeless tobacco: Never Used  . Alcohol Use: Yes     Comment: Socially-rare  . Drug Use: No  . Sexual Activity: Not on file   Other Topics Concern  . Not on file   Social History Narrative   Married      Caffeine: 2 cups/day      No regular exercise           ROS A 10 point review of system was performed. It is negative other  than that mentioned in the history of present illness.   PHYSICAL EXAM   BP 108/80 mmHg  Pulse 111  Ht 5\' 7"  (1.702 m)  Wt 148 lb 8 oz (67.359 kg)  BMI 23.25 kg/m2 Constitutional: She is oriented to person, place, and time. She appears well-developed and well-nourished. No distress.  HENT: No nasal discharge.  Head: Normocephalic and atraumatic.  Eyes: Pupils are equal and round. No discharge.  Neck: Normal range of motion. Neck supple. No JVD present. No thyromegaly present.  Cardiovascular: Tachycardic, regular rhythm, normal heart sounds. Exam reveals no gallop and no friction rub. No murmur heard.  Pulmonary/Chest: Effort normal and breath sounds normal. No stridor. No respiratory distress. She has no wheezes. She has no rales. She exhibits no tenderness.  Abdominal: Soft. Bowel sounds are normal. She exhibits no distension. There is no tenderness. There is no rebound and no guarding.  Musculoskeletal: Normal range of motion. She exhibits no edema and no tenderness.  Neurological: She is alert and oriented to person, place, and time. Coordination normal.  Skin: Skin is warm and dry. No rash noted. She is not diaphoretic. No erythema. No pallor.  Psychiatric: She has a normal mood and affect. Her behavior is normal. Judgment and thought content normal.     [id number]  Tachycardia  Low voltage in precordial leads.   -  Nonspecific T-abnormality.   ABNORMAL     ASSESSMENT AND PLAN

## 2014-10-22 NOTE — Patient Instructions (Signed)
Start Metoprolol ER (Toprol) 25 mg once daily.   Follow up in 3 months.

## 2014-10-25 NOTE — Assessment & Plan Note (Signed)
The patient continues to have sinus tachycardia and symptoms of palpitations. Echocardiogram showed overall no significant structural abnormalities. She does have borderline mitral valve prolapse this does not seem to be causing issues at the present time. She has no cardiac murmurs and echo showed only mild regurgitation. Holter monitor showed PACs and PVCs. Given that she is still symptomatic, I elected to start her on small dose Toprol 25 mg once daily.

## 2014-10-26 ENCOUNTER — Encounter (INDEPENDENT_AMBULATORY_CARE_PROVIDER_SITE_OTHER): Payer: BLUE CROSS/BLUE SHIELD

## 2014-10-26 ENCOUNTER — Other Ambulatory Visit: Payer: Self-pay

## 2014-10-26 DIAGNOSIS — R002 Palpitations: Secondary | ICD-10-CM

## 2014-10-27 ENCOUNTER — Encounter (HOSPITAL_COMMUNITY): Payer: Self-pay

## 2014-10-27 ENCOUNTER — Emergency Department (HOSPITAL_COMMUNITY)
Admission: EM | Admit: 2014-10-27 | Discharge: 2014-10-27 | Disposition: A | Discharge status: Home / Self Care | Payer: BLUE CROSS/BLUE SHIELD | Source: Home / Self Care | Attending: Family Medicine | Admitting: Family Medicine

## 2014-10-27 DIAGNOSIS — J069 Acute upper respiratory infection, unspecified: Secondary | ICD-10-CM

## 2014-10-27 MED ORDER — IPRATROPIUM BROMIDE 0.06 % NA SOLN: 2.0000 | Freq: Four times a day (QID) | NASAL | Status: DC

## 2014-10-27 NOTE — ED Notes (Signed)
C/o sick x several days, sick every winter. Minimal relief w OTC medications

## 2014-10-27 NOTE — ED Provider Notes (Signed)
CSN: [id number]     Arrival date & time 10/27/14  1132 History   First MD Initiated Contact with Patient 10/27/14 1239     Chief Complaint  Patient presents with  . URI   (Consider location/radiation/quality/duration/timing/severity/associated sxs/prior Treatment) Patient is a 44 y.o. female presenting with URI. The history is provided by the patient.  URI Presenting symptoms: congestion, cough and rhinorrhea   Presenting symptoms: no fever and no sore throat   Severity:  Mild Onset quality:  Gradual Duration:  2 days Chronicity:  New Relieved by:  None tried Worsened by:  Nothing tried Risk factors: recent travel and sick contacts     Past Medical History  Diagnosis Date  . Anal fissure   . Depression   . Fibromyalgia   . GERD (gastroesophageal reflux disease)   . HLD (hyperlipidemia)   . History of nephrolithiasis   . Sjogren's syndrome   . Calculus of gallbladder without mention of cholecystitis or obstruction   . Arthralgia of temporomandibular joint   . Sjogren - Larsson's syndrome   . Kidney stones   . Heart murmur    Past Surgical History  Procedure Laterality Date  . Cholecystectomy  08/20/08  . Wisdom tooth extraction     Family History  Problem Relation Age of Onset  . Bone cancer      Grandmother  . Colon polyps Father   . Diabetes      Grandmother  . Heart disease      Grandmother   History  Substance Use Topics  . Smoking status: Never Smoker   . Smokeless tobacco: Never Used  . Alcohol Use: Yes     Comment: Socially-rare   OB History    No data available     Review of Systems  Constitutional: Negative.  Negative for fever.  HENT: Positive for congestion, postnasal drip and rhinorrhea. Negative for sore throat.   Respiratory: Positive for cough.   Cardiovascular: Negative.   Gastrointestinal: Negative.     Allergies  Albuterol; Sulfa antibiotics; and Sulfonamide derivatives  Home Medications   Prior to Admission medications    Medication Sig Start Date End Date Taking? Authorizing Provider  acetaminophen (TYLENOL) 500 MG tablet Take 1,000 mg by mouth every 8 (eight) hours as needed (pain).    Historical Provider, MD  aspirin-acetaminophen-caffeine (EXCEDRIN MIGRAINE) [phone] MG per tablet Take 2 tablets by mouth every 8 (eight) hours as needed for headache.    Historical Provider, MD  cetirizine-pseudoephedrine (ZYRTEC-D) 5-120 MG per tablet Take 1 tablet by mouth as needed.     Historical Provider, MD  cholecalciferol (VITAMIN D) 1000 UNITS tablet Take 1,000 Units by mouth daily.      Historical Provider, MD  fluticasone (FLONASE) 50 MCG/ACT nasal spray 2 SPRAYS IN EACH NOSTRIL DAILY FOR SINUS AND ALLERGY SYMPTOMS Patient taking differently: 2 SPRAYS IN EACH NOSTRIL DAILY FOR SINUS AND ALLERGY SYMPTOMS as needed 05/01/11   Lucille Passy, MD  ipratropium (ATROVENT) 0.06 % nasal spray Place 2 sprays into both nostrils 4 (four) times daily. 10/27/14   Billy Fischer, MD  levalbuterol [facility] HFA) 45 MCG/ACT inhaler Inhale 1-2 puffs into the lungs every 4 (four) hours as needed for wheezing or shortness of breath.     Historical Provider, MD  metoprolol succinate (TOPROL-XL) 25 MG 24 hr tablet Take 1 tablet (25 mg total) by mouth daily. 10/22/14   Wellington Hampshire, MD  omeprazole (PRILOSEC) 20 MG capsule Take 1 capsule (20 mg total)  by mouth daily. 02/08/14   Lucille Passy, MD  ranitidine (ZANTAC) 150 MG tablet Take 150 mg by mouth 2 (two) times daily as needed for heartburn.     Historical Provider, MD   BP 108/76 mmHg  Pulse 102  Temp(Src) 99.4 F (37.4 C) (Oral)  SpO2 99% Physical Exam  Constitutional: She is oriented to person, place, and time. She appears well-developed and well-nourished.  HENT:  Head: Normocephalic.  Right Ear: External ear normal.  Left Ear: External ear normal.  Mouth/Throat: Oropharynx is clear and moist.  Eyes: Pupils are equal, round, and reactive to light.  Neck: Normal range of motion.  Neck supple.  Cardiovascular: Normal rate, regular rhythm, normal heart sounds and intact distal pulses.   Pulmonary/Chest: Effort normal and breath sounds normal.  Lymphadenopathy:    She has no cervical adenopathy.  Neurological: She is alert and oriented to person, place, and time.  Skin: Skin is warm and dry.  Nursing note and vitals reviewed.   ED Course  Procedures (including critical care time) Labs Review Labs Reviewed - No data to display  Imaging Review No results found.   MDM   1. URI (upper respiratory infection)        Billy Fischer, MD 10/27/14 1310

## 2014-10-27 NOTE — Discharge Instructions (Signed)
Drink plenty of fluids as discussed, use medicine as prescribed, and mucinex or delsym for cough. Return or see your doctor if further problems °

## 2014-11-21 ENCOUNTER — Telehealth: Payer: Self-pay | Admitting: Cardiovascular Disease

## 2014-11-21 NOTE — Telephone Encounter (Signed)
Spoke w/ pt.  She reports that her HR was WNL after starting metoprolol, but has recently been going up.  Reports that her HR was "fluttery" and woke her up at 2am w/ rate of 108. HR 115 on waking this am. She reports soreness on the left side of her chest that she feels is r/t increased HR. Reports that she had a Diet Pepsi yesterday and HR was 70 after.  She states that she does not smoke and does not feel that caffeine or sugar affect her.  Advised her that I will make Dr. Fletcher Anon aware and call her back w/ his recommendation.  She is appreciative and will continue to monitor her HR and sx.

## 2014-11-21 NOTE — Telephone Encounter (Signed)
Patient has continued chest soreness on the left side of chest .  Patient was here in Feb.  Had holter that she said shows she is skipping beats.  Patient says her heart rate is elevated .  This is more frequent than it was prior to her appt. In February.  Please call patient to see if she shuld come in before scheduled fu in May.

## 2014-11-23 NOTE — Telephone Encounter (Signed)
Reviewed Dr. Jacklynn Ganong response with patient  Patient does not with to increase meds at this time  Reviewed calming techniques with patient  Patient will call Monday and let us know how she does over the weekend

## 2014-11-23 NOTE — Telephone Encounter (Signed)
We have the option of increasing Toprol to 50 mg once daily. However, blood pressure might go down and she might feel tired with that. It's normal to have some fluctuations in heart rate. Probably best to monitor her symptoms.

## 2014-12-03 ENCOUNTER — Other Ambulatory Visit: Payer: Self-pay

## 2014-12-03 MED ORDER — OMEPRAZOLE 20 MG PO CPDR: 20.0000 mg | DELAYED_RELEASE_CAPSULE | Freq: Every day | ORAL | Status: DC

## 2014-12-03 NOTE — Telephone Encounter (Signed)
Rx faxed to mail order

## 2014-12-03 NOTE — Telephone Encounter (Signed)
Pt does not have acct set up yet with [facility] and request 90 day supply of omeprazole rx done and call pt when ready for pick up; pt has cpx scheduled for 02/20/15.

## 2015-01-22 ENCOUNTER — Encounter: Payer: Self-pay | Admitting: Cardiovascular Disease

## 2015-01-22 ENCOUNTER — Ambulatory Visit (INDEPENDENT_AMBULATORY_CARE_PROVIDER_SITE_OTHER): Payer: BLUE CROSS/BLUE SHIELD | Admitting: Cardiovascular Disease

## 2015-01-22 VITALS — BP 108/71 | HR 78 | Ht 67.0 in | Wt 149.8 lb

## 2015-01-22 DIAGNOSIS — R0789 Other chest pain: Secondary | ICD-10-CM | POA: Diagnosis not present

## 2015-01-22 DIAGNOSIS — R000 Tachycardia, unspecified: Secondary | ICD-10-CM

## 2015-01-22 DIAGNOSIS — R079 Chest pain, unspecified: Secondary | ICD-10-CM | POA: Diagnosis not present

## 2015-01-22 NOTE — Patient Instructions (Signed)
Medication Instructions:  No changes today.  Labwork: None today.  Testing/Procedures: Your physician has requested that you have an exercise tolerance test. For further information please visit [email or web address]. Please also follow instruction sheet, as given.   Follow-Up: Your physician wants you to follow-up in: 6 months with Dr Fletcher Anon. (November 2016). You will receive a reminder letter in the mail two months in advance. If you don't receive a letter, please call our office to schedule the follow-up appointment.

## 2015-01-22 NOTE — Assessment & Plan Note (Addendum)
Patient had sinus tachycardia as well as PVCs and PACs. Symptoms improved with small dose Toprol. I do not recommend increasing the dose due to fatigue and relatively low blood pressure. She is going for her annual physical with Dr. Deborra Medina next month. I recommend repeating TSH and checking magnesium as well with her labs.

## 2015-01-22 NOTE — Assessment & Plan Note (Signed)
This is somewhat difficult to sort out due to fibromyalgia and a component of anxiety. She is describing different types of chest pain including soreness and tightness. I requested a treadmill stress test for evaluation.

## 2015-01-22 NOTE — Progress Notes (Signed)
Primary care physician: Dr. Deborra Medina  HPI  This is a 44 year old female who is here today for a follow-up visit regarding palpitations and tachycardia. She has known history of Sjogren's syndrome.  Echocardiogram showed normal LV systolic function with an ejection fraction of 60% with borderline prolapse of anterior mitral valve leaflet with only mild mitral regurgitation and no evidence of pulmonary hypertension. A Holter monitor showed normal sinus rhythm with occasional PVCs and PACs. She had a total of 445 PVCs and 1200 PACs. she was started on small dose Toprol with improvement in palpitations. She also cut down on caffeine intake. She is now complaining more of chest pain described as soreness and occasional tightness which usually happens at rest and not with physical activities. She does have fibromyalgia with tender points.  Allergies  Allergen Reactions  . Albuterol     REACTION: intolerant- "jittery" after use  . Sulfonamide Derivatives     REACTION: intolerant     Current Outpatient Prescriptions on File Prior to Visit  Medication Sig Dispense Refill  . acetaminophen (TYLENOL) 500 MG tablet Take 1,000 mg by mouth every 8 (eight) hours as needed (pain).    Marland Kitchen aspirin-acetaminophen-caffeine (EXCEDRIN MIGRAINE) 250-250-65 MG per tablet Take 2 tablets by mouth every 8 (eight) hours as needed for headache.    . cholecalciferol (VITAMIN D) 1000 UNITS tablet Take 1,000 Units by mouth daily.      . metoprolol succinate (TOPROL-XL) 25 MG 24 hr tablet Take 1 tablet (25 mg total) by mouth daily. 30 tablet 3  . omeprazole (PRILOSEC) 20 MG capsule Take 1 capsule (20 mg total) by mouth daily. 90 capsule 0  . ranitidine (ZANTAC) 150 MG tablet Take 150 mg by mouth 2 (two) times daily as needed for heartburn.      No current facility-administered medications on file prior to visit.     Past Medical History  Diagnosis Date  . Anal fissure   . Depression   . Fibromyalgia   . GERD  (gastroesophageal reflux disease)   . HLD (hyperlipidemia)   . History of nephrolithiasis   . Sjogren's syndrome   . Calculus of gallbladder without mention of cholecystitis or obstruction   . Arthralgia of temporomandibular joint   . Sjogren - Larsson's syndrome   . Kidney stones   . Heart murmur      Past Surgical History  Procedure Laterality Date  . Cholecystectomy  08/20/08  . Wisdom tooth extraction       Family History  Problem Relation Age of Onset  . Bone cancer      Grandmother  . Colon polyps Father   . Diabetes      Grandmother  . Heart disease      Grandmother     History   Social History  . Marital Status: Married    Spouse Name: N/A  . Number of Children: N/A  . Years of Education: N/A   Occupational History  . Not on file.   Social History Main Topics  . Smoking status: Never Smoker   . Smokeless tobacco: Never Used  . Alcohol Use: Yes     Comment: Socially-rare  . Drug Use: No  . Sexual Activity: Not on file   Other Topics Concern  . Not on file   Social History Narrative   Married      Caffeine: 2 cups/day      No regular exercise           ROS A 10  point review of system was performed. It is negative other than that mentioned in the history of present illness.   PHYSICAL EXAM   BP 108/71 mmHg  Pulse 78  Ht '5\' 7"'$  (1.702 m)  Wt 149 lb 12.8 oz (67.949 kg)  BMI 23.46 kg/m2 Constitutional: She is oriented to person, place, and time. She appears well-developed and well-nourished. No distress.  HENT: No nasal discharge.  Head: Normocephalic and atraumatic.  Eyes: Pupils are equal and round. No discharge.  Neck: Normal range of motion. Neck supple. No JVD present. No thyromegaly present.  Cardiovascular: Tachycardic, regular rhythm, normal heart sounds. Exam reveals no gallop and no friction rub. No murmur heard.  Pulmonary/Chest: Effort normal and breath sounds normal. No stridor. No respiratory distress. She has no wheezes.  She has no rales. She exhibits no tenderness.  Abdominal: Soft. Bowel sounds are normal. She exhibits no distension. There is no tenderness. There is no rebound and no guarding.  Musculoskeletal: Normal range of motion. She exhibits no edema and no tenderness.  Neurological: She is alert and oriented to person, place, and time. Coordination normal.  Skin: Skin is warm and dry. No rash noted. She is not diaphoretic. No erythema. No pallor.  Psychiatric: She has a normal mood and affect. Her behavior is normal. Judgment and thought content normal.       ASSESSMENT AND PLAN

## 2015-02-12 ENCOUNTER — Other Ambulatory Visit: Payer: Self-pay | Admitting: Cardiovascular Disease

## 2015-02-20 ENCOUNTER — Encounter: Payer: BLUE CROSS/BLUE SHIELD | Admitting: Family Medicine

## 2015-03-05 ENCOUNTER — Encounter: Payer: Self-pay | Admitting: Family Medicine

## 2015-03-05 ENCOUNTER — Other Ambulatory Visit: Payer: Self-pay | Admitting: Nurse Practitioner

## 2015-03-05 ENCOUNTER — Telehealth: Payer: Self-pay | Admitting: Family Medicine

## 2015-03-05 ENCOUNTER — Ambulatory Visit (INDEPENDENT_AMBULATORY_CARE_PROVIDER_SITE_OTHER): Payer: BLUE CROSS/BLUE SHIELD | Admitting: Family Medicine

## 2015-03-05 ENCOUNTER — Encounter (INDEPENDENT_AMBULATORY_CARE_PROVIDER_SITE_OTHER): Payer: BLUE CROSS/BLUE SHIELD | Admitting: Nurse Practitioner

## 2015-03-05 ENCOUNTER — Other Ambulatory Visit: Payer: Self-pay | Admitting: Cardiovascular Disease

## 2015-03-05 VITALS — BP 100/68 | HR 92 | Temp 98.5°F | Ht 67.0 in | Wt 152.5 lb

## 2015-03-05 DIAGNOSIS — I471 Supraventricular tachycardia, unspecified: Secondary | ICD-10-CM

## 2015-03-05 DIAGNOSIS — Z0000 Encounter for general adult medical examination without abnormal findings: Secondary | ICD-10-CM

## 2015-03-05 DIAGNOSIS — R079 Chest pain, unspecified: Secondary | ICD-10-CM

## 2015-03-05 DIAGNOSIS — K219 Gastro-esophageal reflux disease without esophagitis: Secondary | ICD-10-CM | POA: Diagnosis not present

## 2015-03-05 LAB — COMPREHENSIVE METABOLIC PANEL
ALBUMIN: 4 g/dL (ref 3.5–5.2)
ALT: 20 U/L (ref 0–35)
AST: 27 U/L (ref 0–37)
Alkaline Phosphatase: 47 U/L (ref 39–117)
BUN: 8 mg/dL (ref 6–23)
CALCIUM: 9.2 mg/dL (ref 8.4–10.5)
CO2: 29 meq/L (ref 19–32)
Chloride: 104 mEq/L (ref 96–112)
Creatinine, Ser: 0.76 mg/dL (ref 0.40–1.20)
GFR: 87.98 mL/min (ref >60.00)
GLUCOSE: 86 mg/dL (ref 70–99)
POTASSIUM: 4 meq/L (ref 3.5–5.1)
Sodium: 138 mEq/L (ref 135–145)
Total Bilirubin: 0.4 mg/dL (ref 0.2–1.2)
Total Protein: 8 g/dL (ref 6.0–8.3)

## 2015-03-05 LAB — CBC WITH DIFFERENTIAL/PLATELET
BASOS PCT: 0.5 % (ref 0.0–3.0)
Basophils Absolute: 0 10*3/uL (ref 0.0–0.1)
Eosinophils Absolute: 0.1 10*3/uL (ref 0.0–0.7)
Eosinophils Relative: 2.2 % (ref 0.0–5.0)
HCT: 44.1 % (ref 36.0–46.0)
Hemoglobin: 14.8 g/dL (ref 12.0–15.0)
Lymphocytes Relative: 38.3 % (ref 12.0–46.0)
Lymphs Abs: 1.4 10*3/uL (ref 0.7–4.0)
MCHC: 33.6 g/dL (ref 30.0–36.0)
MCV: 84.3 fl (ref 78.0–100.0)
Monocytes Absolute: 0.5 10*3/uL (ref 0.1–1.0)
Monocytes Relative: 11.9 % (ref 3.0–12.0)
NEUTROS PCT: 47.1 % (ref 43.0–77.0)
Neutro Abs: 1.8 10*3/uL (ref 1.4–7.7)
Platelets: 263 10*3/uL (ref 150.0–400.0)
RBC: 5.23 Mil/uL — AB (ref 3.87–5.11)
RDW: 14.3 % (ref 11.5–15.5)
WBC: 3.8 10*3/uL — AB (ref 4.0–10.5)

## 2015-03-05 LAB — EXERCISE TOLERANCE TEST
CSEPEW: 10.1 METS
CSEPHR: 93 %
Exercise duration (min): 9 min
MPHR: 177 {beats}/min
Peak HR: 164 {beats}/min
RPE: 17
Rest HR: 80 {beats}/min

## 2015-03-05 LAB — TSH: TSH: 2.24 u[IU]/mL (ref 0.35–4.50)

## 2015-03-05 LAB — LIPID PANEL
CHOL/HDL RATIO: 5
CHOLESTEROL: 202 mg/dL — AB (ref 0–200)
HDL: 44.2 mg/dL (ref >39.00)
LDL CALC: 137 mg/dL — AB (ref 0–99)
NonHDL: 157.8
TRIGLYCERIDES: 103 mg/dL (ref 0.0–149.0)
VLDL: 20.6 mg/dL (ref 0.0–40.0)

## 2015-03-05 LAB — VITAMIN B12: Vitamin B-12: 305 pg/mL (ref 211–911)

## 2015-03-05 LAB — MAGNESIUM: Magnesium: 2 mg/dL (ref 1.5–2.5)

## 2015-03-05 MED ORDER — PANTOPRAZOLE SODIUM 40 MG PO TBEC: 40.0000 mg | DELAYED_RELEASE_TABLET | Freq: Every day | ORAL | Status: DC

## 2015-03-05 MED ORDER — METOPROLOL SUCCINATE ER 25 MG PO TB24: ORAL_TABLET | ORAL | Status: DC

## 2015-03-05 NOTE — Progress Notes (Signed)
Pre visit review using our clinic review tool, if applicable. No additional management support is needed unless otherwise documented below in the visit note. 

## 2015-03-05 NOTE — Telephone Encounter (Signed)
Pt dropped off vitality check form biometric screening Pt wanted it faxed to the number on the paper  Put on Jill Leonard's desk

## 2015-03-05 NOTE — Progress Notes (Signed)
Subjective:   Patient ID: Jill Leonard, female    DOB: [birth year], 44 y.o.   MRN: [id number]  Jill Leonard is a pleasant 44 y.o. year old female who presents to clinic today with Annual Exam and follow up of chronic medical conditions on 03/05/2015  HPI: H/o sjorgren's. [id number]- 44 year old son. Mammogram 06/25/14. Pap 02/08/14- done by me. Not sexually active with her husband.  GERD- taking Raninditine, Prilosec 20 mg daily along with probiotic.  Stilling having significant reflux at night and asking if there is another PPI she can try.  Has failed Nexium in the past.  SVT- followed by Dr. Fletcher Anon.  Notes reviewed.  Toprol has helped- no longer feeling like her heart is racing.  Pulse a little elevated today but she didn't take her Toprol to prepare for stress test scheduled for tomorrow.    Sjorgrens- symptoms have been stable.   Patient Active Problem List   Diagnosis Date Noted  . Paroxysmal supraventricular tachycardia 03/05/2015  . Routine gynecological examination 02/08/2014  . Routine general medical examination at a health care facility 02/08/2014  . ABDOMINAL PAIN, SUPRAPUBIC 03/04/2010  . NEPHROLITHIASIS, HX OF 03/04/2010  . DYSPNEA 01/24/2010  . TMJ PAIN 07/06/2008  . CHOLELITHIASIS 07/04/2008  . GERD 06/21/2008  . [place] SYNDROME 06/21/2008  . [place] ELEVATION OF LEVELS OF TRANSAMINASE/LDH 06/21/2008   Past Medical History  Diagnosis Date  . Anal fissure   . Depression   . Fibromyalgia   . GERD (gastroesophageal reflux disease)   . HLD (hyperlipidemia)   . History of nephrolithiasis   . Sjogren's syndrome   . Calculus of gallbladder without mention of cholecystitis or obstruction   . Arthralgia of temporomandibular joint   . Sjogren - Larsson's syndrome   . Kidney stones   . Heart murmur    Past Surgical History  Procedure Laterality Date  . Cholecystectomy  08/20/08  . Wisdom tooth extraction     History  Substance Use Topics  . Smoking  status: Never Smoker   . Smokeless tobacco: Never Used  . Alcohol Use: 0.0 oz/week    0 Standard drinks or equivalent per week     Comment: Socially-rare   Family History  Problem Relation Age of Onset  . Bone cancer      Grandmother  . Colon polyps Father   . Diabetes      Grandmother  . Heart disease      Grandmother   Allergies  Allergen Reactions  . Albuterol     REACTION: intolerant- "jittery" after use  . Sulfonamide Derivatives     REACTION: intolerant   Current Outpatient Prescriptions on File Prior to Visit  Medication Sig Dispense Refill  . acetaminophen (TYLENOL) 500 MG tablet Take 1,000 mg by mouth every 8 (eight) hours as needed (pain).    Marland Kitchen aspirin-acetaminophen-caffeine (EXCEDRIN MIGRAINE) 250-250-65 MG per tablet Take 2 tablets by mouth every 8 (eight) hours as needed for headache.    . cholecalciferol (VITAMIN D) 1000 UNITS tablet Take 1,000 Units by mouth daily.      . fluticasone (FLONASE) 50 MCG/ACT nasal spray Place 2 sprays into both nostrils daily as needed for allergies or rhinitis.    . metoprolol succinate (TOPROL-XL) 25 MG 24 hr tablet TAKE 1 TABLET (25 MG TOTAL) BY MOUTH DAILY. 30 tablet 6  . omeprazole (PRILOSEC) 20 MG capsule Take 1 capsule (20 mg total) by mouth daily. 90 capsule 0  . Probiotic Product (PROBIOTIC DAILY PO)  Take 1 capsule by mouth daily.    . ranitidine (ZANTAC) 150 MG tablet Take 150 mg by mouth 2 (two) times daily as needed for heartburn.      No current facility-administered medications on file prior to visit.   The PMH, PSH, Social History, Family History, Medications, and allergies have been reviewed in [facility], and have been updated if relevant.   Review of Systems  Constitutional: Negative.   HENT: Negative.   Eyes: Negative.   Respiratory: Negative.   Cardiovascular: Negative.   Gastrointestinal: Positive for abdominal distention. Negative for nausea, vomiting, abdominal pain, diarrhea, constipation, blood in stool, anal  bleeding and rectal pain.  Endocrine: Negative.   Genitourinary: Negative.   Musculoskeletal: Negative.   Skin: Negative.   Allergic/Immunologic: Negative.   Neurological: Negative.   Hematological: Negative.   Psychiatric/Behavioral: Negative.   All other systems reviewed and are negative.       Objective:    BP 100/68 mmHg  Pulse 92  Temp(Src) 98.5 F (36.9 C) (Oral)  Ht '5\' 7"'$  (1.702 m)  Wt 152 lb 8 oz (69.174 kg)  BMI 23.88 kg/m2  LMP 03/05/2015  Wt Readings from Last 3 Encounters:  03/05/15 152 lb 8 oz (69.174 kg)  01/22/15 149 lb 12.8 oz (67.949 kg)  10/22/14 148 lb 8 oz (67.359 kg)     Physical Exam   General:  Well-developed,well-nourished,in no acute distress; alert,appropriate and cooperative throughout examination Head:  normocephalic and atraumatic.   Eyes:  vision grossly intact, pupils equal, pupils round, and pupils reactive to light.   Ears:  R ear normal and L ear normal.   Nose:  no external deformity.   Mouth:  good dentition.   Neck:  No deformities, masses, or tenderness noted. Breasts:  No mass, nodules, thickening, tenderness, bulging, retraction, inflamation, nipple discharge or skin changes noted.   Lungs:  Normal respiratory effort, chest expands symmetrically. Lungs are clear to auscultation, no crackles or wheezes. Heart:  Normal rate and regular rhythm. S1 and S2 normal without gallop, murmur, click, rub or other extra sounds. Abdomen:  Bowel sounds positive,abdomen soft and non-tender without masses, organomegaly or hernias noted. Msk:  No deformity or scoliosis noted of thoracic or lumbar spine.   Extremities:  No clubbing, cyanosis, edema, or deformity noted with normal full range of motion of all joints.   Neurologic:  alert & oriented X3 and gait normal.   Skin:  Intact without suspicious lesions or rashes Cervical Nodes:  No lymphadenopathy noted Axillary Nodes:  No palpable lymphadenopathy Psych:  Cognition and judgment appear  intact. Alert and cooperative with normal attention span and concentration. No apparent delusions, illusions, hallucinations   Assessment & Plan:   Routine general medical examination at a health care facility - Plan: Comprehensive metabolic panel, CBC with Differential/Platelet, Lipid panel, Vitamin B12  Gastroesophageal reflux disease, esophagitis presence not specified  Paroxysmal supraventricular tachycardia - Plan: TSH, Magnesium No Follow-up on file.

## 2015-03-05 NOTE — Assessment & Plan Note (Signed)
Followed by cardiology. Stress test scheduled for tomorrow.

## 2015-03-05 NOTE — Assessment & Plan Note (Signed)
Reviewed preventive care protocols, scheduled due services, and updated immunizations Discussed nutrition, exercise, diet, and healthy lifestyle.  Orders Placed This Encounter  Procedures  . Comprehensive metabolic panel  . CBC with Differential/Platelet  . Lipid panel  . TSH  . Magnesium  . Vitamin B12

## 2015-03-05 NOTE — Assessment & Plan Note (Signed)
Remains poorly controlled. D/c prilosec. eRx sent for protonix 40 mg daily, continue ranintidine as well.

## 2015-03-05 NOTE — Patient Instructions (Signed)
Great to see you. Please update me with how the protonix is working. We will call you with your lab results and you can view them online.

## 2015-03-06 ENCOUNTER — Encounter: Payer: Self-pay | Admitting: *Deleted

## 2015-03-12 ENCOUNTER — Other Ambulatory Visit: Payer: Self-pay

## 2015-03-12 MED ORDER — METOPROLOL SUCCINATE ER 25 MG PO TB24: ORAL_TABLET | ORAL | Status: DC

## 2015-03-13 ENCOUNTER — Telehealth: Payer: Self-pay | Admitting: Family Medicine

## 2015-03-13 ENCOUNTER — Encounter: Payer: Self-pay | Admitting: Cardiovascular Disease

## 2015-03-13 NOTE — Telephone Encounter (Signed)
Lm on pts vm and informed her that I had not seen a form for her, that was to be completed. Provided pt with fax number and advised her she may either fax form or drop off at the office for completion. Pt also advised Dr Deborra Medina does not RTO until 03/21/15

## 2015-03-13 NOTE — Telephone Encounter (Signed)
It was on your desk that day when I left.

## 2015-03-13 NOTE — Telephone Encounter (Signed)
This encounter was created in error - please disregard.

## 2015-03-13 NOTE — Telephone Encounter (Signed)
New Message  Pt called req a call back to discuss stress test results

## 2015-03-13 NOTE — Telephone Encounter (Signed)
Patient dropped off a form on 03/05/15.  It's a [employer] Biometric Screening. Form was placed on Jill Leonard's desk.  She wants to know if form was faxed.  Please call patient back.

## 2015-03-13 NOTE — Telephone Encounter (Signed)
i was not here on 6/21 and have not seen a form to be completed for this pt. Rollene Fare, have you seen this form, as you worked with her that day?

## 2015-04-05 NOTE — Telephone Encounter (Signed)
Pt is going to faxed over vitality biometric screen form for dr Deborra Medina to fill out Please let pt know when this has been done

## 2015-04-22 ENCOUNTER — Encounter: Payer: Self-pay | Admitting: Internal Medicine

## 2015-07-08 ENCOUNTER — Other Ambulatory Visit: Payer: Self-pay | Admitting: Family Medicine

## 2015-07-24 ENCOUNTER — Ambulatory Visit (INDEPENDENT_AMBULATORY_CARE_PROVIDER_SITE_OTHER): Payer: BLUE CROSS/BLUE SHIELD | Admitting: Primary Care

## 2015-07-24 ENCOUNTER — Encounter: Payer: Self-pay | Admitting: Primary Care

## 2015-07-24 VITALS — BP 102/68 | HR 82 | Temp 97.7°F | Ht 67.0 in | Wt 162.1 lb

## 2015-07-24 DIAGNOSIS — R350 Frequency of micturition: Secondary | ICD-10-CM

## 2015-07-24 DIAGNOSIS — N390 Urinary tract infection, site not specified: Secondary | ICD-10-CM | POA: Diagnosis not present

## 2015-07-24 LAB — POCT URINALYSIS DIPSTICK
Bilirubin, UA: NEGATIVE
Blood, UA: NEGATIVE
Glucose, UA: NEGATIVE
KETONES UA: NEGATIVE
Nitrite, UA: NEGATIVE
PH UA: 6.5
Protein, UA: NEGATIVE
SPEC GRAV UA: 1.01
Urobilinogen, UA: NEGATIVE

## 2015-07-24 MED ORDER — CEPHALEXIN 500 MG PO CAPS
500.0000 mg | ORAL_CAPSULE | Freq: Two times a day (BID) | ORAL | Status: DC
Start: 2015-07-24 — End: 2015-08-13

## 2015-07-24 NOTE — Patient Instructions (Signed)
Start Cephalexin antibiotics. Take 1 capsule by mouth twice daily for 7 days.  Increase consumption of water to help flush out bacteria and to stay hydrated.  Please call us if no improvement in 3-4 days.  It was a pleasure meeting you!  Urinary Tract Infection Urinary tract infections (UTIs) can develop anywhere along your urinary tract. Your urinary tract is your body's drainage system for removing wastes and extra water. Your urinary tract includes two kidneys, two ureters, a bladder, and a urethra. Your kidneys are a pair of bean-shaped organs. Each kidney is about the size of your fist. They are located below your ribs, one on each side of your spine. CAUSES Infections are caused by microbes, which are microscopic organisms, including fungi, viruses, and bacteria. These organisms are so small that they can only be seen through a microscope. Bacteria are the microbes that most commonly cause UTIs. SYMPTOMS  Symptoms of UTIs may vary by age and gender of the patient and by the location of the infection. Symptoms in young women typically include a frequent and intense urge to urinate and a painful, burning feeling in the bladder or urethra during urination. Older women and men are more likely to be tired, shaky, and weak and have muscle aches and abdominal pain. A fever may mean the infection is in your kidneys. Other symptoms of a kidney infection include pain in your back or sides below the ribs, nausea, and vomiting. DIAGNOSIS To diagnose a UTI, your caregiver will ask you about your symptoms. Your caregiver will also ask you to provide a urine sample. The urine sample will be tested for bacteria and white blood cells. White blood cells are made by your body to help fight infection. TREATMENT  Typically, UTIs can be treated with medication. Because most UTIs are caused by a bacterial infection, they usually can be treated with the use of antibiotics. The choice of antibiotic and length of  treatment depend on your symptoms and the type of bacteria causing your infection. HOME CARE INSTRUCTIONS  If you were prescribed antibiotics, take them exactly as your caregiver instructs you. Finish the medication even if you feel better after you have only taken some of the medication.  Drink enough water and fluids to keep your urine clear or pale yellow.  Avoid caffeine, tea, and carbonated beverages. They tend to irritate your bladder.  Empty your bladder often. Avoid holding urine for long periods of time.  Empty your bladder before and after sexual intercourse.  After a bowel movement, women should cleanse from front to back. Use each tissue only once. SEEK MEDICAL CARE IF:   You have back pain.  You develop a fever.  Your symptoms do not begin to resolve within 3 days. SEEK IMMEDIATE MEDICAL CARE IF:   You have severe back pain or lower abdominal pain.  You develop chills.  You have nausea or vomiting.  You have continued burning or discomfort with urination. MAKE SURE YOU:   Understand these instructions.  Will watch your condition.  Will get help right away if you are not doing well or get worse.   This information is not intended to replace advice given to you by your health care provider. Make sure you discuss any questions you have with your health care provider.   Document Released: 06/10/2005 Document Revised: 05/22/2015 Document Reviewed: 10/09/2011 Elsevier Interactive Patient Education [employer].

## 2015-07-24 NOTE — Progress Notes (Signed)
Pre visit review using our clinic review tool, if applicable. No additional management support is needed unless otherwise documented below in the visit note. 

## 2015-07-24 NOTE — Progress Notes (Signed)
Subjective:    Patient ID: Jill Leonard, female    DOB: 02-04-1971, 44 y.o.   MRN: [id number]  HPI  Jill Leonard is a 44 year old female who presents today with a chief complaint of urinary frequency. She also reports symptoms of lower back pain. Her symptoms have been present for the past 2 weeks. Friday night she woke up with increased back pain. She's taken tylenol with some relief. She denies dysuria, fevers, hematuria, recent injury. She had these symptoms before with her previous UTI,  Review of Systems  Constitutional: Negative for fever and chills.  Genitourinary: Positive for frequency and flank pain. Negative for dysuria, vaginal discharge and difficulty urinating.       Foul odor to urine.        Past Medical History  Diagnosis Date  . Anal fissure   . Depression   . Fibromyalgia   . GERD (gastroesophageal reflux disease)   . HLD (hyperlipidemia)   . History of nephrolithiasis   . Sjogren's syndrome ([place])   . Calculus of gallbladder without mention of cholecystitis or obstruction   . Arthralgia of temporomandibular joint   . Sjogren - Larsson's syndrome   . Kidney stones   . Heart murmur     Social History   Social History  . Marital Status: Married    Spouse Name: N/A  . Number of Children: N/A  . Years of Education: N/A   Occupational History  . Not on file.   Social History Main Topics  . Smoking status: Never Smoker   . Smokeless tobacco: Never Used  . Alcohol Use: 0.0 oz/week    0 Standard drinks or equivalent per week     Comment: Socially-rare  . Drug Use: No  . Sexual Activity: Not on file   Other Topics Concern  . Not on file   Social History Narrative   Married      Caffeine: 2 cups/day      No regular exercise          Past Surgical History  Procedure Laterality Date  . Cholecystectomy  08/20/08  . Wisdom tooth extraction      Family History  Problem Relation Age of Onset  . Bone cancer      Grandmother  . Colon  polyps Father   . Diabetes      Grandmother  . Heart disease      Grandmother    Allergies  Allergen Reactions  . Albuterol     REACTION: intolerant- "jittery" after use  . Sulfonamide Derivatives     REACTION: intolerant    Current Outpatient Prescriptions on File Prior to Visit  Medication Sig Dispense Refill  . acetaminophen (TYLENOL) 500 MG tablet Take 1,000 mg by mouth every 8 (eight) hours as needed (pain).    Marland Kitchen aspirin-acetaminophen-caffeine (EXCEDRIN MIGRAINE) 250-250-65 MG per tablet Take 2 tablets by mouth every 8 (eight) hours as needed for headache.    . cholecalciferol (VITAMIN D) 1000 UNITS tablet Take 1,000 Units by mouth daily.      . fluticasone (FLONASE) 50 MCG/ACT nasal spray Place 2 sprays into both nostrils daily as needed for allergies or rhinitis.    . metoprolol succinate (TOPROL-XL) 25 MG 24 hr tablet TAKE 1 TABLET (25 MG TOTAL) BY MOUTH DAILY. 90 tablet 3  . pantoprazole (PROTONIX) 40 MG tablet TAKE 1 TABLET (40 MG TOTAL) BY MOUTH DAILY. 30 tablet 3  . Probiotic Product (PROBIOTIC DAILY PO) Take 1 capsule  by mouth daily.    . ranitidine (ZANTAC) 150 MG tablet Take 150 mg by mouth 2 (two) times daily as needed for heartburn.      No current facility-administered medications on file prior to visit.    BP 102/68 mmHg  Pulse 82  Temp(Src) 97.7 F (36.5 C) (Oral)  Ht '5\' 7"'$  (1.702 m)  Wt 162 lb 1.9 oz (73.537 kg)  BMI 25.39 kg/m2  SpO2 95%  LMP 07/19/2015    Objective:   Physical Exam  Constitutional: She appears well-nourished.  Cardiovascular: Normal rate and regular rhythm.   Pulmonary/Chest: Effort normal and breath sounds normal.  Abdominal: There is no CVA tenderness.  Skin: Skin is warm and dry.          Assessment & Plan:  Urinary Tract Infection:  Urinary frequency and low back pain x 2 weeks. Also foul smelling odor. No vaginal symptoms, no abdominal pain. UA: 1+ leuks, no blood or nitrites. Culture sent. Will treat with  Cephalexin course. Push fluids, rest.  Follow up PRN.

## 2015-07-25 LAB — URINE CULTURE: Colony Count: 2000

## 2015-08-13 ENCOUNTER — Encounter: Payer: Self-pay | Admitting: Cardiovascular Disease

## 2015-08-13 ENCOUNTER — Ambulatory Visit (INDEPENDENT_AMBULATORY_CARE_PROVIDER_SITE_OTHER): Payer: BLUE CROSS/BLUE SHIELD | Admitting: Cardiovascular Disease

## 2015-08-13 VITALS — BP 108/60 | HR 83 | Ht 67.0 in | Wt 163.8 lb

## 2015-08-13 DIAGNOSIS — R002 Palpitations: Secondary | ICD-10-CM | POA: Diagnosis not present

## 2015-08-13 MED ORDER — METOPROLOL SUCCINATE ER 25 MG PO TB24: 12.5000 mg | ORAL_TABLET | Freq: Every day | ORAL | Status: DC

## 2015-08-13 NOTE — Progress Notes (Signed)
Primary care physician: Dr. Deborra Medina  HPI  This is a 44 year old female who is here today for a follow-up visit regarding palpitations and tachycardia. She has known history of Sjogren's syndrome.  Echocardiogram in January 2016 showed normal LV systolic function with an ejection fraction of 60% with borderline prolapse of anterior mitral valve leaflet with only mild mitral regurgitation and no evidence of pulmonary hypertension. A Holter monitor showed normal sinus rhythm with occasional PVCs and PACs. She had a total of 445 PVCs and 1200 PACs. she was started on small dose Toprol with improvement in palpitations. She also cut down on caffeine intake.  She has been doing well overall with resolution of palpitations. She had a treadmill stress test done in June which was normal.  Allergies  Allergen Reactions  . Albuterol     REACTION: intolerant- "jittery" after use  . Sulfonamide Derivatives     REACTION: intolerant     Current Outpatient Prescriptions on File Prior to Visit  Medication Sig Dispense Refill  . acetaminophen (TYLENOL) 500 MG tablet Take 1,000 mg by mouth every 8 (eight) hours as needed (pain).    Marland Kitchen aspirin-acetaminophen-caffeine (EXCEDRIN MIGRAINE) 250-250-65 MG per tablet Take 2 tablets by mouth every 8 (eight) hours as needed for headache.    . cholecalciferol (VITAMIN D) 1000 UNITS tablet Take 1,000 Units by mouth daily.      . fluticasone (FLONASE) 50 MCG/ACT nasal spray Place 2 sprays into both nostrils daily as needed for allergies or rhinitis.    . metoprolol succinate (TOPROL-XL) 25 MG 24 hr tablet TAKE 1 TABLET (25 MG TOTAL) BY MOUTH DAILY. 90 tablet 3  . pantoprazole (PROTONIX) 40 MG tablet TAKE 1 TABLET (40 MG TOTAL) BY MOUTH DAILY. 30 tablet 3  . Probiotic Product (PROBIOTIC DAILY PO) Take 1 capsule by mouth daily.    . ranitidine (ZANTAC) 150 MG tablet Take 150 mg by mouth 2 (two) times daily as needed for heartburn.      No current facility-administered  medications on file prior to visit.     Past Medical History  Diagnosis Date  . Anal fissure   . Depression   . Fibromyalgia   . GERD (gastroesophageal reflux disease)   . HLD (hyperlipidemia)   . History of nephrolithiasis   . Sjogren's syndrome ([place])   . Calculus of gallbladder without mention of cholecystitis or obstruction   . Arthralgia of temporomandibular joint   . Sjogren - Larsson's syndrome   . Kidney stones   . Heart murmur      Past Surgical History  Procedure Laterality Date  . Cholecystectomy  08/20/08  . Wisdom tooth extraction       Family History  Problem Relation Age of Onset  . Bone cancer      Grandmother  . Colon polyps Father   . Diabetes      Grandmother  . Heart disease      Grandmother     Social History   Social History  . Marital Status: Married    Spouse Name: N/A  . Number of Children: N/A  . Years of Education: N/A   Occupational History  . Not on file.   Social History Main Topics  . Smoking status: Never Smoker   . Smokeless tobacco: Never Used  . Alcohol Use: 0.0 oz/week    0 Standard drinks or equivalent per week     Comment: Socially-rare  . Drug Use: No  . Sexual Activity: Not on file  Other Topics Concern  . Not on file   Social History Narrative   Married      Caffeine: 2 cups/day      No regular exercise           ROS A 10 point review of system was performed. It is negative other than that mentioned in the history of present illness.   PHYSICAL EXAM   BP 108/60 mmHg  Pulse 83  Ht '5\' 7"'$  (1.702 m)  Wt 163 lb 12.8 oz (74.299 kg)  BMI 25.65 kg/m2  LMP 07/19/2015 Constitutional: She is oriented to person, place, and time. She appears well-developed and well-nourished. No distress.  HENT: No nasal discharge.  Head: Normocephalic and atraumatic.  Eyes: Pupils are equal and round. No discharge.  Neck: Normal range of motion. Neck supple. No JVD present. No thyromegaly present.  Cardiovascular:  Tachycardic, regular rhythm, normal heart sounds. Exam reveals no gallop and no friction rub. No murmur heard.  Pulmonary/Chest: Effort normal and breath sounds normal. No stridor. No respiratory distress. She has no wheezes. She has no rales. She exhibits no tenderness.  Abdominal: Soft. Bowel sounds are normal. She exhibits no distension. There is no tenderness. There is no rebound and no guarding.  Musculoskeletal: Normal range of motion. She exhibits no edema and no tenderness.  Neurological: She is alert and oriented to person, place, and time. Coordination normal.  Skin: Skin is warm and dry. No rash noted. She is not diaphoretic. No erythema. No pallor.  Psychiatric: She has a normal mood and affect. Her behavior is normal. Judgment and thought content normal.    EKG: Normal sinus rhythm with no significant ST or T wave changes.   ASSESSMENT AND PLAN

## 2015-08-13 NOTE — Patient Instructions (Signed)
Medication Instructions:  Your physician has recommended you make the following change in your medication:  1. DECREASE Metoprolol Succinate to '25mg'$  take one-half tablet by mouth daily  Labwork: No new orders.   Testing/Procedures: No new orders.   Follow-Up: Your physician wants you to follow-up in: 6 MONTHS with Dr Fletcher Anon.  You will receive a reminder letter in the mail two months in advance. If you don't receive a letter, please call our office to schedule the follow-up appointment.   Any Other Special Instructions Will Be Listed Below (If Applicable).     If you need a refill on your cardiac medications before your next appointment, please call your pharmacy.

## 2015-08-18 DIAGNOSIS — R002 Palpitations: Secondary | ICD-10-CM | POA: Insufficient documentation

## 2015-08-18 NOTE — Assessment & Plan Note (Signed)
Patient had sinus tachycardia as well as PVCs and PACs. Symptoms improved with small dose Toprol. She now reports resolution of symptoms. She was wondering if she can come off metoprolol. I advised her to cut the dose down to 12.5 mg once daily for now. This can be stopped down the road if she continues to be asymptomatic. Her cardiac workup otherwise has been reassuring.

## 2015-08-27 ENCOUNTER — Ambulatory Visit (INDEPENDENT_AMBULATORY_CARE_PROVIDER_SITE_OTHER): Payer: BLUE CROSS/BLUE SHIELD | Admitting: Family Medicine

## 2015-08-27 ENCOUNTER — Encounter: Payer: Self-pay | Admitting: Family Medicine

## 2015-08-27 VITALS — BP 122/74 | HR 93 | Temp 98.1°F | Wt 163.8 lb

## 2015-08-27 DIAGNOSIS — J029 Acute pharyngitis, unspecified: Secondary | ICD-10-CM | POA: Diagnosis not present

## 2015-08-27 DIAGNOSIS — J069 Acute upper respiratory infection, unspecified: Secondary | ICD-10-CM | POA: Diagnosis not present

## 2015-08-27 MED ORDER — AMOXICILLIN-POT CLAVULANATE 875-125 MG PO TABS: 1.0000 | ORAL_TABLET | Freq: Two times a day (BID) | ORAL | Status: DC

## 2015-08-27 NOTE — Progress Notes (Signed)
Pre visit review using our clinic review tool, if applicable. No additional management support is needed unless otherwise documented below in the visit note. 

## 2015-08-27 NOTE — Progress Notes (Signed)
SUBJECTIVE:  Jill Leonard is a 44 y.o. female who complains of coryza, congestion, sore throat, nasal blockage, headache, bilateral sinus pain and fever for 8 days. She denies a history of anorexia and chest pain and denies a history of asthma. Patient denies smoke cigarettes.   Current Outpatient Prescriptions on File Prior to Visit  Medication Sig Dispense Refill  . acetaminophen (TYLENOL) 500 MG tablet Take 1,000 mg by mouth every 8 (eight) hours as needed (pain).    Marland Kitchen aspirin-acetaminophen-caffeine (EXCEDRIN MIGRAINE) 250-250-65 MG per tablet Take 2 tablets by mouth every 8 (eight) hours as needed for headache.    . cholecalciferol (VITAMIN D) 1000 UNITS tablet Take 1,000 Units by mouth daily.      . fluticasone (FLONASE) 50 MCG/ACT nasal spray Place 2 sprays into both nostrils daily as needed for allergies or rhinitis.    . metoprolol succinate (TOPROL-XL) 25 MG 24 hr tablet Take 0.5 tablets (12.5 mg total) by mouth daily. 45 tablet 3  . pantoprazole (PROTONIX) 40 MG tablet TAKE 1 TABLET (40 MG TOTAL) BY MOUTH DAILY. 30 tablet 3  . Probiotic Product (PROBIOTIC DAILY PO) Take 1 capsule by mouth daily.    . ranitidine (ZANTAC) 150 MG tablet Take 150 mg by mouth 2 (two) times daily as needed for heartburn.      No current facility-administered medications on file prior to visit.    Allergies  Allergen Reactions  . Albuterol     REACTION: intolerant- "jittery" after use  . Sulfonamide Derivatives     REACTION: intolerant    Past Medical History  Diagnosis Date  . Anal fissure   . Depression   . Fibromyalgia   . GERD (gastroesophageal reflux disease)   . HLD (hyperlipidemia)   . History of nephrolithiasis   . Sjogren's syndrome ([place])   . Calculus of gallbladder without mention of cholecystitis or obstruction   . Arthralgia of temporomandibular joint   . Sjogren - Larsson's syndrome   . Kidney stones   . Heart murmur     Past Surgical History  Procedure Laterality Date   . Cholecystectomy  08/20/08  . Wisdom tooth extraction      Family History  Problem Relation Age of Onset  . Bone cancer      Grandmother  . Colon polyps Father   . Diabetes      Grandmother  . Heart disease      Grandmother    Social History   Social History  . Marital Status: Married    Spouse Name: N/A  . Number of Children: N/A  . Years of Education: N/A   Occupational History  . Not on file.   Social History Main Topics  . Smoking status: Never Smoker   . Smokeless tobacco: Never Used  . Alcohol Use: 0.0 oz/week    0 Standard drinks or equivalent per week     Comment: Socially-rare  . Drug Use: No  . Sexual Activity: Not on file   Other Topics Concern  . Not on file   Social History Narrative   Married      Caffeine: 2 cups/day      No regular exercise         The PMH, PSH, Social History, Family History, Medications, and allergies have been reviewed in [facility], and have been updated if relevant.  OBJECTIVE: BP 122/74 mmHg  Pulse 93  Temp(Src) 98.1 F (36.7 C) (Oral)  Wt 163 lb 12 oz (74.277 kg)  SpO2  97%  LMP 08/21/2015  She appears well, vital signs are as noted. Ears normal.  Throat and pharynx normal.  Neck supple. No adenopathy in the neck. Nose is congested. Sinuses tender. The chest is clear, without wheezes or rales.  ASSESSMENT:  sinusitis  PLAN: Given duration and progression of symptoms, will treat for bacterial sinusitis with course of Augmentin. Symptomatic therapy suggested: push fluids, rest and return office visit prn if symptoms persist or worsen.  Call or return to clinic prn if these symptoms worsen or fail to improve as anticipated.

## 2015-09-13 ENCOUNTER — Other Ambulatory Visit: Payer: Self-pay | Admitting: *Deleted

## 2015-09-13 MED ORDER — PANTOPRAZOLE SODIUM 40 MG PO TBEC: DELAYED_RELEASE_TABLET | ORAL | Status: DC

## 2015-11-29 ENCOUNTER — Encounter: Payer: Self-pay | Admitting: Family Medicine

## 2015-12-06 ENCOUNTER — Telehealth: Payer: Self-pay | Admitting: Cardiovascular Disease

## 2015-12-06 NOTE — Telephone Encounter (Signed)
Fine to stop Toprol. Given that she is on a very small dose, no weaning is required. She can just stop it.

## 2015-12-06 NOTE — Telephone Encounter (Signed)
Recommendations communicated to patient, who voiced understanding. She is informed to call if return of palpitations.

## 2015-12-06 NOTE — Telephone Encounter (Signed)
Pt of Dr. Fletcher Anon  Note hx of Sjogren's syndrome, palpitations, tachycardia.  Palpitations eval'd by Holter monitor, noted PVCs & occasional PACs. Last echo Jan 2016 - EF 60% "with borderline prolapse of anterior mitral valve leaflet with only mild mitral regurgitation and no evidence of pulmonary hypertension".  Last OV 08/13/15. Metoprolol decreased from '25mg'$  to 12.'5mg'$  daily at that time.  Pt calling today to discuss metoprolol dc/wean. She notes "occasional palpitations but "not nearly as often". She notes resting HR of 58, usually 115-120 w/ exercise. She has returned to routine physical exercise w/in past 2-3 months and is riding recumbent bicycle several times weekly.  Notes at last appt, discussion was made about possibly stopping metoprolol or trial discontinuation. She wanted to check w/ Dr. Fletcher Anon before doing so.

## 2015-12-06 NOTE — Telephone Encounter (Signed)
Pt called in wanting to speak with Dr. Fletcher Anon about possibly stopping her Toprol or if she can begin to taper off of this medication. Please f/u with her  Thanks

## 2015-12-25 DIAGNOSIS — D1801 Hemangioma of skin and subcutaneous tissue: Secondary | ICD-10-CM | POA: Diagnosis not present

## 2015-12-25 DIAGNOSIS — L821 Other seborrheic keratosis: Secondary | ICD-10-CM | POA: Diagnosis not present

## 2015-12-25 DIAGNOSIS — L812 Freckles: Secondary | ICD-10-CM | POA: Diagnosis not present

## 2015-12-25 DIAGNOSIS — L919 Hypertrophic disorder of the skin, unspecified: Secondary | ICD-10-CM | POA: Diagnosis not present

## 2016-02-13 DIAGNOSIS — H52203 Unspecified astigmatism, bilateral: Secondary | ICD-10-CM | POA: Diagnosis not present

## 2016-02-13 DIAGNOSIS — H04123 Dry eye syndrome of bilateral lacrimal glands: Secondary | ICD-10-CM | POA: Diagnosis not present

## 2016-03-18 ENCOUNTER — Encounter: Payer: Self-pay | Admitting: Family Medicine

## 2016-03-18 ENCOUNTER — Encounter: Payer: Self-pay | Admitting: *Deleted

## 2016-03-18 ENCOUNTER — Ambulatory Visit (INDEPENDENT_AMBULATORY_CARE_PROVIDER_SITE_OTHER): Payer: BLUE CROSS/BLUE SHIELD | Admitting: Family Medicine

## 2016-03-18 ENCOUNTER — Other Ambulatory Visit (HOSPITAL_COMMUNITY)
Admission: RE | Admit: 2016-03-18 | Discharge: 2016-03-18 | Disposition: A | Discharge status: Home / Self Care | Payer: BLUE CROSS/BLUE SHIELD | Source: Ambulatory Visit | Attending: Family Medicine | Admitting: Family Medicine

## 2016-03-18 VITALS — BP 108/66 | HR 82 | Temp 97.9°F | Ht 67.0 in | Wt 154.8 lb

## 2016-03-18 DIAGNOSIS — F411 Generalized anxiety disorder: Secondary | ICD-10-CM

## 2016-03-18 DIAGNOSIS — K219 Gastro-esophageal reflux disease without esophagitis: Secondary | ICD-10-CM | POA: Diagnosis not present

## 2016-03-18 DIAGNOSIS — Z0000 Encounter for general adult medical examination without abnormal findings: Secondary | ICD-10-CM

## 2016-03-18 DIAGNOSIS — M3500 Sicca syndrome, unspecified: Secondary | ICD-10-CM | POA: Diagnosis not present

## 2016-03-18 DIAGNOSIS — R0602 Shortness of breath: Secondary | ICD-10-CM | POA: Diagnosis not present

## 2016-03-18 DIAGNOSIS — Z1151 Encounter for screening for human papillomavirus (HPV): Secondary | ICD-10-CM | POA: Insufficient documentation

## 2016-03-18 DIAGNOSIS — Z0001 Encounter for general adult medical examination with abnormal findings: Secondary | ICD-10-CM | POA: Diagnosis not present

## 2016-03-18 DIAGNOSIS — Z01419 Encounter for gynecological examination (general) (routine) without abnormal findings: Secondary | ICD-10-CM | POA: Insufficient documentation

## 2016-03-18 DIAGNOSIS — I471 Supraventricular tachycardia: Secondary | ICD-10-CM

## 2016-03-18 LAB — CBC WITH DIFFERENTIAL/PLATELET
BASOS ABS: 0 10*3/uL (ref 0.0–0.1)
Basophils Relative: 0.5 % (ref 0.0–3.0)
EOS ABS: 0.2 10*3/uL (ref 0.0–0.7)
Eosinophils Relative: 3 % (ref 0.0–5.0)
HCT: 43.9 % (ref 36.0–46.0)
Hemoglobin: 14.9 g/dL (ref 12.0–15.0)
LYMPHS ABS: 2 10*3/uL (ref 0.7–4.0)
Lymphocytes Relative: 38 % (ref 12.0–46.0)
MCHC: 33.9 g/dL (ref 30.0–36.0)
MCV: 82.5 fl (ref 78.0–100.0)
MONO ABS: 0.5 10*3/uL (ref 0.1–1.0)
MONOS PCT: 9.7 % (ref 3.0–12.0)
NEUTROS ABS: 2.5 10*3/uL (ref 1.4–7.7)
NEUTROS PCT: 48.8 % (ref 43.0–77.0)
PLATELETS: 283 10*3/uL (ref 150.0–400.0)
RBC: 5.33 Mil/uL — AB (ref 3.87–5.11)
RDW: 14.6 % (ref 11.5–15.5)
WBC: 5.1 10*3/uL (ref 4.0–10.5)

## 2016-03-18 LAB — LIPID PANEL
CHOL/HDL RATIO: 4
Cholesterol: 196 mg/dL (ref 0–200)
HDL: 46.6 mg/dL (ref >39.00)
LDL Cholesterol: 134 mg/dL — ABNORMAL HIGH (ref 0–99)
NONHDL: 149.38
Triglycerides: 76 mg/dL (ref 0.0–149.0)
VLDL: 15.2 mg/dL (ref 0.0–40.0)

## 2016-03-18 LAB — COMPREHENSIVE METABOLIC PANEL
ALK PHOS: 50 U/L (ref 39–117)
ALT: 13 U/L (ref 0–35)
AST: 24 U/L (ref 0–37)
Albumin: 4.2 g/dL (ref 3.5–5.2)
BILIRUBIN TOTAL: 0.3 mg/dL (ref 0.2–1.2)
BUN: 10 mg/dL (ref 6–23)
CO2: 30 meq/L (ref 19–32)
Calcium: 9.5 mg/dL (ref 8.4–10.5)
Chloride: 105 mEq/L (ref 96–112)
Creatinine, Ser: 0.79 mg/dL (ref 0.40–1.20)
GFR: 83.74 mL/min (ref >60.00)
GLUCOSE: 89 mg/dL (ref 70–99)
Potassium: 3.8 mEq/L (ref 3.5–5.1)
SODIUM: 138 meq/L (ref 135–145)
TOTAL PROTEIN: 8.2 g/dL (ref 6.0–8.3)

## 2016-03-18 LAB — TSH: TSH: 1.8 u[IU]/mL (ref 0.35–4.50)

## 2016-03-18 LAB — H. PYLORI ANTIBODY, IGG: H PYLORI IGG: NEGATIVE

## 2016-03-18 MED ORDER — CLONAZEPAM 1 MG PO TABS: 1.0000 mg | ORAL_TABLET | Freq: Two times a day (BID) | ORAL | Status: DC | PRN

## 2016-03-18 NOTE — Addendum Note (Signed)
Addended by: Marchia Bond on: 03/18/2016 08:53 AM   Modules accepted: Miquel Dunn

## 2016-03-18 NOTE — Addendum Note (Signed)
Addended by: Modena Nunnery on: 03/18/2016 08:50 AM   Modules accepted: Orders, SmartSet

## 2016-03-18 NOTE — Assessment & Plan Note (Signed)
Deteriorated. Discussed sedation and addiction potential associated with Benzos. Pt is aware. Klonopin rx changed to twice daily as needed. Call or return to clinic prn if these symptoms worsen or fail to improve as anticipated.

## 2016-03-18 NOTE — Assessment & Plan Note (Signed)
Still having some break through symptoms although better controlled with addition of protonix. Check H Pylori today. The patient indicates understanding of these issues and agrees with the plan.

## 2016-03-18 NOTE — Assessment & Plan Note (Signed)
Continue current dose of Toprol

## 2016-03-18 NOTE — Assessment & Plan Note (Signed)
Reviewed preventive care protocols, scheduled due services, and updated immunizations Discussed nutrition, exercise, diet, and healthy lifestyle.  

## 2016-03-18 NOTE — Progress Notes (Signed)
Subjective:   Patient ID: Jill Leonard, female    DOB: November 19, 1970, 45 y.o.   MRN: [id number]  Jill Leonard is a pleasant 45 y.o. year old female who presents to clinic today with Annual Exam and Foot Pain and follow up of chronic medical conditions on 03/18/2016  HPI: H/o sjorgren's. [id number]- 45 year old son. Mammogram 11/15/15 Pap 02/08/14- done by me. Not sexually active with her husband.  GERD- taking Raninditine, Protonix daily along with probiotic.    SVT- followed by Dr. Fletcher Anon.  Notes reviewed.  Toprol has helped- no longer feeling like her heart is racing.  Pulse a little elevated today but she didn't take her Toprol to prepare for stress test scheduled for tomorrow.    Sjorgrens- symptoms have been stable.  Anxiety- does take rare Klonipin at bedtime as needed. Asking if she can take it during the day occasionally as she has had some recent panic attacks. Increased life stressors.  Was on SSRIs for years and does not want to restart daily rx. Does not feel depressed.   No SI or HI. Lab Results  Component Value Date   WBC 3.8* 03/05/2015   HGB 14.8 03/05/2015   HCT 44.1 03/05/2015   MCV 84.3 03/05/2015   PLT 263.0 03/05/2015   Lab Results  Component Value Date   NA 138 03/05/2015   K 4.0 03/05/2015   CL 104 03/05/2015   CO2 29 03/05/2015   Lab Results  Component Value Date   ALT 20 03/05/2015   AST 27 03/05/2015   ALKPHOS 47 03/05/2015   BILITOT 0.4 03/05/2015   Lab Results  Component Value Date   CHOL 202* 03/05/2015   HDL 44.20 03/05/2015   LDLCALC 137* 03/05/2015   TRIG 103.0 03/05/2015   CHOLHDL 5 03/05/2015   Lab Results  Component Value Date   TSH 2.24 03/05/2015    Patient Active Problem List   Diagnosis Date Noted  . Paroxysmal supraventricular tachycardia ([place]) 03/05/2015  . Encounter for routine gynecological examination 02/08/2014  . Routine general medical examination at a health care facility 02/08/2014  . NEPHROLITHIASIS, HX OF  03/04/2010  . TMJ PAIN 07/06/2008  . CHOLELITHIASIS 07/04/2008  . GERD 06/21/2008  . [place] SYNDROME 06/21/2008  . [place] ELEVATION OF LEVELS OF TRANSAMINASE/LDH 06/21/2008   Past Medical History  Diagnosis Date  . Anal fissure   . Depression   . Fibromyalgia   . GERD (gastroesophageal reflux disease)   . HLD (hyperlipidemia)   . History of nephrolithiasis   . Sjogren's syndrome ([place])   . Calculus of gallbladder without mention of cholecystitis or obstruction   . Arthralgia of temporomandibular joint   . Sjogren - Larsson's syndrome   . Kidney stones   . Heart murmur    Past Surgical History  Procedure Laterality Date  . Cholecystectomy  08/20/08  . Wisdom tooth extraction     Social History  Substance Use Topics  . Smoking status: Never Smoker   . Smokeless tobacco: Never Used  . Alcohol Use: 0.0 oz/week    0 Standard drinks or equivalent per week     Comment: Socially-rare   Family History  Problem Relation Age of Onset  . Bone cancer      Grandmother  . Colon polyps Father   . Diabetes      Grandmother  . Heart disease      Grandmother   Allergies  Allergen Reactions  . Albuterol     REACTION:  intolerant- "jittery" after use  . Sulfonamide Derivatives     REACTION: intolerant   Current Outpatient Prescriptions on File Prior to Visit  Medication Sig Dispense Refill  . acetaminophen (TYLENOL) 500 MG tablet Take 1,000 mg by mouth every 8 (eight) hours as needed (pain).    Marland Kitchen aspirin-acetaminophen-caffeine (EXCEDRIN MIGRAINE) 250-250-65 MG per tablet Take 2 tablets by mouth every 8 (eight) hours as needed for headache.    . cholecalciferol (VITAMIN D) 1000 UNITS tablet Take 1,000 Units by mouth daily.      . fluticasone (FLONASE) 50 MCG/ACT nasal spray Place 2 sprays into both nostrils daily as needed for allergies or rhinitis.    . metoprolol succinate (TOPROL-XL) 25 MG 24 hr tablet Take 0.5 tablets (12.5 mg total) by mouth daily. 45 tablet 3  .  pantoprazole (PROTONIX) 40 MG tablet TAKE 1 TABLET (40 MG TOTAL) BY MOUTH DAILY. 90 tablet 1  . Probiotic Product (PROBIOTIC DAILY PO) Take 1 capsule by mouth daily.    . ranitidine (ZANTAC) 150 MG tablet Take 150 mg by mouth 2 (two) times daily as needed for heartburn.      No current facility-administered medications on file prior to visit.   The PMH, PSH, Social History, Family History, Medications, and allergies have been reviewed in [facility], and have been updated if relevant.   Review of Systems  Constitutional: Negative.   HENT: Negative.   Eyes: Negative.   Respiratory: Negative.   Cardiovascular: Negative.   Gastrointestinal: Negative for nausea, vomiting, abdominal pain, diarrhea, constipation, blood in stool, abdominal distention, anal bleeding and rectal pain.  Endocrine: Negative.   Genitourinary: Negative.   Musculoskeletal: Negative.   Skin: Negative.   Allergic/Immunologic: Negative.   Neurological: Negative.   Hematological: Negative.   Psychiatric/Behavioral: Negative for suicidal ideas, hallucinations, behavioral problems, confusion, sleep disturbance, self-injury, dysphoric mood, decreased concentration and agitation. The patient is nervous/anxious. The patient is not hyperactive.   All other systems reviewed and are negative.       Objective:    BP 108/66 mmHg  Pulse 82  Temp(Src) 97.9 F (36.6 C) (Oral)  Ht '5\' 7"'$  (1.702 m)  Wt 154 lb 12 oz (70.194 kg)  BMI 24.23 kg/m2  SpO2 98%  LMP 03/10/2016  Wt Readings from Last 3 Encounters:  03/18/16 154 lb 12 oz (70.194 kg)  08/27/15 163 lb 12 oz (74.277 kg)  08/13/15 163 lb 12.8 oz (74.299 kg)     Physical Exam    General:  Well-developed,well-nourished,in no acute distress; alert,appropriate and cooperative throughout examination Head:  normocephalic and atraumatic.   Eyes:  vision grossly intact, pupils equal, pupils round, and pupils reactive to light.   Ears:  R ear normal and L ear normal.   Nose:   no external deformity.   Mouth:  good dentition.   Neck:  No deformities, masses, or tenderness noted. Breasts:  No mass, nodules, thickening, tenderness, bulging, retraction, inflamation, nipple discharge or skin changes noted.   Lungs:  Normal respiratory effort, chest expands symmetrically. Lungs are clear to auscultation, no crackles or wheezes. Heart:  Normal rate and regular rhythm. S1 and S2 normal without gallop, murmur, click, rub or other extra sounds. Abdomen:  Bowel sounds positive,abdomen soft and non-tender without masses, organomegaly or hernias noted. Rectal:  no external abnormalities.   Genitalia:  Pelvic Exam:        External: normal female genitalia without lesions or masses        Vagina: normal without lesions or  masses        Cervix: normal without lesions or masses        Adnexa: normal bimanual exam without masses or fullness        Uterus: normal by palpation        Pap smear: performed Msk:  No deformity or scoliosis noted of thoracic or lumbar spine.   Extremities:  No clubbing, cyanosis, edema, or deformity noted with normal full range of motion of all joints.   Neurologic:  alert & oriented X3 and gait normal.   Skin:  Intact without suspicious lesions or rashes Cervical Nodes:  No lymphadenopathy noted Axillary Nodes:  No palpable lymphadenopathy Psych:  Cognition and judgment appear intact. Alert and cooperative with normal attention span and concentration. No apparent delusions, illusions, hallucinations    Assessment & Plan:   Routine general medical examination at a health care facility - Plan: CBC with Differential/Platelet, Comprehensive metabolic panel, Lipid panel, TSH  Gastroesophageal reflux disease, esophagitis presence not specified - Plan: H. pylori antibody, IgG  DYSPNEA  SJOGREN'S SYNDROME  Encounter for routine gynecological examination No Follow-up on file.

## 2016-03-18 NOTE — Progress Notes (Signed)
Pre visit review using our clinic review tool, if applicable. No additional management support is needed unless otherwise documented below in the visit note. 

## 2016-03-18 NOTE — Addendum Note (Signed)
Addended by: Daralene Milch C on: 03/18/2016 01:51 PM   Modules accepted: Miquel Dunn

## 2016-03-18 NOTE — Assessment & Plan Note (Signed)
Pap today 

## 2016-03-19 LAB — CYTOLOGY - PAP

## 2016-03-20 ENCOUNTER — Encounter: Payer: Self-pay | Admitting: *Deleted

## 2016-03-31 ENCOUNTER — Other Ambulatory Visit: Payer: Self-pay | Admitting: Family Medicine

## 2016-05-04 DIAGNOSIS — M797 Fibromyalgia: Secondary | ICD-10-CM | POA: Diagnosis not present

## 2016-05-04 DIAGNOSIS — M3500 Sicca syndrome, unspecified: Secondary | ICD-10-CM | POA: Diagnosis not present

## 2016-06-06 ENCOUNTER — Other Ambulatory Visit: Payer: Self-pay | Admitting: Family Medicine

## 2016-06-09 ENCOUNTER — Ambulatory Visit (INDEPENDENT_AMBULATORY_CARE_PROVIDER_SITE_OTHER): Payer: BLUE CROSS/BLUE SHIELD | Admitting: Cardiovascular Disease

## 2016-06-09 VITALS — BP 106/74 | HR 85 | Ht 67.0 in | Wt 150.0 lb

## 2016-06-09 DIAGNOSIS — R002 Palpitations: Secondary | ICD-10-CM | POA: Diagnosis not present

## 2016-06-09 NOTE — Progress Notes (Signed)
Cardiology Office Note   Date:  06/09/2016   ID:  Jill Leonard, DOB [birth year], MRN [id number]  PCP:  Arnette Norris, MD  Cardiologist:   Kathlyn Sacramento, MD   Chief Complaint  Patient presents with  . Follow-up    patient repotw no new problems.      History of Present Illness: Jill Leonard is a 45 y.o. female who presents for a follow-up visit regarding palpitations and tachycardia. She has known history of Sjogren's syndrome.  Echocardiogram in January 2016 showed normal LV systolic function with an ejection fraction of 60% with borderline prolapse of anterior mitral valve leaflet with only mild mitral regurgitation and no evidence of pulmonary hypertension. A Holter monitor showed normal sinus rhythm with occasional PVCs and PACs. She had a total of 445 PVCs and 1200 PACs. A treadmill stress test in June of 2016 was normal. she was started on small dose Toprol with improvement in palpitations. She also cut down on caffeine intake.  Toprol was stopped in March. However, she had recurrent palpitations and went back on the medication. She feels better overall and she has been exercising regularly. No chest pain or shortness of breath.    Past Medical History:  Diagnosis Date  . Anal fissure   . Arthralgia of temporomandibular joint   . Calculus of gallbladder without mention of cholecystitis or obstruction   . Depression   . Fibromyalgia   . GERD (gastroesophageal reflux disease)   . Heart murmur   . History of nephrolithiasis   . HLD (hyperlipidemia)   . Kidney stones   . Sjogren - Larsson's syndrome   . Sjogren's syndrome [facility])     Past Surgical History:  Procedure Laterality Date  . CHOLECYSTECTOMY  08/20/08  . WISDOM TOOTH EXTRACTION       Current Outpatient Prescriptions  Medication Sig Dispense Refill  . acetaminophen (TYLENOL) 500 MG tablet Take 1,000 mg by mouth every 8 (eight) hours as needed (pain).    Marland Kitchen aspirin-acetaminophen-caffeine (EXCEDRIN  MIGRAINE) 250-250-65 MG per tablet Take 2 tablets by mouth every 8 (eight) hours as needed for headache.    . cholecalciferol (VITAMIN D) 1000 UNITS tablet Take 1,000 Units by mouth daily.      . clonazePAM (KLONOPIN) 1 MG tablet Take 1 tablet (1 mg total) by mouth 2 (two) times daily as needed for anxiety. 60 tablet 0  . fluticasone (FLONASE) 50 MCG/ACT nasal spray Place 2 sprays into both nostrils daily as needed for allergies or rhinitis.    . metoprolol succinate (TOPROL-XL) 25 MG 24 hr tablet Take 0.5 tablets (12.5 mg total) by mouth daily. 45 tablet 3  . pantoprazole (PROTONIX) 40 MG tablet TAKE 1 TABLET DAILY 90 tablet 0  . Probiotic Product (PROBIOTIC DAILY PO) Take 1 capsule by mouth daily.    . ranitidine (ZANTAC) 150 MG tablet Take 150 mg by mouth 2 (two) times daily as needed for heartburn.      No current facility-administered medications for this visit.     Allergies:   Albuterol and Sulfonamide derivatives    Social History:  The patient  reports that she has never smoked. She has never used smokeless tobacco. She reports that she drinks alcohol. She reports that she does not use drugs.   Family History:  The patient's family history includes Colon polyps in her father.    ROS:  Please see the history of present illness.   Otherwise, review of systems are positive for  none.   All other systems are reviewed and negative.    PHYSICAL EXAM: VS:  BP 106/74   Pulse 85   Ht '5\' 7"'$  (1.702 m)   Wt 150 lb (68 kg)   BMI 23.49 kg/m  , BMI Body mass index is 23.49 kg/m. GEN: Well nourished, well developed, in no acute distress  HEENT: normal  Neck: no JVD, carotid bruits, or masses Cardiac: RRR; no murmurs, rubs, or gallops,no edema  Respiratory:  clear to auscultation bilaterally, normal work of breathing GI: soft, nontender, nondistended, + BS MS: no deformity or atrophy  Skin: warm and dry, no rash Neuro:  Strength and sensation are intact Psych: euthymic mood, full  affect   EKG:  EKG is ordered today. The ekg ordered today demonstrates normal sinus rhythm with no significant ST or T wave changes. Suspected Lead reversal.   Recent Labs: 03/18/2016: ALT 13; BUN 10; Creatinine, Ser 0.79; Hemoglobin 14.9; Platelets 283.0; Potassium 3.8; Sodium 138; TSH 1.80    Lipid Panel    Component Value Date/Time   CHOL 196 03/18/2016 0907   TRIG 76.0 03/18/2016 0907   HDL 46.60 03/18/2016 0907   CHOLHDL 4 03/18/2016 0907   VLDL 15.2 03/18/2016 0907   LDLCALC 134 (H) 03/18/2016 0907      Wt Readings from Last 3 Encounters:  06/09/16 150 lb (68 kg)  03/18/16 154 lb 12 oz (70.2 kg)  08/27/15 163 lb 12 oz (74.3 kg)       No flowsheet data found.    ASSESSMENT AND PLAN:  1.  Palpitations due to PVCs and PACs: Overall well controlled with small dose of Toprol.  2.  Borderline mitral valve prolapse with mild mitral regurgitation. I don't hear any heart murmurs by physical exam. Plan repeat echocardiogram in 2019.    Disposition:   FU with me in 1 year  Signed,  Kathlyn Sacramento, MD  06/09/2016 8:22 AM    [place]

## 2016-06-09 NOTE — Patient Instructions (Signed)

## 2016-07-13 ENCOUNTER — Other Ambulatory Visit: Payer: Self-pay | Admitting: Family Medicine

## 2016-07-15 NOTE — Telephone Encounter (Signed)
Rx called in to requested pharmacy 

## 2016-07-15 NOTE — Telephone Encounter (Signed)
Last f/u 03/2016-CPE

## 2016-08-15 ENCOUNTER — Other Ambulatory Visit: Payer: Self-pay | Admitting: Cardiovascular Disease

## 2016-08-15 DIAGNOSIS — R002 Palpitations: Secondary | ICD-10-CM

## 2016-09-06 ENCOUNTER — Other Ambulatory Visit: Payer: Self-pay | Admitting: Family Medicine

## 2016-10-15 ENCOUNTER — Other Ambulatory Visit: Payer: Self-pay

## 2016-10-15 NOTE — Telephone Encounter (Signed)
Pt left v/m requesting refill clonazepam to [facility] [place]. Last seen 03/18/2016 annual exam . Last refilled # 60 on 07/15/16.

## 2016-10-16 MED ORDER — CLONAZEPAM 1 MG PO TABS: ORAL_TABLET | ORAL | 0 refills | Status: DC

## 2016-10-16 NOTE — Telephone Encounter (Signed)
Rx called in to requested pharmacy 

## 2016-12-17 ENCOUNTER — Other Ambulatory Visit: Payer: Self-pay | Admitting: Family Medicine

## 2016-12-17 NOTE — Telephone Encounter (Signed)
Rx for Clonazepam faxed to pharmacy. Patient also notified

## 2016-12-24 NOTE — Telephone Encounter (Signed)
Pt said clonazepam was not at [facility] [place]; Medication phoned to Vladimir Creeks at [facility] [place] [facility] as instructed. Pt appreciative and voiced understanding.

## 2016-12-28 DIAGNOSIS — D1800 Hemangioma unspecified site: Secondary | ICD-10-CM | POA: Diagnosis not present

## 2016-12-28 DIAGNOSIS — L821 Other seborrheic keratosis: Secondary | ICD-10-CM | POA: Diagnosis not present

## 2016-12-28 DIAGNOSIS — L820 Inflamed seborrheic keratosis: Secondary | ICD-10-CM | POA: Diagnosis not present

## 2016-12-28 DIAGNOSIS — D229 Melanocytic nevi, unspecified: Secondary | ICD-10-CM | POA: Diagnosis not present

## 2017-02-03 DIAGNOSIS — Z1231 Encounter for screening mammogram for malignant neoplasm of breast: Secondary | ICD-10-CM | POA: Diagnosis not present

## 2017-02-12 ENCOUNTER — Ambulatory Visit (INDEPENDENT_AMBULATORY_CARE_PROVIDER_SITE_OTHER): Payer: BLUE CROSS/BLUE SHIELD | Admitting: Internal Medicine

## 2017-02-12 ENCOUNTER — Encounter: Payer: Self-pay | Admitting: Internal Medicine

## 2017-02-12 VITALS — BP 114/70 | HR 102 | Temp 98.2°F | Resp 12 | Wt 156.0 lb

## 2017-02-12 DIAGNOSIS — J0141 Acute recurrent pansinusitis: Secondary | ICD-10-CM | POA: Diagnosis not present

## 2017-02-12 MED ORDER — AMOXICILLIN-POT CLAVULANATE 875-125 MG PO TABS: 1.0000 | ORAL_TABLET | Freq: Two times a day (BID) | ORAL | 0 refills | Status: AC

## 2017-02-12 NOTE — Assessment & Plan Note (Signed)
Recurrent infections Started with apparent viral infection but now with clear sinus symptoms Discussed the flonase and tylenol augmentin

## 2017-02-12 NOTE — Progress Notes (Signed)
Subjective:    Patient ID: Jill Leonard, female    DOB: [birth year], 46 y.o.   MRN: [id number]  HPI Here due to respiratory infection  Started with bad sore throat 5 days ago Improved but next day had fever--and yesterday---high 101.3 Off and on with sore throat Bad nasal congestion Cough in Am--some sputum  Easy DOE from her Sjogren's--may be some worse Some chills last night--and hot Some sweats Ears feel full of fluid--but not painful Maxillary and frontal headache today  Using tylenol and lots of fluids Tried flonase this morning  Current Outpatient Prescriptions on File Prior to Visit  Medication Sig Dispense Refill  . acetaminophen (TYLENOL) 500 MG tablet Take 1,000 mg by mouth every 8 (eight) hours as needed (pain).    Marland Kitchen aspirin-acetaminophen-caffeine (EXCEDRIN MIGRAINE) 250-250-65 MG per tablet Take 2 tablets by mouth every 8 (eight) hours as needed for headache.    . cholecalciferol (VITAMIN D) 1000 UNITS tablet Take 1,000 Units by mouth daily.      . clonazePAM (KLONOPIN) 1 MG tablet TAKE 1 TABLET BY MOUTH TWICE DAILY AS NEEDED FOR ANXIETY 60 tablet 0  . fluticasone (FLONASE) 50 MCG/ACT nasal spray Place 2 sprays into both nostrils daily as needed for allergies or rhinitis.    . metoprolol succinate (TOPROL-XL) 25 MG 24 hr tablet TAKE ONE-HALF (1/2) TABLET DAILY 45 tablet 3  . pantoprazole (PROTONIX) 40 MG tablet TAKE 1 TABLET DAILY 90 tablet 2  . Probiotic Product (PROBIOTIC DAILY PO) Take 1 capsule by mouth daily.    . ranitidine (ZANTAC) 150 MG tablet Take 150 mg by mouth 2 (two) times daily as needed for heartburn.      No current facility-administered medications on file prior to visit.     Allergies  Allergen Reactions  . Albuterol     REACTION: intolerant- "jittery" after use  . Sulfonamide Derivatives     REACTION: intolerant    Past Medical History:  Diagnosis Date  . Anal fissure   . Arthralgia of temporomandibular joint   . Calculus of  gallbladder without mention of cholecystitis or obstruction   . Depression   . Fibromyalgia   . GERD (gastroesophageal reflux disease)   . Heart murmur   . History of nephrolithiasis   . HLD (hyperlipidemia)   . Kidney stones   . Sjogren - Larsson's syndrome   . Sjogren's syndrome [facility])     Past Surgical History:  Procedure Laterality Date  . CHOLECYSTECTOMY  08/20/08  . WISDOM TOOTH EXTRACTION      Family History  Problem Relation Age of Onset  . Bone cancer Unknown        Grandmother  . Colon polyps Father   . Diabetes Unknown        Grandmother  . Heart disease Unknown        Grandmother    Social History   Social History  . Marital status: Married    Spouse name: N/A  . Number of children: N/A  . Years of education: N/A   Occupational History  . Not on file.   Social History Main Topics  . Smoking status: Never Smoker  . Smokeless tobacco: Never Used  . Alcohol use 0.0 oz/week     Comment: Socially-rare  . Drug use: No  . Sexual activity: Not on file   Other Topics Concern  . Not on file   Social History Narrative   Married      Caffeine: 2 cups/day  No regular exercise         Review of Systems Chronic sinus infections Slight nausea yesterday and this AM. No vomiting Appetite is off some--taste is off No rash recently    Objective:   Physical Exam  Constitutional: She appears well-nourished. No distress.  HENT:  Mild frontal and maxillary tenderness TMs normal Moderate nasal inflammation Mild pharyngeal injection without exudate  Neck: No thyromegaly present.  Pulmonary/Chest: Effort normal and breath sounds normal. No respiratory distress. She has no wheezes. She has no rales.  Lymphadenopathy:    She has no cervical adenopathy.          Assessment & Plan:

## 2017-02-26 ENCOUNTER — Other Ambulatory Visit: Payer: Self-pay | Admitting: Family Medicine

## 2017-02-26 NOTE — Telephone Encounter (Signed)
Last refill last refill 12/17/16 #60  Last had an acute visit with Dr. Silvio Pate on 02/12/17 Ok to refill?

## 2017-02-26 NOTE — Telephone Encounter (Signed)
[id number] [place] [place] [facility] [address] AT [place]: [phone]

## 2017-03-30 ENCOUNTER — Encounter: Payer: BLUE CROSS/BLUE SHIELD | Admitting: Family Medicine

## 2017-04-06 ENCOUNTER — Ambulatory Visit (INDEPENDENT_AMBULATORY_CARE_PROVIDER_SITE_OTHER): Payer: BLUE CROSS/BLUE SHIELD | Admitting: Family Medicine

## 2017-04-06 ENCOUNTER — Other Ambulatory Visit (HOSPITAL_COMMUNITY)
Admission: RE | Admit: 2017-04-06 | Discharge: 2017-04-06 | Disposition: A | Discharge status: Home / Self Care | Payer: BLUE CROSS/BLUE SHIELD | Source: Ambulatory Visit | Attending: Family Medicine | Admitting: Family Medicine

## 2017-04-06 ENCOUNTER — Encounter: Payer: Self-pay | Admitting: Family Medicine

## 2017-04-06 VITALS — BP 100/80 | HR 88 | Ht 67.0 in | Wt 157.0 lb

## 2017-04-06 DIAGNOSIS — K219 Gastro-esophageal reflux disease without esophagitis: Secondary | ICD-10-CM

## 2017-04-06 DIAGNOSIS — I471 Supraventricular tachycardia, unspecified: Secondary | ICD-10-CM

## 2017-04-06 DIAGNOSIS — N951 Menopausal and female climacteric states: Secondary | ICD-10-CM

## 2017-04-06 DIAGNOSIS — Z0000 Encounter for general adult medical examination without abnormal findings: Secondary | ICD-10-CM | POA: Diagnosis not present

## 2017-04-06 DIAGNOSIS — Z124 Encounter for screening for malignant neoplasm of cervix: Secondary | ICD-10-CM | POA: Insufficient documentation

## 2017-04-06 DIAGNOSIS — F411 Generalized anxiety disorder: Secondary | ICD-10-CM

## 2017-04-06 DIAGNOSIS — M3500 Sicca syndrome, unspecified: Secondary | ICD-10-CM | POA: Diagnosis not present

## 2017-04-06 DIAGNOSIS — Z01419 Encounter for gynecological examination (general) (routine) without abnormal findings: Secondary | ICD-10-CM | POA: Diagnosis not present

## 2017-04-06 LAB — LIPID PANEL
Cholesterol: 208 mg/dL — ABNORMAL HIGH (ref 0–200)
HDL: 45.9 mg/dL (ref >39.00)
LDL Cholesterol: 138 mg/dL — ABNORMAL HIGH (ref 0–99)
NONHDL: 161.95
Total CHOL/HDL Ratio: 5
Triglycerides: 122 mg/dL (ref 0.0–149.0)
VLDL: 24.4 mg/dL (ref 0.0–40.0)

## 2017-04-06 LAB — CBC WITH DIFFERENTIAL/PLATELET
BASOS PCT: 0.6 % (ref 0.0–3.0)
Basophils Absolute: 0 10*3/uL (ref 0.0–0.1)
EOS PCT: 3.3 % (ref 0.0–5.0)
Eosinophils Absolute: 0.2 10*3/uL (ref 0.0–0.7)
HCT: 43.7 % (ref 36.0–46.0)
HEMOGLOBIN: 14.6 g/dL (ref 12.0–15.0)
LYMPHS ABS: 1.7 10*3/uL (ref 0.7–4.0)
Lymphocytes Relative: 35.6 % (ref 12.0–46.0)
MCHC: 33.5 g/dL (ref 30.0–36.0)
MCV: 85.2 fl (ref 78.0–100.0)
MONO ABS: 0.5 10*3/uL (ref 0.1–1.0)
Monocytes Relative: 10.7 % (ref 3.0–12.0)
NEUTROS ABS: 2.4 10*3/uL (ref 1.4–7.7)
Neutrophils Relative %: 49.8 % (ref 43.0–77.0)
PLATELETS: 311 10*3/uL (ref 150.0–400.0)
RBC: 5.13 Mil/uL — ABNORMAL HIGH (ref 3.87–5.11)
RDW: 14.1 % (ref 11.5–15.5)
WBC: 4.9 10*3/uL (ref 4.0–10.5)

## 2017-04-06 LAB — COMPREHENSIVE METABOLIC PANEL WITH GFR
ALT: 22 U/L (ref 0–35)
AST: 30 U/L (ref 0–37)
Albumin: 4.2 g/dL (ref 3.5–5.2)
Alkaline Phosphatase: 59 U/L (ref 39–117)
BUN: 8 mg/dL (ref 6–23)
CO2: 31 meq/L (ref 19–32)
Calcium: 9.7 mg/dL (ref 8.4–10.5)
Chloride: 103 meq/L (ref 96–112)
Creatinine, Ser: 0.78 mg/dL (ref 0.40–1.20)
GFR: 84.58 mL/min
Glucose, Bld: 85 mg/dL (ref 70–99)
Potassium: 3.9 meq/L (ref 3.5–5.1)
Sodium: 138 meq/L (ref 135–145)
Total Bilirubin: 0.5 mg/dL (ref 0.2–1.2)
Total Protein: 8.3 g/dL (ref 6.0–8.3)

## 2017-04-06 LAB — TSH: TSH: 1.78 u[IU]/mL (ref 0.35–4.50)

## 2017-04-06 LAB — LUTEINIZING HORMONE: LH: 11.25 m[IU]/mL

## 2017-04-06 LAB — FOLLICLE STIMULATING HORMONE: FSH: 13.4 m[IU]/mL

## 2017-04-06 NOTE — Addendum Note (Signed)
Addended by: Mady Haagensen on: 04/06/2017 08:42 AM   Modules accepted: Orders

## 2017-04-06 NOTE — Assessment & Plan Note (Signed)
Discussed USPSTF recommendations of cervical cancer screening.  She is aware that interval of 3 years is recommended but pt would prefer to have pap smear done today.  

## 2017-04-06 NOTE — Assessment & Plan Note (Signed)
Continue current rx.  No changes made.

## 2017-04-06 NOTE — Assessment & Plan Note (Signed)
Reviewed preventive care protocols, scheduled due services, and updated immunizations Discussed nutrition, exercise, diet, and healthy lifestyle.  

## 2017-04-06 NOTE — Assessment & Plan Note (Signed)
Improved with Toprol.

## 2017-04-06 NOTE — Assessment & Plan Note (Signed)
Continue as needed benzo.

## 2017-04-06 NOTE — Progress Notes (Signed)
Subjective:   Patient ID: Jill Leonard, female    DOB: 08-Feb-1971, 46 y.o.   MRN: [id number]  Jill Leonard is a pleasant 46 y.o. year old female who presents to clinic today with Annual Exam and follow up of chronic medical conditions on 04/06/2017  HPI: H/o sjorgren's. [id number]- 11 year old son. Mammogram 02/2017 Pap 03/28/16- done by me.  Not sexually active with her husband.  Perimenopause- abnormal periods for past year and a half, having hot flashes.  GERD- taking Raninditine, Protonix daily along with probiotic.    SVT- followed by Dr. Fletcher Anon.  Notes reviewed.  Was last seen on 06/09/16. Toprol has helped- no longer feeling like her heart is racing. He did advise repeat echo in 2019 to re evaluate mild MR.  Sjorgrens- symptoms have been stable.  Anxiety- does take rare Klonipin at bedtime as needed. Was on SSRIs for years and does not want to restart daily rx. Does not feel depressed.   No SI or HI. Lab Results  Component Value Date   WBC 5.1 03/18/2016   HGB 14.9 03/18/2016   HCT 43.9 03/18/2016   MCV 82.5 03/18/2016   PLT 283.0 03/18/2016   Lab Results  Component Value Date   NA 138 03/18/2016   K 3.8 03/18/2016   CL 105 03/18/2016   CO2 30 03/18/2016   Lab Results  Component Value Date   ALT 13 03/18/2016   AST 24 03/18/2016   ALKPHOS 50 03/18/2016   BILITOT 0.3 03/18/2016   Lab Results  Component Value Date   CHOL 196 03/18/2016   HDL 46.60 03/18/2016   LDLCALC 134 (H) 03/18/2016   TRIG 76.0 03/18/2016   CHOLHDL 4 03/18/2016   Lab Results  Component Value Date   TSH 1.80 03/18/2016    Patient Active Problem List   Diagnosis Date Noted  . Generalized anxiety disorder 03/18/2016  . Paroxysmal supraventricular tachycardia ([place]) 03/05/2015  . Encounter for routine gynecological examination 02/08/2014  . Routine general medical examination at a health care facility 02/08/2014  . NEPHROLITHIASIS, HX OF 03/04/2010  . TMJ PAIN 07/06/2008  .  CHOLELITHIASIS 07/04/2008  . GERD 06/21/2008  . [place] SYNDROME 06/21/2008  . NONSPEC ELEVATION OF LEVELS OF TRANSAMINASE/LDH 06/21/2008   Past Medical History:  Diagnosis Date  . Anal fissure   . Arthralgia of temporomandibular joint   . Calculus of gallbladder without mention of cholecystitis or obstruction   . Depression   . Fibromyalgia   . GERD (gastroesophageal reflux disease)   . Heart murmur   . History of nephrolithiasis   . HLD (hyperlipidemia)   . Kidney stones   . Sjogren - Larsson's syndrome   . Sjogren's syndrome [facility])    Past Surgical History:  Procedure Laterality Date  . CHOLECYSTECTOMY  08/20/08  . WISDOM TOOTH EXTRACTION     Social History  Substance Use Topics  . Smoking status: Never Smoker  . Smokeless tobacco: Never Used  . Alcohol use 0.0 oz/week     Comment: Socially-rare   Family History  Problem Relation Age of Onset  . Bone cancer Unknown        Grandmother  . Colon polyps Father   . Diabetes Unknown        Grandmother  . Heart disease Unknown        Grandmother   Allergies  Allergen Reactions  . Albuterol     REACTION: intolerant- "jittery" after use  . Sulfonamide Derivatives  REACTION: intolerant   Current Outpatient Prescriptions on File Prior to Visit  Medication Sig Dispense Refill  . acetaminophen (TYLENOL) 500 MG tablet Take 1,000 mg by mouth every 8 (eight) hours as needed (pain).    Marland Kitchen aspirin-acetaminophen-caffeine (EXCEDRIN MIGRAINE) 250-250-65 MG per tablet Take 2 tablets by mouth every 8 (eight) hours as needed for headache.    . cholecalciferol (VITAMIN D) 1000 UNITS tablet Take 1,000 Units by mouth daily.      . clonazePAM (KLONOPIN) 1 MG tablet TAKE 1 TABLET BY MOUTH TWICE DAILY AS NEEDED FOR ANXIETY 60 tablet 0  . fluticasone (FLONASE) 50 MCG/ACT nasal spray Place 2 sprays into both nostrils daily as needed for allergies or rhinitis.    . metoprolol succinate (TOPROL-XL) 25 MG 24 hr tablet TAKE ONE-HALF (1/2)  TABLET DAILY 45 tablet 3  . pantoprazole (PROTONIX) 40 MG tablet TAKE 1 TABLET DAILY 90 tablet 2  . Probiotic Product (PROBIOTIC DAILY PO) Take 1 capsule by mouth daily.    . ranitidine (ZANTAC) 150 MG tablet Take 150 mg by mouth 2 (two) times daily as needed for heartburn.      No current facility-administered medications on file prior to visit.    The PMH, PSH, Social History, Family History, Medications, and allergies have been reviewed in [facility], and have been updated if relevant.   Review of Systems  Constitutional: Negative.   HENT: Negative.   Eyes: Negative.   Respiratory: Negative.   Cardiovascular: Negative.   Gastrointestinal: Negative for abdominal distention, abdominal pain, anal bleeding, blood in stool, constipation, diarrhea, nausea, rectal pain and vomiting.  Endocrine: Positive for heat intolerance.  Genitourinary: Negative.   Musculoskeletal: Negative.   Skin: Negative.   Allergic/Immunologic: Negative.   Neurological: Negative.   Hematological: Negative.   Psychiatric/Behavioral: Negative for agitation, behavioral problems, confusion, decreased concentration, dysphoric mood, hallucinations, self-injury, sleep disturbance and suicidal ideas. The patient is nervous/anxious. The patient is not hyperactive.   All other systems reviewed and are negative.       Objective:    BP 100/80   Pulse 88   Ht 5\' 7"  (1.702 m)   Wt 157 lb (71.2 kg)   LMP 04/03/2017   SpO2 98%   BMI 24.59 kg/m   Wt Readings from Last 3 Encounters:  04/06/17 157 lb (71.2 kg)  02/12/17 156 lb (70.8 kg)  06/09/16 150 lb (68 kg)     Physical Exam    General:  Well-developed,well-nourished,in no acute distress; alert,appropriate and cooperative throughout examination Head:  normocephalic and atraumatic.   Eyes:  vision grossly intact, pupils equal, pupils round, and pupils reactive to light.   Ears:  R ear normal and L ear normal.   Nose:  no external deformity.   Mouth:  good  dentition.   Neck:  No deformities, masses, or tenderness noted. Breasts:  No mass, nodules, thickening, tenderness, bulging, retraction, inflamation, nipple discharge or skin changes noted.   Lungs:  Normal respiratory effort, chest expands symmetrically. Lungs are clear to auscultation, no crackles or wheezes. Heart:  Normal rate and regular rhythm. S1 and S2 normal without gallop, murmur, click, rub or other extra sounds. Abdomen:  Bowel sounds positive,abdomen soft and non-tender without masses, organomegaly or hernias noted. Rectal:  no external abnormalities.   Genitalia:  Pelvic Exam:        External: normal female genitalia without lesions or masses        Vagina: normal without lesions or masses  Cervix: normal without lesions or masses        Adnexa: normal bimanual exam without masses or fullness        Uterus: normal by palpation        Pap smear: performed Msk:  No deformity or scoliosis noted of thoracic or lumbar spine.   Extremities:  No clubbing, cyanosis, edema, or deformity noted with normal full range of motion of all joints.   Neurologic:  alert & oriented X3 and gait normal.   Skin:  Intact without suspicious lesions or rashes Cervical Nodes:  No lymphadenopathy noted Axillary Nodes:  No palpable lymphadenopathy Psych:  Cognition and judgment appear intact. Alert and cooperative with normal attention span and concentration. No apparent delusions, illusions, hallucinations    Assessment & Plan:   Routine general medical examination at a health care facility  [place] No Follow-up on file.

## 2017-04-06 NOTE — Assessment & Plan Note (Signed)
Pt would like LH and [place] checked today.

## 2017-04-06 NOTE — Patient Instructions (Signed)
Great to see you.  We will cal you with your lab results or you can view them online.

## 2017-04-07 LAB — HIV ANTIBODY (ROUTINE TESTING W REFLEX): HIV: NONREACTIVE

## 2017-04-08 LAB — CYTOLOGY - PAP
DIAGNOSIS: NEGATIVE
HPV: NOT DETECTED

## 2017-04-29 ENCOUNTER — Other Ambulatory Visit: Payer: Self-pay | Admitting: Family Medicine

## 2017-04-29 NOTE — Telephone Encounter (Signed)
Phoned In

## 2017-06-03 ENCOUNTER — Other Ambulatory Visit: Payer: Self-pay | Admitting: Family Medicine

## 2017-06-14 DIAGNOSIS — H04123 Dry eye syndrome of bilateral lacrimal glands: Secondary | ICD-10-CM | POA: Diagnosis not present

## 2017-06-14 DIAGNOSIS — H5213 Myopia, bilateral: Secondary | ICD-10-CM | POA: Diagnosis not present

## 2017-06-14 DIAGNOSIS — H52203 Unspecified astigmatism, bilateral: Secondary | ICD-10-CM | POA: Diagnosis not present

## 2017-06-14 DIAGNOSIS — H524 Presbyopia: Secondary | ICD-10-CM | POA: Diagnosis not present

## 2017-06-29 ENCOUNTER — Other Ambulatory Visit: Payer: Self-pay | Admitting: Family Medicine

## 2017-06-30 NOTE — Telephone Encounter (Signed)
Faxed to pharm/thx dmf

## 2017-06-30 NOTE — Telephone Encounter (Signed)
Requesting: Clonazepam Contract: None UDS: None Last OV: 7.24.2018 Next OV: Not scheduled Last Refill: 8.16.2018   Please advise

## 2017-07-13 ENCOUNTER — Ambulatory Visit (INDEPENDENT_AMBULATORY_CARE_PROVIDER_SITE_OTHER): Payer: BLUE CROSS/BLUE SHIELD | Admitting: Cardiovascular Disease

## 2017-07-13 VITALS — BP 98/72 | HR 88 | Ht 69.0 in | Wt 162.0 lb

## 2017-07-13 DIAGNOSIS — I059 Rheumatic mitral valve disease, unspecified: Secondary | ICD-10-CM | POA: Diagnosis not present

## 2017-07-13 DIAGNOSIS — R002 Palpitations: Secondary | ICD-10-CM | POA: Diagnosis not present

## 2017-07-13 NOTE — Patient Instructions (Signed)
Medication Instructions:  Continue current medications  If you need a refill on your cardiac medications before your next appointment, please call your pharmacy.  Labwork: None Ordered   Testing/Procedures: Your physician has requested that you have an echocardiogram in 1 year. Echocardiography is a painless test that uses sound waves to create images of your heart. It provides your doctor with information about the size and shape of your heart and how well your heart's chambers and valves are working. This procedure takes approximately one hour. There are no restrictions for this procedure.   Follow-Up: Your physician wants you to follow-up in: 1 Year. You should receive a reminder letter in the mail two months in advance. If you do not receive a letter, please call our office [phone].   Thank you for choosing [facility] at [facility]!!

## 2017-07-13 NOTE — Progress Notes (Signed)
Cardiology Office Note   Date:  07/13/2017   ID:  KASH MOTHERSHEAD, DOB 03/09/1971, MRN [id number]  PCP:  Lucille Passy, MD  Cardiologist:   Kathlyn Sacramento, MD   No chief complaint on file.     History of Present Illness: Jill Leonard is a 46 y.o. female who presents for a follow-up visit regarding palpitations and tachycardia. She has known history of Sjogren's syndrome.  Echocardiogram in January 2016 showed normal LV systolic function with an ejection fraction of 60% with borderline prolapse of anterior mitral valve leaflet with only mild mitral regurgitation and no evidence of pulmonary hypertension. A Holter monitor showed normal sinus rhythm with occasional PVCs and PACs. She had a total of 445 PVCs and 1200 PACs. A treadmill stress test in June of 2016 was normal. she was started on small dose Toprol with improvement in palpitations. She also cut down on caffeine intake.  She has been doing reasonably well with no chest pain.  She has stable palpitations.  She reports shortness of breath.  She continues to have significant stress and anxiety.  She does not sleep well at night.    Past Medical History:  Diagnosis Date  . Anal fissure   . Arthralgia of temporomandibular joint   . Calculus of gallbladder without mention of cholecystitis or obstruction   . Depression   . Fibromyalgia   . GERD (gastroesophageal reflux disease)   . Heart murmur   . History of nephrolithiasis   . HLD (hyperlipidemia)   . Kidney stones   . Sjogren - Larsson's syndrome   . Sjogren's syndrome [facility])     Past Surgical History:  Procedure Laterality Date  . CHOLECYSTECTOMY  08/20/08  . WISDOM TOOTH EXTRACTION       Current Outpatient Prescriptions  Medication Sig Dispense Refill  . acetaminophen (TYLENOL) 500 MG tablet Take 1,000 mg by mouth every 8 (eight) hours as needed (pain).    Marland Kitchen aspirin-acetaminophen-caffeine (EXCEDRIN MIGRAINE) 250-250-65 MG per tablet Take 2 tablets by  mouth every 8 (eight) hours as needed for headache.    . cholecalciferol (VITAMIN D) 1000 UNITS tablet Take 1,000 Units by mouth daily.      . clonazePAM (KLONOPIN) 1 MG tablet TAKE 1 TABLET BY MOUTH TWICE DAILY AS NEEDED FOR ANXIETY. 60 tablet 0  . fluticasone (FLONASE) 50 MCG/ACT nasal spray Place 2 sprays into both nostrils daily as needed for allergies or rhinitis.    . metoprolol succinate (TOPROL-XL) 25 MG 24 hr tablet TAKE ONE-HALF (1/2) TABLET DAILY 45 tablet 3  . pantoprazole (PROTONIX) 40 MG tablet TAKE 1 TABLET DAILY 90 tablet 2  . Probiotic Product (PROBIOTIC DAILY PO) Take 1 capsule by mouth daily.    . ranitidine (ZANTAC) 150 MG tablet Take 150 mg by mouth 2 (two) times daily as needed for heartburn.      No current facility-administered medications for this visit.     Allergies:   Albuterol; Sulfonamide derivatives; and Latex    Social History:  The patient  reports that she has never smoked. She has never used smokeless tobacco. She reports that she drinks alcohol. She reports that she does not use drugs.   Family History:  The patient's family history includes Bone cancer in her unknown relative; Colon polyps in her father; Diabetes in her unknown relative; Heart disease in her unknown relative.    ROS:  Please see the history of present illness.   Otherwise, review of systems are  positive for none.   All other systems are reviewed and negative.    PHYSICAL EXAM: VS:  BP 98/72   Pulse 88   Ht 5\' 9"  (1.753 m)   Wt 162 lb (73.5 kg)   BMI 23.92 kg/m  , BMI Body mass index is 23.92 kg/m. GEN: Well nourished, well developed, in no acute distress  HEENT: normal  Neck: no JVD, carotid bruits, or masses Cardiac: RRR; no murmurs, rubs, or gallops,no edema  Respiratory:  clear to auscultation bilaterally, normal work of breathing GI: soft, nontender, nondistended, + BS MS: no deformity or atrophy  Skin: warm and dry, no rash Neuro:  Strength and sensation are  intact Psych: euthymic mood, full affect   EKG:  EKG is ordered today. The ekg ordered today demonstrates normal sinus rhythm with no significant ST or T wave changes.  Poor R wave progression in the anterior leads.   Recent Labs: 04/06/2017: ALT 22; BUN 8; Creatinine, Ser 0.78; Hemoglobin 14.6; Platelets 311.0; Potassium 3.9; Sodium 138; TSH 1.78    Lipid Panel    Component Value Date/Time   CHOL 208 (H) 04/06/2017 0849   TRIG 122.0 04/06/2017 0849   HDL 45.90 04/06/2017 0849   CHOLHDL 5 04/06/2017 0849   VLDL 24.4 04/06/2017 0849   LDLCALC 138 (H) 04/06/2017 0849      Wt Readings from Last 3 Encounters:  07/13/17 162 lb (73.5 kg)  04/06/17 157 lb (71.2 kg)  02/12/17 156 lb (70.8 kg)       No flowsheet data found.    ASSESSMENT AND PLAN:  1.  Palpitations due to PVCs and PACs: Overall well controlled with small dose of Toprol.  2.  Borderline mitral valve prolapse with mild mitral regurgitation.  No heart murmur by physical exam.  Repeat echocardiogram in 1 year. 3.  Anxiety: Her symptoms are still not controlled.Continue to follow-up with primary care physician.   Disposition:   FU with me in 1 year  Signed,  Kathlyn Sacramento, MD  07/13/2017 8:21 AM    [place]

## 2017-08-10 ENCOUNTER — Other Ambulatory Visit: Payer: Self-pay | Admitting: Cardiovascular Disease

## 2017-08-10 DIAGNOSIS — R002 Palpitations: Secondary | ICD-10-CM

## 2017-08-10 NOTE — Telephone Encounter (Signed)
REFILL 

## 2017-08-10 NOTE — Telephone Encounter (Signed)
Refill Request.  

## 2017-08-31 ENCOUNTER — Telehealth: Payer: Self-pay | Admitting: Family Medicine

## 2017-09-01 ENCOUNTER — Telehealth: Payer: Self-pay | Admitting: Nurse Practitioner

## 2017-09-01 MED ORDER — CLONAZEPAM 1 MG PO TABS: 1.0000 mg | ORAL_TABLET | Freq: Two times a day (BID) | ORAL | 0 refills | Status: DC | PRN

## 2017-09-01 NOTE — Telephone Encounter (Signed)
Requesting: Clonazepam Contract: No UDS: None Last OV: 7.24.2018 Next OV: Not scheduled Last Refill: 10.17.2018   Please advise

## 2017-09-01 NOTE — Telephone Encounter (Signed)
This is being faxed to the pharmacy/thx dmf

## 2017-09-01 NOTE — Telephone Encounter (Signed)
Copied from [place]. Topic: Quick Communication - See Telephone Encounter >> Sep 01, 2017 11:34 AM Bea Graff, NT wrote: CRM for notification. See Telephone encounter for: Pt called pharmacy and they did not receive the rx refill. The pharmacy asked the doctor to call in the klonopin. Pt is at pharmacy now. [facility] [address].   09/01/17.

## 2017-09-01 NOTE — Telephone Encounter (Signed)
Rx phoned in.   

## 2017-09-15 ENCOUNTER — Other Ambulatory Visit: Payer: Self-pay

## 2017-09-15 MED ORDER — CLONAZEPAM 1 MG PO TABS: 1.0000 mg | ORAL_TABLET | Freq: Two times a day (BID) | ORAL | 1 refills | Status: DC | PRN

## 2017-09-15 NOTE — Telephone Encounter (Signed)
TA-While you were on vacation/[place] ok'ed a refill for a week supply of Klonopin/She was last seen in July/May I print for 30d supply with 1 refill and advise her to schedule appt within 2 months? Plz advise/thx dmf

## 2017-09-15 NOTE — Telephone Encounter (Signed)
Printed/will fax and call pt when signed/thx dmf

## 2017-09-15 NOTE — Telephone Encounter (Signed)
Yes absolutely! Thank you!

## 2017-09-15 NOTE — Progress Notes (Signed)
Printed for 30s+1 with note for pt to sched appt W/I 2 months per TA/will fax when signed/thx dmf

## 2017-09-15 NOTE — Telephone Encounter (Signed)
Pt states she only got 15 tabs of the  clonazePAM (KLONOPIN) 1 MG tablet  Pt states she has been on this med for over a year, and usually gets 60 tabs.  This was only a week supply.  Please advise  [id number] - [place], [place] - [address] AT [address] [phone] (Phone) [phone] (Fax)

## 2017-09-16 NOTE — Telephone Encounter (Signed)
Rx faxed per TA/thx dmf

## 2017-09-20 ENCOUNTER — Other Ambulatory Visit: Payer: Self-pay

## 2017-09-20 ENCOUNTER — Ambulatory Visit: Payer: BLUE CROSS/BLUE SHIELD | Admitting: Family Medicine

## 2017-09-20 ENCOUNTER — Encounter: Payer: Self-pay | Admitting: Family Medicine

## 2017-09-20 VITALS — BP 100/80 | HR 90 | Temp 98.5°F | Ht 67.0 in | Wt 166.8 lb

## 2017-09-20 DIAGNOSIS — H65112 Acute and subacute allergic otitis media (mucoid) (sanguinous) (serous), left ear: Secondary | ICD-10-CM | POA: Diagnosis not present

## 2017-09-20 DIAGNOSIS — J069 Acute upper respiratory infection, unspecified: Secondary | ICD-10-CM | POA: Diagnosis not present

## 2017-09-20 MED ORDER — AMOXICILLIN 500 MG PO CAPS: 1000.0000 mg | ORAL_CAPSULE | Freq: Two times a day (BID) | ORAL | 0 refills | Status: AC

## 2017-09-20 NOTE — Progress Notes (Signed)
Dr. Frederico Hamman T. Asyia Hornung, MD, [facility] [place], [address] Phone: [phone] Fax: [phone]  09/20/2017  Patient: Jill Leonard, MRN: [id number], DOB: 28-Feb-1971, 47 y.o.  Primary Physician:  Lucille Passy, MD   Chief Complaint  Patient presents with  . Cough  . Nasal Congestion  . Fever    low throat  . Ear Pain  . Sore Throat    Feels dry   Subjective:   Jill Leonard is a 47 y.o. very pleasant female patient who presents with the following:  Pleasant patient, who is generally fairly healthy at age 47 who presents with multiple complaints over the last week or so including some low-grade fever, nasal congestion, cough, ear pain, sore throat, and generally not feeling well at all.  Over-the-counter remedies is not really proven to be helpful at all, and she is here for advice for management.  Past Medical History, Surgical History, Social History, Family History, Problem List, Medications, and Allergies have been reviewed and updated if relevant.  Patient Active Problem List   Diagnosis Date Noted  . Perimenopause 04/06/2017  . Generalized anxiety disorder 03/18/2016  . Paroxysmal supraventricular tachycardia ([place]) 03/05/2015  . Encounter for routine gynecological examination 02/08/2014  . Routine general medical examination at a health care facility 02/08/2014  . NEPHROLITHIASIS, HX OF 03/04/2010  . TMJ PAIN 07/06/2008  . CHOLELITHIASIS 07/04/2008  . GERD 06/21/2008  . [place] SYNDROME 06/21/2008  . NONSPEC ELEVATION OF LEVELS OF TRANSAMINASE/LDH 06/21/2008    Past Medical History:  Diagnosis Date  . Anal fissure   . Arthralgia of temporomandibular joint   . Calculus of gallbladder without mention of cholecystitis or obstruction   . Depression   . Fibromyalgia   . GERD (gastroesophageal reflux disease)   . Heart murmur   . History of nephrolithiasis   . HLD (hyperlipidemia)   . Kidney  stones   . Sjogren - Larsson's syndrome   . Sjogren's syndrome [facility]     Past Surgical History:  Procedure Laterality Date  . CHOLECYSTECTOMY  08/20/08  . WISDOM TOOTH EXTRACTION      Social History   Socioeconomic History  . Marital status: Married    Spouse name: Not on file  . Number of children: Not on file  . Years of education: Not on file  . Highest education level: Not on file  Social Needs  . Financial resource strain: Not on file  . Food insecurity - worry: Not on file  . Food insecurity - inability: Not on file  . Transportation needs - medical: Not on file  . Transportation needs - non-medical: Not on file  Occupational History  . Not on file  Tobacco Use  . Smoking status: Never Smoker  . Smokeless tobacco: Never Used  Substance and Sexual Activity  . Alcohol use: Yes    Alcohol/week: 0.0 oz    Comment: Socially-rare  . Drug use: No  . Sexual activity: Not on file  Other Topics Concern  . Not on file  Social History Narrative   Married      Caffeine: 2 cups/day      No regular exercise          Family History  Problem Relation Age of Onset  . Bone cancer Unknown        Grandmother  . Colon polyps Father   . Diabetes Unknown        Grandmother  .  Heart disease Unknown        Grandmother    Allergies  Allergen Reactions  . Albuterol     REACTION: intolerant- "jittery" after use  . Sulfonamide Derivatives     REACTION: intolerant  . Latex Rash    Blisters    Medication list reviewed and updated in full in [place].  ROS: GEN: Acute illness details above GI: Tolerating PO intake GU: maintaining adequate hydration and urination Pulm: No SOB Interactive and getting along well at home.  Otherwise, ROS is as per the HPI.  Objective:   BP 100/80   Pulse 90   Temp 98.5 F (36.9 C) (Oral)   Ht 5\' 7"  (1.702 m)   Wt 166 lb 12 oz (75.6 kg)   SpO2 98%   BMI 26.12 kg/m    Gen: WDWN, NAD; A & O x3, cooperative.  Pleasant.Globally Non-toxic HEENT: Normocephalic and atraumatic. Throat clear, w/o exudate, R TM clear, L TM - bulging TM and reddened in appearance. rhinnorhea.  MMM Frontal sinuses: NT Max sinuses: NT NECK: Anterior cervical  LAD is absent CV: RRR, No M/G/R, cap refill <2 sec PULM: Breathing comfortably in no respiratory distress. no wheezing, crackles, rhonchi EXT: No c/c/e PSYCH: Friendly, good eye contact MSK: Nml gait     Laboratory and Imaging Data:  Assessment and Plan:   Acute mucoid otitis media of left ear  URI, acute  Primary symptoms are consistent with acute URI.  Left ear on exam with pain probable developing otitis media.  I think she should be fine to watch this over the next few days, but if symptoms worsen in her ear, I would go ahead and start amoxicillin.  Follow-up: No Follow-up on file.  Meds ordered this encounter  Medications  . amoxicillin (AMOXIL) 500 MG capsule    Sig: Take 2 capsules (1,000 mg total) by mouth 2 (two) times daily for 10 days.    Dispense:  40 capsule    Refill:  0   Signed,  Masaji Billups T. Hodaya Curto, MD   Allergies as of 09/20/2017      Reactions   Albuterol    REACTION: intolerant- "jittery" after use   Sulfonamide Derivatives    REACTION: intolerant   Latex Rash   Blisters      Medication List        Accurate as of 09/20/17 11:59 PM. Always use your most recent med list.          acetaminophen 500 MG tablet Commonly known as:  TYLENOL Take 1,000 mg by mouth every 8 (eight) hours as needed (pain).   amoxicillin 500 MG capsule Commonly known as:  AMOXIL Take 2 capsules (1,000 mg total) by mouth 2 (two) times daily for 10 days.   aspirin-acetaminophen-caffeine 250-250-65 MG tablet Commonly known as:  EXCEDRIN MIGRAINE Take 2 tablets by mouth every 8 (eight) hours as needed for headache.   cholecalciferol 1000 units tablet Commonly known as:  VITAMIN D Take 1,000 Units by mouth daily.   clonazePAM 1 MG  tablet Commonly known as:  KLONOPIN Take 1 tablet (1 mg total) by mouth 2 (two) times daily as needed. for anxiety   fluticasone 50 MCG/ACT nasal spray Commonly known as:  FLONASE Place 2 sprays into both nostrils daily as needed for allergies or rhinitis.   metoprolol succinate 25 MG 24 hr tablet Commonly known as:  TOPROL-XL TAKE ONE-HALF (1/2) TABLET DAILY   pantoprazole 40 MG tablet Commonly known as:  PROTONIX  TAKE 1 TABLET DAILY   PROBIOTIC DAILY PO Take 1 capsule by mouth daily.   ranitidine 150 MG tablet Commonly known as:  ZANTAC Take 150 mg by mouth 2 (two) times daily as needed for heartburn.

## 2018-01-04 ENCOUNTER — Encounter: Payer: Self-pay | Admitting: Internal Medicine

## 2018-01-04 ENCOUNTER — Ambulatory Visit: Payer: BLUE CROSS/BLUE SHIELD | Admitting: Internal Medicine

## 2018-01-04 VITALS — BP 120/72 | HR 84 | Ht 67.0 in | Wt 157.0 lb

## 2018-01-04 DIAGNOSIS — H9191 Unspecified hearing loss, right ear: Secondary | ICD-10-CM | POA: Diagnosis not present

## 2018-01-04 DIAGNOSIS — M3500 Sicca syndrome, unspecified: Secondary | ICD-10-CM | POA: Diagnosis not present

## 2018-01-04 DIAGNOSIS — M797 Fibromyalgia: Secondary | ICD-10-CM | POA: Diagnosis not present

## 2018-01-04 DIAGNOSIS — F329 Major depressive disorder, single episode, unspecified: Secondary | ICD-10-CM

## 2018-01-04 DIAGNOSIS — F5104 Psychophysiologic insomnia: Secondary | ICD-10-CM

## 2018-01-04 DIAGNOSIS — H9311 Tinnitus, right ear: Secondary | ICD-10-CM | POA: Diagnosis not present

## 2018-01-04 DIAGNOSIS — K219 Gastro-esophageal reflux disease without esophagitis: Secondary | ICD-10-CM

## 2018-01-04 DIAGNOSIS — R8290 Unspecified abnormal findings in urine: Secondary | ICD-10-CM

## 2018-01-04 DIAGNOSIS — F419 Anxiety disorder, unspecified: Secondary | ICD-10-CM | POA: Diagnosis not present

## 2018-01-04 LAB — POCT URINALYSIS DIPSTICK
BILIRUBIN UA: NEGATIVE
Blood, UA: NEGATIVE
GLUCOSE UA: NEGATIVE
KETONES UA: NEGATIVE
Leukocytes, UA: NEGATIVE
NITRITE UA: NEGATIVE
PROTEIN UA: NEGATIVE
Spec Grav, UA: 1.01 (ref 1.010–1.025)
Urobilinogen, UA: 0.2 E.U./dL
pH, UA: 6 (ref 5.0–8.0)

## 2018-01-04 MED ORDER — TRAZODONE HCL 50 MG PO TABS: 25.0000 mg | ORAL_TABLET | Freq: Every evening | ORAL | 0 refills | Status: DC | PRN

## 2018-01-04 NOTE — Progress Notes (Signed)
HPI  Pt presents to the clinic today to establish care and for management of the conditions listed below. She is transferring care from Dr. Deborra Medina.   Anxiety and Depression: Intermittent, depending on her social situations. She takes Clonazepam as needed with good relief. She is not taking any daily medication for anxiety or depression.  She denies depression, SI/HI.  Fibromyalgia: She reports chronic muscle pain and fatigue. She has tried an immune suppressent drug and NSAID's  in the past, but reports they don't work well for her, so she just deals with the pain.   GERD: Worse in the middle of the night with an epmty stomach. She takes Pantoprazole as prescribed. She has breakthrough is she doesn't eat routinely. She has Ranitidine but does not take it.  Insomnia: She has trouble falling asleep and staying asleep. She takes Clonazepam mainly for sleep.  Sjogren's: She is currently not taking any medications. She does not follow with rheumatology at this time.  She also reports ringing in her right ear. This started 1 year ago. She also reports decreased hearing out of right ear. She denies runny nose, nasal congestion or sore throat. She denies any trauma to the ear. She has not tried anything OTC for her symptoms.   She also reports strong urine odor. She denies urgency, frequency or dysuria. She denies fever, chills, nausea or low back pain. She has not tried anything OTC for her symptoms.   Past Medical History:  Diagnosis Date  . Anal fissure   . Arthralgia of temporomandibular joint   . Calculus of gallbladder without mention of cholecystitis or obstruction   . Depression   . Fibromyalgia   . GERD (gastroesophageal reflux disease)   . Heart murmur   . History of nephrolithiasis   . HLD (hyperlipidemia)   . Kidney stones   . Sjogren - Larsson's syndrome   . Sjogren's syndrome ([place])     Current Outpatient Medications  Medication Sig Dispense Refill  . acetaminophen (TYLENOL)  500 MG tablet Take 1,000 mg by mouth every 8 (eight) hours as needed (pain).    Marland Kitchen aspirin-acetaminophen-caffeine (EXCEDRIN MIGRAINE) 250-250-65 MG per tablet Take 2 tablets by mouth every 8 (eight) hours as needed for headache.    . cholecalciferol (VITAMIN D) 1000 UNITS tablet Take 1,000 Units by mouth daily.      . clonazePAM (KLONOPIN) 1 MG tablet Take 1 tablet (1 mg total) by mouth 2 (two) times daily as needed. for anxiety 60 tablet 1  . fluticasone (FLONASE) 50 MCG/ACT nasal spray Place 2 sprays into both nostrils daily as needed for allergies or rhinitis.    . metoprolol succinate (TOPROL-XL) 25 MG 24 hr tablet TAKE ONE-HALF (1/2) TABLET DAILY 45 tablet 3  . pantoprazole (PROTONIX) 40 MG tablet TAKE 1 TABLET DAILY 90 tablet 2  . Probiotic Product (PROBIOTIC DAILY PO) Take 1 capsule by mouth daily.    . ranitidine (ZANTAC) 150 MG tablet Take 150 mg by mouth 2 (two) times daily as needed for heartburn.      No current facility-administered medications for this visit.     Allergies  Allergen Reactions  . Albuterol     REACTION: intolerant- "jittery" after use  . Sulfonamide Derivatives     REACTION: intolerant  . Latex Rash    Blisters    Family History  Problem Relation Age of Onset  . Colon polyps Father   . Bone cancer Unknown        Grandmother  .  Diabetes Unknown        Grandmother  . Heart disease Unknown        Grandmother    Social History   Socioeconomic History  . Marital status: Married    Spouse name: Not on file  . Number of children: Not on file  . Years of education: Not on file  . Highest education level: Not on file  Occupational History  . Not on file  Social Needs  . Financial resource strain: Not on file  . Food insecurity:    Worry: Not on file    Inability: Not on file  . Transportation needs:    Medical: Not on file    Non-medical: Not on file  Tobacco Use  . Smoking status: Never Smoker  . Smokeless tobacco: Never Used  Substance and  Sexual Activity  . Alcohol use: Yes    Alcohol/week: 0.0 oz    Comment: Socially-rare  . Drug use: No  . Sexual activity: Not on file  Lifestyle  . Physical activity:    Days per week: Not on file    Minutes per session: Not on file  . Stress: Not on file  Relationships  . Social connections:    Talks on phone: Not on file    Gets together: Not on file    Attends religious service: Not on file    Active member of club or organization: Not on file    Attends meetings of clubs or organizations: Not on file    Relationship status: Not on file  . Intimate partner violence:    Fear of current or ex partner: Not on file    Emotionally abused: Not on file    Physically abused: Not on file    Forced sexual activity: Not on file  Other Topics Concern  . Not on file  Social History Narrative   Married      Caffeine: 2 cups/day      No regular exercise          ROS:  Constitutional: Denies fever, malaise, fatigue, headache or abrupt weight changes.  HEENT: Pt reports decreased hearing and ringing in right ear. Denies eye pain, eye redness, ear pain, wax buildup, runny nose, nasal congestion, bloody nose, or sore throat. Respiratory: Denies difficulty breathing, shortness of breath, cough or sputum production.   Cardiovascular: Denies chest pain, chest tightness, palpitations or swelling in the hands or feet.  Gastrointestinal: Pt reports intermittent reflux. Denies abdominal pain, bloating, constipation, diarrhea or blood in the stool.  GU: Pt reports urine odor. Denies frequency, urgency, pain with urination, blood in urine, or discharge. Musculoskeletal: Pt reports chronic muscle/joint pain. Denies decrease in range of motion, difficulty with gait, or joint swelling.  Skin: Denies redness, rashes, lesions or ulcercations.  Neurological: Pt reports insomnia. Denies dizziness, difficulty with memory, difficulty with speech or problems with balance and coordination.  Psych: Pt has  a history of anxiety and depression. Denies SI/HI.  No other specific complaints in a complete review of systems (except as listed in HPI above).  PE:  BP 120/72 (BP Location: Right Arm, Patient Position: Sitting, Cuff Size: Normal)   Pulse 84   Ht 5\' 7"  (1.702 m)   Wt 157 lb (71.2 kg)   SpO2 99%   BMI 24.59 kg/m  Wt Readings from Last 3 Encounters:  01/04/18 157 lb (71.2 kg)  09/20/17 166 lb 12 oz (75.6 kg)  07/13/17 162 lb (73.5 kg)    General:  Appears her stated age, well developed, well nourished in NAD. Cardiovascular: Normal rate and rhythm. S1,S2 noted.  No murmur, rubs or gallops noted.  Pulmonary/Chest: Normal effort and positive vesicular breath sounds. No respiratory distress. No wheezes, rales or ronchi noted.  Abdomen: Soft and nontender. Normal bowel sounds. Musculoskeletal: Strength 5/5 BUE/BLE. No difficulty with gait.  Neurological: Alert and oriented. Psychiatric: Mood and affect normal. Behavior is normal. Judgment and thought content normal.     BMET    Component Value Date/Time   NA 138 04/06/2017 0849   K 3.9 04/06/2017 0849   CL 103 04/06/2017 0849   CO2 31 04/06/2017 0849   GLUCOSE 85 04/06/2017 0849   BUN 8 04/06/2017 0849   CREATININE 0.78 04/06/2017 0849   CALCIUM 9.7 04/06/2017 0849   GFRNONAA >90 09/15/2014 1528   GFRAA >90 09/15/2014 1528    Lipid Panel     Component Value Date/Time   CHOL 208 (H) 04/06/2017 0849   TRIG 122.0 04/06/2017 0849   HDL 45.90 04/06/2017 0849   CHOLHDL 5 04/06/2017 0849   VLDL 24.4 04/06/2017 0849   LDLCALC 138 (H) 04/06/2017 0849    CBC    Component Value Date/Time   WBC 4.9 04/06/2017 0849   RBC 5.13 (H) 04/06/2017 0849   HGB 14.6 04/06/2017 0849   HCT 43.7 04/06/2017 0849   PLT 311.0 04/06/2017 0849   MCV 85.2 04/06/2017 0849   MCV 85.1 12/13/2011 1127   MCH 28.1 09/15/2014 1528   MCHC 33.5 04/06/2017 0849   RDW 14.1 04/06/2017 0849   LYMPHSABS 1.7 04/06/2017 0849   MONOABS 0.5 04/06/2017  0849   EOSABS 0.2 04/06/2017 0849   BASOSABS 0.0 04/06/2017 0849    Hgb A1C No results found for: HGBA1C   Assessment and Plan:  Urine Odor:  Urinalysis normal Push fluids  Ringing in Right Ear/Decreased Hearing:  Exam benign Can try Flonase to see if that helps Offered referral to endocrinology but she declines at this time  Make an appt for your annual exam Webb Silversmith, NP

## 2018-01-09 DIAGNOSIS — M3500 Sicca syndrome, unspecified: Secondary | ICD-10-CM | POA: Insufficient documentation

## 2018-01-09 DIAGNOSIS — M797 Fibromyalgia: Secondary | ICD-10-CM | POA: Insufficient documentation

## 2018-01-09 DIAGNOSIS — F339 Major depressive disorder, recurrent, unspecified: Secondary | ICD-10-CM | POA: Insufficient documentation

## 2018-01-09 DIAGNOSIS — G4700 Insomnia, unspecified: Secondary | ICD-10-CM | POA: Insufficient documentation

## 2018-01-09 DIAGNOSIS — F329 Major depressive disorder, single episode, unspecified: Secondary | ICD-10-CM | POA: Insufficient documentation

## 2018-01-09 DIAGNOSIS — F419 Anxiety disorder, unspecified: Secondary | ICD-10-CM

## 2018-01-09 NOTE — Assessment & Plan Note (Signed)
Will trial Trazadone to try to get her off the Clonazepam, eRx sent to pharmacy Advised her to not take Trazadone and Clonazpem together

## 2018-01-09 NOTE — Patient Instructions (Signed)

## 2018-01-09 NOTE — Assessment & Plan Note (Signed)
Advised her I did not want her taking Clonazepam routinely without being on daily therapy first We did not make any changes to her medication at this time

## 2018-01-09 NOTE — Assessment & Plan Note (Signed)
Encouraged her to start a routine physical exercise regimen

## 2018-01-09 NOTE — Assessment & Plan Note (Signed)
Encouraged her to eat at least every 4 hours  Continue Pantoprazole for now

## 2018-01-09 NOTE — Assessment & Plan Note (Signed)
Not medicated She is not interested in following with rheumatology at this time

## 2018-01-12 ENCOUNTER — Ambulatory Visit: Payer: Self-pay

## 2018-01-12 ENCOUNTER — Encounter (HOSPITAL_COMMUNITY): Payer: Self-pay | Admitting: Emergency Medicine

## 2018-01-12 ENCOUNTER — Ambulatory Visit (HOSPITAL_COMMUNITY)
Admission: EM | Admit: 2018-01-12 | Discharge: 2018-01-12 | Disposition: A | Discharge status: Home / Self Care | Payer: BLUE CROSS/BLUE SHIELD | Attending: Urgent Care | Admitting: Urgent Care

## 2018-01-12 DIAGNOSIS — R0789 Other chest pain: Secondary | ICD-10-CM

## 2018-01-12 DIAGNOSIS — F419 Anxiety disorder, unspecified: Secondary | ICD-10-CM

## 2018-01-12 DIAGNOSIS — G4700 Insomnia, unspecified: Secondary | ICD-10-CM

## 2018-01-12 NOTE — Telephone Encounter (Addendum)
Pt. Reports she started her Trazodone last week as ordered. States the medication mad her "jittery, I couldn't sleep. Made me anxious." Decided to stop the medication and this morning around 0400 woke up feeling anxious and had chest pain/heaviness. Rates it at a "5". No radiating pain. States "I think it is from the medication." Home BP monitor shows BP  145/98  Pulse 77. States she took" 1/2 a Forensic scientist and it helped a little." Instructed pt. To go to Ed for evaluation. States she does not want to. "I might go to urgent care." Stressed importance of her being evaluated for this chest pain. Verbalizes understanding. Reason for Disposition . [1] Chest pain lasts > 5 minutes AND [2] occurred > 3 days ago (72 hours) AND [3] NO chest pain or cardiac symptoms now  Answer Assessment - Initial Assessment Questions 1. LOCATION: "Where does it hurt?"       More on left side over the breast 2. RADIATION: "Does the pain go anywhere else?" (e.g., into neck, jaw, arms, back)     No 3. ONSET: "When did the chest pain begin?" (Minutes, hours or days)      0400 4. PATTERN "Does the pain come and go, or has it been constant since it started?"  "Does it get worse with exertion?"      Constant 5. DURATION: "How long does it last" (e.g., seconds, minutes, hours)     Minutes 6. SEVERITY: "How bad is the pain?"  (e.g., Scale 1-10; mild, moderate, or severe)    - MILD (1-3): doesn't interfere with normal activities     - MODERATE (4-7): interferes with normal activities or awakens from sleep    - SEVERE (8-10): excruciating pain, unable to do any normal activities       5 7. CARDIAC RISK FACTORS: "Do you have any history of heart problems or risk factors for heart disease?" (e.g., prior heart attack, angina; high blood pressure, diabetes, being overweight, high cholesterol, smoking, or strong family history of heart disease)     Palpitations and tachycardia 8. PULMONARY RISK FACTORS: "Do you have any history of lung  disease?"  (e.g., blood clots in lung, asthma, emphysema, birth control pills)     No 9. CAUSE: "What do you think is causing the chest pain?"     Reaction to stopping trazadone 10. OTHER SYMPTOMS: "Do you have any other symptoms?" (e.g., dizziness, nausea, vomiting, sweating, fever, difficulty breathing, cough)       Feels shaky, jittery and nervous 11. PREGNANCY: "Is there any chance you are pregnant?" "When was your last menstrual period?"       No  Protocols used: CHEST PAIN-A-AH

## 2018-01-12 NOTE — ED Triage Notes (Signed)
Pt here for anxiety and chest pain last night; pt stopped taking Klonopin 4 days ago and thinks is withdrawal sx; pt took 1/2 Klonopin today with relief of all sx

## 2018-01-12 NOTE — Telephone Encounter (Signed)
I spoke with pt; pt said had taken Trazodone for 3 nights and last night was first night of not taking Trazodone. Pt has just arrived at [facility]. FYI to Avie Echevaria NP.

## 2018-01-12 NOTE — Telephone Encounter (Signed)
noted 

## 2018-01-12 NOTE — ED Provider Notes (Signed)
MRN: [id number] DOB: [birth year]  Subjective:   Jill Leonard is a 47 y.o. female presenting for 1 day history of anxiety, chest pain. Patient has been trying to switch from Klonopin to Trazodone. She is working with her PCP on this. She primarily has difficulty with her sleep and has used Klonopin for years. She used to be at 0.5mg  before that stopped working and went up to 1mg . The new PCP she has would like her to come off of Klonopin and patient is agreeable except she wants something else to replace this for her sleep. Today, she would like to make sure her heart is okay as she was advised by her PCP's practice to come in for this given her chest pain.   No current facility-administered medications for this encounter.   Current Outpatient Medications:  .  acetaminophen (TYLENOL) 500 MG tablet, Take 1,000 mg by mouth every 8 (eight) hours as needed (pain)., Disp: , Rfl:  .  aspirin-acetaminophen-caffeine (EXCEDRIN MIGRAINE) 250-250-65 MG per tablet, Take 2 tablets by mouth every 8 (eight) hours as needed for headache., Disp: , Rfl:  .  cholecalciferol (VITAMIN D) 1000 UNITS tablet, Take 1,000 Units by mouth daily.  , Disp: , Rfl:  .  clonazePAM (KLONOPIN) 1 MG tablet, Take 1 tablet (1 mg total) by mouth 2 (two) times daily as needed. for anxiety, Disp: 60 tablet, Rfl: 1 .  fluticasone (FLONASE) 50 MCG/ACT nasal spray, Place 2 sprays into both nostrils daily as needed for allergies or rhinitis., Disp: , Rfl:  .  metoprolol succinate (TOPROL-XL) 25 MG 24 hr tablet, TAKE ONE-HALF (1/2) TABLET DAILY, Disp: 45 tablet, Rfl: 3 .  pantoprazole (PROTONIX) 40 MG tablet, TAKE 1 TABLET DAILY, Disp: 90 tablet, Rfl: 2 .  Probiotic Product (PROBIOTIC DAILY PO), Take 1 capsule by mouth daily., Disp: , Rfl:  .  ranitidine (ZANTAC) 150 MG tablet, Take 150 mg by mouth 2 (two) times daily as needed for heartburn. , Disp: , Rfl:  .  traZODone (DESYREL) 50 MG tablet, Take 0.5-1 tablets (25-50 mg total) by mouth at  bedtime as needed for sleep., Disp: 90 tablet, Rfl: 0    Allergies  Allergen Reactions  . Albuterol     REACTION: intolerant- "jittery" after use  . Sulfonamide Derivatives     REACTION: intolerant  . Trazodone And Nefazodone Other (See Comments)    Jittery and anxious and difficulty sleeping.  . Latex Rash    Blisters    Past Medical History:  Diagnosis Date  . Anal fissure   . Arthralgia of temporomandibular joint   . Calculus of gallbladder without mention of cholecystitis or obstruction   . Depression   . Fibromyalgia   . GERD (gastroesophageal reflux disease)   . Heart murmur   . History of nephrolithiasis   . HLD (hyperlipidemia)   . Kidney stones   . Sjogren - Larsson's syndrome   . Sjogren's syndrome [facility])      Past Surgical History:  Procedure Laterality Date  . CHOLECYSTECTOMY  08/20/08  . WISDOM TOOTH EXTRACTION      Objective:   Vitals: BP 120/82 (BP Location: Right Arm)   Pulse 87   Temp 98.3 F (36.8 C) (Oral)   Resp 18   SpO2 98%   BP Readings from Last 3 Encounters:  01/12/18 120/82  01/04/18 120/72  09/20/17 100/80   Physical Exam  Constitutional: She is oriented to person, place, and time. She appears well-developed and well-nourished.  Cardiovascular: Normal  rate, regular rhythm and intact distal pulses. Exam reveals no gallop and no friction rub.  No murmur heard. Pulmonary/Chest: No respiratory distress. She has no wheezes. She has no rales.  Neurological: She is alert and oriented to person, place, and time.  Psychiatric: She has a normal mood and affect.   ED ECG REPORT   Date: 01/12/2018  Rate: 93bpm  Rhythm: normal sinus rhythm  QRS Axis: normal  Intervals: normal  ST/T Wave abnormalities: Non-specific T-wave flattening in leads V1, V3, V4-V6  Conduction Disutrbances:none  Narrative Interpretation: Sinus rhythm at 93bpm with non-specific t-wave flattening in leads as noted above. Largely unchanged from previous ecg.  Old  EKG Reviewed: unchanged compared to ecg from 06/2017  I have personally reviewed the EKG tracing and agree with the computerized printout as noted.  Assessment and Plan :   Atypical chest pain  Anxiety  Insomnia, unspecified type  Physical exam findings and ecg reassuring today. Follow up with PCP for continued management of benzodiazepine use, insomnia and anxiety.   Jaynee Eagles, PA-C 01/12/18 1226

## 2018-01-13 ENCOUNTER — Other Ambulatory Visit: Payer: Self-pay | Admitting: Family Medicine

## 2018-01-14 ENCOUNTER — Encounter: Payer: Self-pay | Admitting: Internal Medicine

## 2018-01-31 ENCOUNTER — Encounter: Payer: Self-pay | Admitting: Family Medicine

## 2018-01-31 ENCOUNTER — Ambulatory Visit (INDEPENDENT_AMBULATORY_CARE_PROVIDER_SITE_OTHER): Payer: BLUE CROSS/BLUE SHIELD | Admitting: Family Medicine

## 2018-01-31 ENCOUNTER — Other Ambulatory Visit (HOSPITAL_COMMUNITY)
Admission: RE | Admit: 2018-01-31 | Discharge: 2018-01-31 | Disposition: A | Discharge status: Home / Self Care | Payer: BLUE CROSS/BLUE SHIELD | Source: Ambulatory Visit | Attending: Family Medicine | Admitting: Family Medicine

## 2018-01-31 VITALS — BP 118/76 | HR 104 | Temp 98.5°F | Ht 67.0 in | Wt 156.5 lb

## 2018-01-31 DIAGNOSIS — N859 Noninflammatory disorder of uterus, unspecified: Secondary | ICD-10-CM

## 2018-01-31 DIAGNOSIS — Z124 Encounter for screening for malignant neoplasm of cervix: Secondary | ICD-10-CM | POA: Insufficient documentation

## 2018-01-31 DIAGNOSIS — M79672 Pain in left foot: Secondary | ICD-10-CM | POA: Diagnosis not present

## 2018-01-31 DIAGNOSIS — D259 Leiomyoma of uterus, unspecified: Secondary | ICD-10-CM | POA: Diagnosis not present

## 2018-01-31 DIAGNOSIS — Z1151 Encounter for screening for human papillomavirus (HPV): Secondary | ICD-10-CM | POA: Insufficient documentation

## 2018-01-31 DIAGNOSIS — M79671 Pain in right foot: Secondary | ICD-10-CM | POA: Diagnosis not present

## 2018-01-31 DIAGNOSIS — N858 Other specified noninflammatory disorders of uterus: Secondary | ICD-10-CM

## 2018-01-31 NOTE — Progress Notes (Signed)
V

## 2018-01-31 NOTE — Progress Notes (Signed)
Subjective:    Patient ID: Jill Leonard, female    DOB: [birth year], 47 y.o.   MRN: [id number]  HPI This is a 47 yo female who presents today with "cyst" on her lower left abdomen x 1 month. Feels tender. Seemed a little bigger this morning. Has been having irregular periods, mood swings for over a year. Occasionally periods are very heavy. Feels like she has an odor, did a course of otc monistat. No itching or burning. Vaginal discharge since her son was born 49 years ago. Has constipation typically, has tried [place] lax. Has had diarrhea for last two days. Has lost 10 pounds in last 5 months. States her activity level is higher with warmer weather. Not sexually active for about 9 years, husband with no libido.   Chronic bilateral foot pain, burning. Has tried multiple shoes, inserts. Has swelling and pain daily. Unable to walk for long periods of time. Able to ride recumbent exercise bike.   Has been coming off her clonazepam. Weaned over 2 weeks, stopped 9 days ago. Still feels rough.    Past Medical History:  Diagnosis Date  . Anal fissure   . Arthralgia of temporomandibular joint   . Calculus of gallbladder without mention of cholecystitis or obstruction   . Depression   . Fibromyalgia   . GERD (gastroesophageal reflux disease)   . Heart murmur   . History of nephrolithiasis   . HLD (hyperlipidemia)   . Kidney stones   . Sjogren - Larsson's syndrome   . Sjogren's syndrome [facility])    Past Surgical History:  Procedure Laterality Date  . CHOLECYSTECTOMY  08/20/08  . WISDOM TOOTH EXTRACTION     Family History  Problem Relation Age of Onset  . Colon polyps Father   . Bone cancer Unknown        Grandmother  . Diabetes Unknown        Grandmother  . Heart disease Unknown        Grandmother   Social History   Tobacco Use  . Smoking status: Never Smoker  . Smokeless tobacco: Never Used  Substance Use Topics  . Alcohol use: Yes    Alcohol/week: 0.0 oz    Comment:  Socially-rare  . Drug use: No      Review of Systems Per HPI    Objective:   Physical Exam  Constitutional: She is oriented to person, place, and time. She appears well-developed and well-nourished. No distress.  Eyes: Conjunctivae are normal.  Cardiovascular: Normal rate.  Pulmonary/Chest: Effort normal.  Abdominal: Normal appearance. There is tenderness (mildly at llq). There is no rebound and no guarding.    Genitourinary: Pelvic exam was performed with patient supine. There is no rash, tenderness, lesion or injury on the right labia. There is no rash, tenderness, lesion or injury on the left labia.  Genitourinary Comments: Able to palpate mass left of midline on bimanual exam.   Neurological: She is alert and oriented to person, place, and time.  Skin: Skin is warm and dry. She is not diaphoretic.  Psychiatric: She has a normal mood and affect. Her behavior is normal. Judgment and thought content normal.  Vitals reviewed.     BP 118/76 (BP Location: Left Arm, Patient Position: Sitting, Cuff Size: Normal)   Pulse (!) 104   Temp 98.5 F (36.9 C) (Oral)   Ht 5\' 7"  (1.702 m)   Wt 156 lb 8 oz (71 kg)   SpO2 98%   BMI 24.51 kg/m  Wt Readings from Last 3 Encounters:  01/31/18 156 lb 8 oz (71 kg)  01/04/18 157 lb (71.2 kg)  09/20/17 166 lb 12 oz (75.6 kg)       Assessment & Plan:  1. Screening for cervical cancer - PAP obtained today with work up of mass - Cytology - PAP  2. Uterine mass - US PELVIC COMPLETE WITH TRANSVAGINAL; Future  3. Bilateral foot pain - Ambulatory referral to [place], FNP-BC  [place] [facility]  02/01/2018 12:24 PM

## 2018-01-31 NOTE — Patient Instructions (Signed)
Good to see you today  I'll be in touch as soon as I get ultrasound results  We'll plan follow up based on results  Stop at desk to schedule ultrasound and podiatry appointment

## 2018-02-01 ENCOUNTER — Encounter: Payer: Self-pay | Admitting: Family Medicine

## 2018-02-03 ENCOUNTER — Ambulatory Visit (HOSPITAL_BASED_OUTPATIENT_CLINIC_OR_DEPARTMENT_OTHER)
Admission: RE | Admit: 2018-02-03 | Discharge: 2018-02-03 | Disposition: A | Discharge status: Home / Self Care | Payer: BLUE CROSS/BLUE SHIELD | Source: Ambulatory Visit | Attending: Family Medicine | Admitting: Family Medicine

## 2018-02-03 ENCOUNTER — Telehealth: Payer: Self-pay

## 2018-02-03 DIAGNOSIS — N83201 Unspecified ovarian cyst, right side: Secondary | ICD-10-CM | POA: Diagnosis not present

## 2018-02-03 DIAGNOSIS — N858 Other specified noninflammatory disorders of uterus: Secondary | ICD-10-CM

## 2018-02-03 DIAGNOSIS — N859 Noninflammatory disorder of uterus, unspecified: Secondary | ICD-10-CM | POA: Diagnosis not present

## 2018-02-03 DIAGNOSIS — N83202 Unspecified ovarian cyst, left side: Secondary | ICD-10-CM | POA: Insufficient documentation

## 2018-02-03 DIAGNOSIS — D259 Leiomyoma of uterus, unspecified: Secondary | ICD-10-CM | POA: Insufficient documentation

## 2018-02-03 NOTE — Telephone Encounter (Signed)
Mary with cytology wanted to verify orders for cytology sent on 01/31/18. Mary advised per pt office note;  Summary: Today, [place] Collection Date-01/31/2018 Collection Time-12:00 AM Cytology Pap Ancillary Testing: HPV w/PAP any interp., gonorrhea, candida Specimen Source: Endocervix/Cervix  Mary voiced understanding and nothing further needed. FYI to Glenda Chroman FNP.

## 2018-02-04 NOTE — Addendum Note (Signed)
Addended by: Clarene Reamer B on: 02/04/2018 08:33 AM   Modules accepted: Orders

## 2018-02-15 LAB — CYTOLOGY - PAP
CANDIDA VAGINITIS: NEGATIVE
DIAGNOSIS: NEGATIVE
HPV (WINDOPATH): NOT DETECTED
Neisseria Gonorrhea: NEGATIVE

## 2018-02-21 ENCOUNTER — Ambulatory Visit: Payer: Self-pay | Admitting: Podiatry

## 2018-02-24 DIAGNOSIS — D259 Leiomyoma of uterus, unspecified: Secondary | ICD-10-CM | POA: Diagnosis not present

## 2018-02-24 DIAGNOSIS — N920 Excessive and frequent menstruation with regular cycle: Secondary | ICD-10-CM | POA: Diagnosis not present

## 2018-02-24 DIAGNOSIS — Z6824 Body mass index (BMI) 24.0-24.9, adult: Secondary | ICD-10-CM | POA: Diagnosis not present

## 2018-02-28 ENCOUNTER — Other Ambulatory Visit: Payer: Self-pay | Admitting: Family Medicine

## 2018-02-28 NOTE — Telephone Encounter (Signed)
DG-Plz see refill req/thx dmf

## 2018-03-28 DIAGNOSIS — G43109 Migraine with aura, not intractable, without status migrainosus: Secondary | ICD-10-CM | POA: Diagnosis not present

## 2018-03-28 DIAGNOSIS — H538 Other visual disturbances: Secondary | ICD-10-CM | POA: Diagnosis not present

## 2018-05-05 DIAGNOSIS — H52203 Unspecified astigmatism, bilateral: Secondary | ICD-10-CM | POA: Diagnosis not present

## 2018-05-05 DIAGNOSIS — H524 Presbyopia: Secondary | ICD-10-CM | POA: Diagnosis not present

## 2018-05-05 DIAGNOSIS — H5213 Myopia, bilateral: Secondary | ICD-10-CM | POA: Diagnosis not present

## 2018-05-05 DIAGNOSIS — H04123 Dry eye syndrome of bilateral lacrimal glands: Secondary | ICD-10-CM | POA: Diagnosis not present

## 2018-05-23 DIAGNOSIS — Z1231 Encounter for screening mammogram for malignant neoplasm of breast: Secondary | ICD-10-CM | POA: Diagnosis not present

## 2018-05-23 LAB — HM MAMMOGRAPHY

## 2018-05-25 NOTE — Telephone Encounter (Signed)
Noted  

## 2018-06-02 DIAGNOSIS — D259 Leiomyoma of uterus, unspecified: Secondary | ICD-10-CM | POA: Diagnosis not present

## 2018-06-06 DIAGNOSIS — H9071 Mixed conductive and sensorineural hearing loss, unilateral, right ear, with unrestricted hearing on the contralateral side: Secondary | ICD-10-CM | POA: Insufficient documentation

## 2018-06-06 DIAGNOSIS — H9313 Tinnitus, bilateral: Secondary | ICD-10-CM | POA: Diagnosis not present

## 2018-06-06 DIAGNOSIS — H9193 Unspecified hearing loss, bilateral: Secondary | ICD-10-CM | POA: Diagnosis not present

## 2018-06-07 ENCOUNTER — Other Ambulatory Visit: Payer: Self-pay | Admitting: Family Medicine

## 2018-06-07 DIAGNOSIS — E7800 Pure hypercholesterolemia, unspecified: Secondary | ICD-10-CM

## 2018-06-07 DIAGNOSIS — M3500 Sicca syndrome, unspecified: Secondary | ICD-10-CM

## 2018-06-07 NOTE — Progress Notes (Signed)
Lab orders entered for upcoming CPE.

## 2018-06-08 ENCOUNTER — Encounter: Payer: Self-pay | Admitting: Family Medicine

## 2018-06-10 ENCOUNTER — Other Ambulatory Visit (INDEPENDENT_AMBULATORY_CARE_PROVIDER_SITE_OTHER): Payer: BLUE CROSS/BLUE SHIELD

## 2018-06-10 DIAGNOSIS — E7800 Pure hypercholesterolemia, unspecified: Secondary | ICD-10-CM

## 2018-06-10 DIAGNOSIS — M3500 Sicca syndrome, unspecified: Secondary | ICD-10-CM | POA: Diagnosis not present

## 2018-06-10 LAB — BASIC METABOLIC PANEL
BUN: 11 mg/dL (ref 6–23)
CALCIUM: 9.2 mg/dL (ref 8.4–10.5)
CHLORIDE: 105 meq/L (ref 96–112)
CO2: 27 mEq/L (ref 19–32)
CREATININE: 1.01 mg/dL (ref 0.40–1.20)
GFR: 62.45 mL/min (ref >60.00)
Glucose, Bld: 82 mg/dL (ref 70–99)
Potassium: 4 mEq/L (ref 3.5–5.1)
Sodium: 138 mEq/L (ref 135–145)

## 2018-06-10 LAB — CBC WITH DIFFERENTIAL/PLATELET
BASOS PCT: 1.5 % (ref 0.0–3.0)
Basophils Absolute: 0.1 10*3/uL (ref 0.0–0.1)
Eosinophils Absolute: 0.1 10*3/uL (ref 0.0–0.7)
Eosinophils Relative: 1.8 % (ref 0.0–5.0)
HEMATOCRIT: 40.8 % (ref 36.0–46.0)
HEMOGLOBIN: 14.2 g/dL (ref 12.0–15.0)
Lymphocytes Relative: 38.8 % (ref 12.0–46.0)
Lymphs Abs: 1.8 10*3/uL (ref 0.7–4.0)
MCHC: 34.9 g/dL (ref 30.0–36.0)
MCV: 81.7 fl (ref 78.0–100.0)
MONOS PCT: 10.6 % (ref 3.0–12.0)
Monocytes Absolute: 0.5 10*3/uL (ref 0.1–1.0)
Neutro Abs: 2.1 10*3/uL (ref 1.4–7.7)
Neutrophils Relative %: 47.3 % (ref 43.0–77.0)
Platelets: 297 10*3/uL (ref 150.0–400.0)
RBC: 4.99 Mil/uL (ref 3.87–5.11)
RDW: 14.3 % (ref 11.5–15.5)
WBC: 4.5 10*3/uL (ref 4.0–10.5)

## 2018-06-10 LAB — LIPID PANEL
CHOL/HDL RATIO: 4
Cholesterol: 200 mg/dL (ref 0–200)
HDL: 48.2 mg/dL (ref >39.00)
LDL CALC: 121 mg/dL — AB (ref 0–99)
NonHDL: 151.47
TRIGLYCERIDES: 152 mg/dL — AB (ref 0.0–149.0)
VLDL: 30.4 mg/dL (ref 0.0–40.0)

## 2018-06-10 NOTE — Progress Notes (Signed)
lab

## 2018-06-15 ENCOUNTER — Other Ambulatory Visit: Payer: Self-pay | Admitting: Obstetrics and Gynecology

## 2018-06-17 ENCOUNTER — Ambulatory Visit (INDEPENDENT_AMBULATORY_CARE_PROVIDER_SITE_OTHER): Payer: BLUE CROSS/BLUE SHIELD | Admitting: Family Medicine

## 2018-06-17 ENCOUNTER — Encounter: Payer: Self-pay | Admitting: Family Medicine

## 2018-06-17 VITALS — BP 102/72 | HR 85 | Temp 97.8°F | Ht 66.5 in | Wt 153.0 lb

## 2018-06-17 DIAGNOSIS — Z0000 Encounter for general adult medical examination without abnormal findings: Secondary | ICD-10-CM | POA: Diagnosis not present

## 2018-06-17 NOTE — Progress Notes (Signed)
Subjective:    Patient ID: Jill Leonard, female    DOB: Oct 26, 1970, 47 y.o.   MRN: [id number]  HPI This is a 47 yo female who presents today for CPE. Has been doing well. Has hysterectomy scheduled for next month for large fibroid.   Last CPE-  04/06/17 Mammo- 05/23/18 Pap- 01/31/18 Colonoscopy- 06/25/08 Tdap- declines  Flu- at work [employer]- recent Dental- regular Exercise- staying active with 34 yo son    Past Medical History:  Diagnosis Date  . Anal fissure   . Arthralgia of temporomandibular joint   . Calculus of gallbladder without mention of cholecystitis or obstruction   . Depression   . Fibromyalgia   . GERD (gastroesophageal reflux disease)   . Heart murmur   . History of nephrolithiasis   . HLD (hyperlipidemia)   . Kidney stones   . Sjogren - Larsson's syndrome   . Sjogren's syndrome [facility])    Past Surgical History:  Procedure Laterality Date  . CHOLECYSTECTOMY  08/20/08  . WISDOM TOOTH EXTRACTION     Family History  Problem Relation Age of Onset  . Colon polyps Father   . Bone cancer Unknown        Grandmother  . Diabetes Unknown        Grandmother  . Heart disease Unknown        Grandmother   Social History   Tobacco Use  . Smoking status: Never Smoker  . Smokeless tobacco: Never Used  Substance Use Topics  . Alcohol use: Yes    Alcohol/week: 0.0 standard drinks    Comment: Socially-rare  . Drug use: No      Review of Systems  Constitutional: Negative.   HENT: Positive for hearing loss (seeing specialist) and postnasal drip (with recent weather change).   Eyes: Negative.   Respiratory: Negative.   Cardiovascular: Negative.   Gastrointestinal: Negative.   Endocrine: Negative.   Genitourinary: Negative.   Musculoskeletal:       Intermittent bilateral foot pain  Skin: Negative.   Allergic/Immunologic: Negative.   Neurological: Negative.   Hematological: Negative.   Psychiatric/Behavioral: Positive for sleep disturbance (occasionally  awakens. Takes occasional diphenhydramine. ). Negative for dysphoric mood. The patient is not nervous/anxious.        Objective:   Physical Exam Physical Exam  Constitutional: She is oriented to person, place, and time. She appears well-developed and well-nourished. No distress.  HENT:  Head: Normocephalic and atraumatic.  Right Ear: External ear normal.  Left Ear: External ear normal.  Nose: Nose normal.  Mouth/Throat: Oropharynx is clear and moist. No oropharyngeal exudate.  Eyes: Conjunctivae are normal. Pupils are equal, round, and reactive to light.  Neck: Normal range of motion. Neck supple. No JVD present. No thyromegaly present.  Cardiovascular: Normal rate, regular rhythm, normal heart sounds and intact distal pulses.   Pulmonary/Chest: Effort normal and breath sounds normal. Right breast exhibits no inverted nipple, no mass, no nipple discharge, no skin change and no tenderness. Left breast exhibits no inverted nipple, no mass, no nipple discharge, no skin change and no tenderness. Breasts are symmetrical.  Abdominal: Soft. Bowel sounds are normal. She exhibits no distension and no mass. There is no tenderness. There is no rebound and no guarding.  Musculoskeletal: Normal range of motion. She exhibits no edema or tenderness.  Lymphadenopathy:    She has no cervical adenopathy.  Neurological: She is alert and oriented to person, place, and time.  Skin: Skin is warm and dry. She is  not diaphoretic.  Psychiatric: She has a normal mood and affect. Her behavior is normal. Judgment and thought content normal.  Vitals reviewed.     BP 102/72 (BP Location: Left Arm, Patient Position: Sitting, Cuff Size: Normal)   Pulse 85   Temp 97.8 F (36.6 C) (Oral)   Ht 5' 6.5" (1.689 m)   Wt 153 lb (69.4 kg)   SpO2 98%   BMI 24.32 kg/m  Wt Readings from Last 3 Encounters:  06/17/18 153 lb (69.4 kg)  01/31/18 156 lb 8 oz (71 kg)  01/04/18 157 lb (71.2 kg)       Assessment & Plan:    1. Annual physical exam - Discussed and encouraged healthy lifestyle choices- adequate sleep, regular exercise, stress management and healthy food choices.  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

## 2018-06-17 NOTE — Patient Instructions (Signed)
Good to see you today Good luck with your surgery!  Preventive Care 40-64 Years, Female Preventive care refers to lifestyle choices and visits with your health care provider that can promote health and wellness. What does preventive care include?  A yearly physical exam. This is also called an annual well check.  Dental exams once or twice a year.  Routine eye exams. Ask your health care provider how often you should have your eyes checked.  Personal lifestyle choices, including: ? Daily care of your teeth and gums. ? Regular physical activity. ? Eating a healthy diet. ? Avoiding tobacco and drug use. ? Limiting alcohol use. ? Practicing safe sex. ? Taking low-dose aspirin daily starting at age 32. ? Taking vitamin and mineral supplements as recommended by your health care provider. What happens during an annual well check? The services and screenings done by your health care provider during your annual well check will depend on your age, overall health, lifestyle risk factors, and family history of disease. Counseling Your health care provider may ask you questions about your:  Alcohol use.  Tobacco use.  Drug use.  Emotional well-being.  Home and relationship well-being.  Sexual activity.  Eating habits.  Work and work Statistician.  Method of birth control.  Menstrual cycle.  Pregnancy history.  Screening You may have the following tests or measurements:  Height, weight, and BMI.  Blood pressure.  Lipid and cholesterol levels. These may be checked every 5 years, or more frequently if you are over 36 years old.  Skin check.  Lung cancer screening. You may have this screening every year starting at age 38 if you have a 30-pack-year history of smoking and currently smoke or have quit within the past 15 years.  Fecal occult blood test (FOBT) of the stool. You may have this test every year starting at age 89.  Flexible sigmoidoscopy or colonoscopy. You may  have a sigmoidoscopy every 5 years or a colonoscopy every 10 years starting at age 9.  Hepatitis C blood test.  Hepatitis B blood test.  Sexually transmitted disease (STD) testing.  Diabetes screening. This is done by checking your blood sugar (glucose) after you have not eaten for a while (fasting). You may have this done every 1-3 years.  Mammogram. This may be done every 1-2 years. Talk to your health care provider about when you should start having regular mammograms. This may depend on whether you have a family history of breast cancer.  BRCA-related cancer screening. This may be done if you have a family history of breast, ovarian, tubal, or peritoneal cancers.  Pelvic exam and Pap test. This may be done every 3 years starting at age 3. Starting at age 68, this may be done every 5 years if you have a Pap test in combination with an HPV test.  Bone density scan. This is done to screen for osteoporosis. You may have this scan if you are at high risk for osteoporosis.  Discuss your test results, treatment options, and if necessary, the need for more tests with your health care provider. Vaccines Your health care provider may recommend certain vaccines, such as:  Influenza vaccine. This is recommended every year.  Tetanus, diphtheria, and acellular pertussis (Tdap, Td) vaccine. You may need a Td booster every 10 years.  Varicella vaccine. You may need this if you have not been vaccinated.  Zoster vaccine. You may need this after age 51.  Measles, mumps, and rubella (MMR) vaccine. You may need  at least one dose of MMR if you were born in 1957 or later. You may also need a second dose.  Pneumococcal 13-valent conjugate (PCV13) vaccine. You may need this if you have certain conditions and were not previously vaccinated.  Pneumococcal polysaccharide (PPSV23) vaccine. You may need one or two doses if you smoke cigarettes or if you have certain conditions.  Meningococcal vaccine.  You may need this if you have certain conditions.  Hepatitis A vaccine. You may need this if you have certain conditions or if you travel or work in places where you may be exposed to hepatitis A.  Hepatitis B vaccine. You may need this if you have certain conditions or if you travel or work in places where you may be exposed to hepatitis B.  Haemophilus influenzae type b (Hib) vaccine. You may need this if you have certain conditions.  Talk to your health care provider about which screenings and vaccines you need and how often you need them. This information is not intended to replace advice given to you by your health care provider. Make sure you discuss any questions you have with your health care provider. Document Released: 09/27/2015 Document Revised: 05/20/2016 Document Reviewed: 07/02/2015 Elsevier Interactive Patient Education  [employer].

## 2018-07-13 ENCOUNTER — Other Ambulatory Visit: Payer: Self-pay

## 2018-07-13 ENCOUNTER — Ambulatory Visit (HOSPITAL_COMMUNITY): Payer: BLUE CROSS/BLUE SHIELD | Attending: Cardiovascular Disease

## 2018-07-13 DIAGNOSIS — I059 Rheumatic mitral valve disease, unspecified: Secondary | ICD-10-CM | POA: Diagnosis not present

## 2018-07-18 ENCOUNTER — Encounter (HOSPITAL_COMMUNITY): Payer: Self-pay | Admitting: Obstetrics and Gynecology

## 2018-07-19 ENCOUNTER — Other Ambulatory Visit: Payer: Self-pay

## 2018-07-19 DIAGNOSIS — N858 Other specified noninflammatory disorders of uterus: Secondary | ICD-10-CM | POA: Diagnosis not present

## 2018-07-21 NOTE — H&P (Signed)
Jill Leonard is a 47 y.o. female 47 yo female, P: 1-0-0-1 who presents for hysterectomy because of a symptomatic large fibroid and menorrhagia.  Over the past year,  after losing weight,  the patient began to notice a lump in her abdomen, especially with reclining.  Over time she began to experience discomfort characterized as pressure,  fullness and a heaviness in the pelvic area.  She has also notices an urgency to to void, especially first thing  in the morning.  Her monthly menstrual flow,, with clots, will last for 7 days and require the change of a pad  every 2 hours.  She fortunately has menstrual cramping that  mild and reports intermenstrual bleeding, loose stools (has IBS) and nocturia.   Approximately 5 months ago she began Yasmin oral contraceptives for better cycle control and bleeding profile however she now has spotting daily that requires a pad change every 4 hours.   A pelvic ultrasound on September 2019 revealed an anteverted uterus (measures 9.8 cm form fundus to external os) with dimensions-9.88 x 8.26 x 6.43 cm, endometrium-2.28 cm with a fundal sub-serosal fibroid with an  intramural component, measuring 9.4 cm with some cystic degenerative areas close to the endometrium; left ovary-2.43 cm and right ovary-3.07 cm.  An endometrial biopsy done at the time of preop exam returned benign findings with no malignancy or hyperplasia.  A review of both medical and surgical management options were given to the patient regarding her symptoms and pathology however,  she has decided she wants definitive therapy in the form of hysterectomy.  Past Medical History  OB History: G:1;  P: 1-0-0-1;  SBV 2007 (infant weighed 8 lbs.)  GYN History: menarche: 47 YO    LMP: see HPI   Contracepton: Abstinence  Denies history of abnormal PAP smear.   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 Albuterol     REACTION: intolerant- "jittery" after use  . Other Other (See Comments)    Jittery and anxious and difficulty sleeping.  . Sulfonamide Derivatives     REACTION: intolerant  . Trazodone And Nefazodone Other (See Comments)    Jittery and anxious and difficulty sleeping.  . Latex Rash    Blisters    ROS: Admits to glasses, frequent sinus congestion, tinnitus  (right > left), joint swelling related to Sjogrens (feet, hip, knee) but  denies headache, vision changes,dysphagia, dizziness, hoarseness, cough,  chest pain, shortness of breath, nausea, vomiting, diarrhea,constipation,  urinary frequency,   dysuria, hematuria, vaginitis symptoms, easy bruising,  myalgias,  skin rashes, unexplained weight loss and except as is mentioned in the history of present illness, patient's review of systems is otherwise negative.     Physical Exam  Bp: 96/70   P: 94 bpm   R: 18  Temperature: 98.5 degrees F orally  Weight: 155 lbs.  Height: 5\' 7"   BMI: 24.3  Neck: supple without masses or thyromegaly Lungs: clear to auscultation Heart: regular rate and rhythm Abdomen: firm, non-tender mass from pelvis to 3 fingers above symphysis pubis and no organomegaly Pelvic:EGBUS- wnl; vagina-normal  rugae; uterus-12 week l size, cervix without lesions or motion tenderness; adnexae-no tenderness or masses Extremities:  no clubbing, cyanosis or edema   Assesment: Symptomatic Uterine Fibroid                      Menorrhagia   Disposition:   A discussion was held with patient regarding the indication for her procedure(s) along with the risks, which include but are not limited RC:VELFYBOF to anesthesia, damage to adjacent organs, Infection, excessive bleeding, formation of scar tissue, early menopause, pelvic prolapse and the possible need for an open abdominal incision.  Benefits of the robotic approach to hysterectomy was reviewed to include lesser postoperative pain, less blood loss during surgery, reduced risk of injury to other organs due to better visualization with a 3-D HD 10 times magnifying camera, shorter hospital stay between 0-1 night and rapid recovery with return to daily routine in 2-3 weeks. Although robotically-assisted hysterectomy has a longer operative time than traditional laparotomy, in a patient with good medical history, the benefits usually outweigh the risks.   Preservation or preventative removal of the ovaries was also reviewed and left to the patient's discretion. Finally, the option of supracervical hysterectomy was also discussed with the possible but yet unconfirmed benefits of reduction of pelvic prolapse. If supracervical hysterectomy is performed, Pap smear screening would continue as currently recommended, monthly bleeding is possible despite cauterization of cervical canal and a small but possible risk of cervical fibroid development is present   Patient informed about FDA warning on use of morcellator dated 12/29/2012. The discussion included:  1. Incidence of post-operative diagnosis of sarcoma in women undergoing a hysterectomy is 2:1000  2. Annual incidence of leiomyosarcoma is 0.64/100,000 women  3. Sarcomas have the highest incidence in women over  65  4. Power morcellation involves risks of spreading tissue / disease. In he case of undiagnosed cancer, it may adversely affect the patient's prognosis.  The patient was given the Miralax bowel prep to be completed 24 hours prior to procedure. She verbalized understanding of these risks and pre-operative instructions and has consented to proceed with a Robot Assisted Total Laparoscopic Hysterectomy, Bilateral Salpingectomy, Possible Morcellation at [facility] on August 02, 2018 at 7:30 a.m.   CSN# [id number]   [place] Lenhard J. Florene Glen, PA-C  for Dr.Sandra A. Rivard

## 2018-07-22 NOTE — Patient Instructions (Addendum)
EMMARIE SANNES  07/22/2018   Your procedure is scheduled on: 08-02-18  Report to [facility] Main  Entrance  Report to admitting at 530 AM    Call this number if you have problems the morning of surgery [phone]   Remember: Do not eat food or drink liquids :After Midnight. BRUSH YOUR TEETH MORNING OF SURGERY AND RINSE YOUR MOUTH OUT, NO CHEWING GUM CANDY OR MINTS.     Take these medicines the morning of surgery with A SIP OF WATER: RANITIDINE IF NEEDED                               You may not have any metal on your body including hair pins and              piercings  Do not wear jewelry, make-up, lotions, powders or perfumes, deodorant             Do not wear nail polish.  Do not shave  48 hours prior to surgery.              Men may shave face and neck.   Do not bring valuables to the hospital. [place].  Contacts, dentures or bridgework may not be worn into surgery.  Leave suitcase in the car. After surgery it may be brought to your room.                   Please read over the following fact sheets you were given: [place] _____________________________________________________________________             [facility] - Preparing for Surgery Before surgery, you can play an important role.  Because skin is not sterile, your skin needs to be as free of germs as possible.  You can reduce the number of germs on your skin by washing with CHG (chlorahexidine gluconate) soap before surgery.  CHG is an antiseptic cleaner which kills germs and bonds with the skin to continue killing germs even after washing. Please DO NOT use if you have an allergy to CHG or antibacterial soaps.  If your skin becomes reddened/irritated stop using the CHG and inform your nurse when you arrive at Short Stay. Do not shave (including legs and  underarms) for at least 48 hours prior to the first CHG shower.  You may shave your face/neck. Please follow these instructions carefully:  1.  Shower with CHG Soap the night before surgery and the  morning of Surgery.  2.  If you choose to wash your hair, wash your hair first as usual with your  normal  shampoo.  3.  After you shampoo, rinse your hair and body thoroughly to remove the  shampoo.                           4.  Use CHG as you would any other liquid soap.  You can apply chg directly  to the skin and wash                       Gently with a scrungie or clean washcloth.  5.  Apply the CHG Soap to your body ONLY FROM THE NECK DOWN.   Do not use on face/ open                           Wound or open sores. Avoid contact with eyes, ears mouth and genitals (private parts).                       Wash face,  Genitals (private parts) with your normal soap.             6.  Wash thoroughly, paying special attention to the area where your surgery  will be performed.  7.  Thoroughly rinse your body with warm water from the neck down.  8.  DO NOT shower/wash with your normal soap after using and rinsing off  the CHG Soap.                9.  Pat yourself dry with a clean towel.            10.  Wear clean pajamas.            11.  Place clean sheets on your bed the night of your first shower and do not  sleep with pets. Day of Surgery : Do not apply any lotions/deodorants the morning of surgery.  Please wear clean clothes to the hospital/surgery center.  FAILURE TO FOLLOW THESE INSTRUCTIONS MAY RESULT IN THE CANCELLATION OF YOUR SURGERY PATIENT SIGNATURE_________________________________  NURSE SIGNATURE__________________________________  ________________________________________________________________________

## 2018-07-26 NOTE — Progress Notes (Addendum)
EKG 01-12-18 Epic ECHO 07-13-18 Epic  LOV DR [facility] 07-13-17 EPIC

## 2018-07-28 ENCOUNTER — Encounter (HOSPITAL_COMMUNITY)
Admission: RE | Admit: 2018-07-28 | Discharge: 2018-07-28 | Disposition: A | Discharge status: Home / Self Care | Payer: BLUE CROSS/BLUE SHIELD | Source: Ambulatory Visit | Attending: Obstetrics and Gynecology | Admitting: Obstetrics and Gynecology

## 2018-07-28 ENCOUNTER — Encounter (HOSPITAL_COMMUNITY): Payer: Self-pay

## 2018-07-28 ENCOUNTER — Other Ambulatory Visit: Payer: Self-pay

## 2018-07-28 DIAGNOSIS — D259 Leiomyoma of uterus, unspecified: Secondary | ICD-10-CM | POA: Insufficient documentation

## 2018-07-28 DIAGNOSIS — N920 Excessive and frequent menstruation with regular cycle: Secondary | ICD-10-CM | POA: Diagnosis not present

## 2018-07-28 DIAGNOSIS — Z01812 Encounter for preprocedural laboratory examination: Secondary | ICD-10-CM | POA: Diagnosis not present

## 2018-07-28 HISTORY — DX: Other specified postprocedural states: R11.2

## 2018-07-28 HISTORY — DX: Other specified postprocedural states: Z98.890

## 2018-07-28 HISTORY — DX: Other complications of anesthesia, initial encounter: T88.59XA

## 2018-07-28 HISTORY — DX: Personal history of urinary calculi: Z87.442

## 2018-07-28 HISTORY — DX: Family history of other specified conditions: Z84.89

## 2018-07-28 HISTORY — DX: Cardiac arrhythmia, unspecified: I49.9

## 2018-07-28 HISTORY — DX: Adverse effect of unspecified anesthetic, initial encounter: T41.45XA

## 2018-07-28 LAB — CBC
HEMATOCRIT: 44.5 % (ref 36.0–46.0)
HEMOGLOBIN: 14.5 g/dL (ref 12.0–15.0)
MCH: 28.4 pg (ref 26.0–34.0)
MCHC: 32.6 g/dL (ref 30.0–36.0)
MCV: 87.3 fL (ref 80.0–100.0)
Platelets: 294 10*3/uL (ref 150–400)
RBC: 5.1 MIL/uL (ref 3.87–5.11)
RDW: 13.1 % (ref 11.5–15.5)
WBC: 4.1 10*3/uL (ref 4.0–10.5)
nRBC: 0 % (ref 0.0–0.2)

## 2018-07-28 LAB — BASIC METABOLIC PANEL
Anion gap: 7 (ref 5–15)
BUN: 9 mg/dL (ref 6–20)
CHLORIDE: 105 mmol/L (ref 98–111)
CO2: 26 mmol/L (ref 22–32)
Calcium: 8.8 mg/dL — ABNORMAL LOW (ref 8.9–10.3)
Creatinine, Ser: 0.95 mg/dL (ref 0.44–1.00)
GFR calc non Af Amer: >60 mL/min (ref >60)
Glucose, Bld: 85 mg/dL (ref 70–99)
Potassium: 4 mmol/L (ref 3.5–5.1)
SODIUM: 138 mmol/L (ref 135–145)

## 2018-08-01 NOTE — Anesthesia Preprocedure Evaluation (Addendum)
Anesthesia Evaluation  Patient identified by MRN, date of birth, ID band Patient awake    Reviewed: Allergy & Precautions, NPO status , Patient's Chart, lab work & pertinent test results  History of Anesthesia Complications (+) PONV and history of anesthetic complications  Airway Mallampati: II  TM Distance: >3 FB Neck ROM: Full    Dental no notable dental hx. (+) Teeth Intact, Dental Advisory Given,    Pulmonary neg pulmonary ROS,    Pulmonary exam normal breath sounds clear to auscultation       Cardiovascular Normal cardiovascular exam Rhythm:Regular Rate:Normal  TTE 06/2018: EF 65-70%, mild MVP  Takes metoprolol for "rapid heart rate and skipped beats" (?SVT)   Neuro/Psych  Headaches, Anxiety Depression    GI/Hepatic Neg liver ROS, GERD  Controlled,  Endo/Other  negative endocrine ROS  Renal/GU negative Renal ROS     Musculoskeletal  (+) Fibromyalgia -  Abdominal   Peds  Hematology negative hematology ROS (+)   Anesthesia Other Findings Day of surgery medications reviewed with the patient.  Reproductive/Obstetrics                           Anesthesia Physical Anesthesia Plan  ASA: II  Anesthesia Plan: General   Post-op Pain Management:    Induction: Intravenous  PONV Risk Score and Plan: 4 or greater and Ondansetron, Dexamethasone, Treatment may vary due to age or medical condition, Midazolam, Scopolamine patch - Pre-op and TIVA  Airway Management Planned: Oral ETT  Additional Equipment:   Intra-op Plan:   Post-operative Plan: Extubation in OR  Informed Consent: I have reviewed the patients History and Physical, chart, labs and discussed the procedure including the risks, benefits and alternatives for the proposed anesthesia with the patient or authorized representative who has indicated his/her understanding and acceptance.   Dental advisory given  Plan Discussed with:  CRNA and Anesthesiologist  Anesthesia Plan Comments: (TIVA (hx of severe PONV))      Anesthesia Quick Evaluation

## 2018-08-02 ENCOUNTER — Ambulatory Visit (HOSPITAL_BASED_OUTPATIENT_CLINIC_OR_DEPARTMENT_OTHER): Payer: BLUE CROSS/BLUE SHIELD | Admitting: Anesthesiology

## 2018-08-02 ENCOUNTER — Ambulatory Visit (HOSPITAL_COMMUNITY)
Admission: RE | Admit: 2018-08-02 | Discharge: 2018-08-02 | Disposition: A | Discharge status: Home / Self Care | Payer: BLUE CROSS/BLUE SHIELD | Source: Ambulatory Visit | Attending: Obstetrics and Gynecology | Admitting: Obstetrics and Gynecology

## 2018-08-02 ENCOUNTER — Encounter (HOSPITAL_COMMUNITY): Payer: Self-pay

## 2018-08-02 ENCOUNTER — Encounter (HOSPITAL_COMMUNITY)
Admission: RE | Disposition: A | Discharge status: Home / Self Care | Payer: Self-pay | Source: Ambulatory Visit | Attending: Obstetrics and Gynecology

## 2018-08-02 DIAGNOSIS — K219 Gastro-esophageal reflux disease without esophagitis: Secondary | ICD-10-CM | POA: Insufficient documentation

## 2018-08-02 DIAGNOSIS — Z79899 Other long term (current) drug therapy: Secondary | ICD-10-CM | POA: Diagnosis not present

## 2018-08-02 DIAGNOSIS — D251 Intramural leiomyoma of uterus: Secondary | ICD-10-CM

## 2018-08-02 DIAGNOSIS — Z7951 Long term (current) use of inhaled steroids: Secondary | ICD-10-CM | POA: Insufficient documentation

## 2018-08-02 DIAGNOSIS — M3500 Sicca syndrome, unspecified: Secondary | ICD-10-CM | POA: Insufficient documentation

## 2018-08-02 DIAGNOSIS — F418 Other specified anxiety disorders: Secondary | ICD-10-CM | POA: Diagnosis not present

## 2018-08-02 DIAGNOSIS — D252 Subserosal leiomyoma of uterus: Secondary | ICD-10-CM

## 2018-08-02 DIAGNOSIS — D259 Leiomyoma of uterus, unspecified: Secondary | ICD-10-CM | POA: Diagnosis not present

## 2018-08-02 LAB — PREGNANCY, URINE: Preg Test, Ur: NEGATIVE

## 2018-08-02 SURGERY — XI ROBOTIC ASSISTED TOTAL HYSTERECTOMY WITH SALPINGECTOMY
Anesthesia: General | Site: Abdomen

## 2018-08-02 MED ORDER — OXYCODONE HCL 5 MG PO TABS: 5.0000 mg | ORAL_TABLET | Freq: Once | ORAL | Status: DC | PRN

## 2018-08-02 MED ORDER — PROPOFOL 10 MG/ML IV BOLUS
INTRAVENOUS | Status: AC
  Filled 2018-08-02: qty 60

## 2018-08-02 MED ORDER — ACETAMINOPHEN 10 MG/ML IV SOLN: 1000.0000 mg | Freq: Once | INTRAVENOUS | Status: DC | PRN

## 2018-08-02 MED ORDER — PROPOFOL 500 MG/50ML IV EMUL
INTRAVENOUS | Status: AC
  Filled 2018-08-02: qty 200

## 2018-08-02 MED ORDER — KETOROLAC TROMETHAMINE 30 MG/ML IJ SOLN
INTRAMUSCULAR | Status: AC
  Filled 2018-08-02: qty 1

## 2018-08-02 MED ORDER — FENTANYL CITRATE (PF) 100 MCG/2ML IJ SOLN
INTRAMUSCULAR | Status: DC | PRN
  Administered 2018-08-02: 50 ug via INTRAVENOUS
  Administered 2018-08-02: 100 ug via INTRAVENOUS
  Administered 2018-08-02 (×2): 50 ug via INTRAVENOUS

## 2018-08-02 MED ORDER — SODIUM CHLORIDE (PF) 0.9 % IJ SOLN
INTRAMUSCULAR | Status: AC
  Filled 2018-08-02: qty 50

## 2018-08-02 MED ORDER — ONDANSETRON HCL 4 MG PO TABS
4.0000 mg | ORAL_TABLET | Freq: Three times a day (TID) | ORAL | Status: DC | PRN
  Administered 2018-08-02: 4 mg via ORAL
  Filled 2018-08-02: qty 1

## 2018-08-02 MED ORDER — FENTANYL CITRATE (PF) 100 MCG/2ML IJ SOLN: 25.0000 ug | INTRAMUSCULAR | Status: DC | PRN

## 2018-08-02 MED ORDER — SCOPOLAMINE 1 MG/3DAYS TD PT72
MEDICATED_PATCH | TRANSDERMAL | Status: AC
  Filled 2018-08-02: qty 1

## 2018-08-02 MED ORDER — LIDOCAINE 2% (20 MG/ML) 5 ML SYRINGE
INTRAMUSCULAR | Status: AC
  Filled 2018-08-02: qty 5

## 2018-08-02 MED ORDER — PROPOFOL 500 MG/50ML IV EMUL
INTRAVENOUS | Status: AC
  Filled 2018-08-02: qty 50

## 2018-08-02 MED ORDER — SODIUM CHLORIDE 0.9 % IR SOLN
Status: DC | PRN
  Administered 2018-08-02: 3000 mL

## 2018-08-02 MED ORDER — LIP MEDEX EX OINT
TOPICAL_OINTMENT | CUTANEOUS | Status: AC
  Filled 2018-08-02: qty 7

## 2018-08-02 MED ORDER — SODIUM CHLORIDE 0.9 % IV SOLN
2.0000 g | INTRAVENOUS | Status: AC
  Administered 2018-08-02: 2 g via INTRAVENOUS

## 2018-08-02 MED ORDER — OXYCODONE HCL 5 MG/5ML PO SOLN: 5.0000 mg | Freq: Once | ORAL | Status: DC | PRN

## 2018-08-02 MED ORDER — IBUPROFEN 400 MG PO TABS: 600.0000 mg | ORAL_TABLET | Freq: Four times a day (QID) | ORAL | Status: DC | PRN

## 2018-08-02 MED ORDER — STERILE WATER FOR IRRIGATION IR SOLN
Status: DC | PRN
  Administered 2018-08-02: 1000 mL

## 2018-08-02 MED ORDER — PROPOFOL 10 MG/ML IV BOLUS
INTRAVENOUS | Status: DC | PRN
  Administered 2018-08-02: 60 mg via INTRAVENOUS
  Administered 2018-08-02: 30 mg via INTRAVENOUS
  Administered 2018-08-02: 20 mg via INTRAVENOUS
  Administered 2018-08-02: 140 mg via INTRAVENOUS

## 2018-08-02 MED ORDER — MIDAZOLAM HCL 2 MG/2ML IJ SOLN
INTRAMUSCULAR | Status: DC | PRN
  Administered 2018-08-02: 1 mg via INTRAVENOUS
  Administered 2018-08-02: 2 mg via INTRAVENOUS
  Administered 2018-08-02: 1 mg via INTRAVENOUS

## 2018-08-02 MED ORDER — OXYCODONE-ACETAMINOPHEN 5-325 MG PO TABS: 1.0000 | ORAL_TABLET | Freq: Four times a day (QID) | ORAL | 0 refills | Status: DC | PRN

## 2018-08-02 MED ORDER — LACTATED RINGERS IV SOLN
INTRAVENOUS | Status: DC
  Administered 2018-08-02: 1000 mL via INTRAVENOUS
  Administered 2018-08-02: 10:00:00 via INTRAVENOUS

## 2018-08-02 MED ORDER — IBUPROFEN 600 MG PO TABS: ORAL_TABLET | ORAL | 1 refills | Status: DC

## 2018-08-02 MED ORDER — PROPOFOL 10 MG/ML IV BOLUS
INTRAVENOUS | Status: AC
  Filled 2018-08-02: qty 40

## 2018-08-02 MED ORDER — DEXAMETHASONE SODIUM PHOSPHATE 10 MG/ML IJ SOLN
INTRAMUSCULAR | Status: DC | PRN
  Administered 2018-08-02: 5 mg via INTRAVENOUS

## 2018-08-02 MED ORDER — ACETAMINOPHEN 10 MG/ML IV SOLN
INTRAVENOUS | Status: AC
  Filled 2018-08-02: qty 100

## 2018-08-02 MED ORDER — ROCURONIUM BROMIDE 100 MG/10ML IV SOLN
INTRAVENOUS | Status: AC
  Filled 2018-08-02: qty 1

## 2018-08-02 MED ORDER — ACETAMINOPHEN 10 MG/ML IV SOLN
INTRAVENOUS | Status: DC | PRN
  Administered 2018-08-02: 1000 mg via INTRAVENOUS

## 2018-08-02 MED ORDER — MIDAZOLAM HCL 2 MG/2ML IJ SOLN
INTRAMUSCULAR | Status: AC
  Filled 2018-08-02: qty 2

## 2018-08-02 MED ORDER — SODIUM CHLORIDE 0.9 % IV SOLN
INTRAVENOUS | Status: AC
  Filled 2018-08-02: qty 2

## 2018-08-02 MED ORDER — MENTHOL 3 MG MT LOZG: 1.0000 | LOZENGE | OROMUCOSAL | Status: DC | PRN

## 2018-08-02 MED ORDER — KETAMINE HCL 10 MG/ML IJ SOLN
INTRAMUSCULAR | Status: DC | PRN
  Administered 2018-08-02: 15 mg via INTRAVENOUS
  Administered 2018-08-02: 5 mg via INTRAVENOUS

## 2018-08-02 MED ORDER — ONDANSETRON HCL 4 MG/2ML IJ SOLN
INTRAMUSCULAR | Status: DC | PRN
  Administered 2018-08-02: 4 mg via INTRAVENOUS

## 2018-08-02 MED ORDER — SUGAMMADEX SODIUM 200 MG/2ML IV SOLN
INTRAVENOUS | Status: AC
  Filled 2018-08-02: qty 2

## 2018-08-02 MED ORDER — SODIUM CHLORIDE (PF) 0.9 % IJ SOLN
INTRAMUSCULAR | Status: AC
  Filled 2018-08-02: qty 10

## 2018-08-02 MED ORDER — ROPIVACAINE HCL 5 MG/ML IJ SOLN
INTRAMUSCULAR | Status: AC
  Filled 2018-08-02: qty 60

## 2018-08-02 MED ORDER — FENTANYL CITRATE (PF) 250 MCG/5ML IJ SOLN
INTRAMUSCULAR | Status: AC
  Filled 2018-08-02: qty 5

## 2018-08-02 MED ORDER — ARTIFICIAL TEARS OPHTHALMIC OINT
TOPICAL_OINTMENT | OPHTHALMIC | Status: AC
  Filled 2018-08-02: qty 3.5

## 2018-08-02 MED ORDER — ROCURONIUM BROMIDE 10 MG/ML (PF) SYRINGE
PREFILLED_SYRINGE | INTRAVENOUS | Status: DC | PRN
  Administered 2018-08-02: 20 mg via INTRAVENOUS
  Administered 2018-08-02: 90 mg via INTRAVENOUS

## 2018-08-02 MED ORDER — LACTATED RINGERS IV SOLN
INTRAVENOUS | Status: DC
  Administered 2018-08-02: 15:00:00 via INTRAVENOUS

## 2018-08-02 MED ORDER — SCOPOLAMINE 1 MG/3DAYS TD PT72
MEDICATED_PATCH | TRANSDERMAL | Status: DC | PRN
  Administered 2018-08-02: 1 via TRANSDERMAL

## 2018-08-02 MED ORDER — PROMETHAZINE HCL 25 MG/ML IJ SOLN: 6.2500 mg | INTRAMUSCULAR | Status: DC | PRN

## 2018-08-02 MED ORDER — PROPOFOL 500 MG/50ML IV EMUL
INTRAVENOUS | Status: DC | PRN
  Administered 2018-08-02: 200 ug/kg/min via INTRAVENOUS

## 2018-08-02 MED ORDER — KETAMINE HCL 10 MG/ML IJ SOLN
INTRAMUSCULAR | Status: AC
  Filled 2018-08-02: qty 1

## 2018-08-02 MED ORDER — OXYCODONE-ACETAMINOPHEN 5-325 MG PO TABS: 1.0000 | ORAL_TABLET | ORAL | Status: DC | PRN

## 2018-08-02 MED ORDER — SUGAMMADEX SODIUM 200 MG/2ML IV SOLN
INTRAVENOUS | Status: DC | PRN
  Administered 2018-08-02: 200 mg via INTRAVENOUS

## 2018-08-02 MED ORDER — ONDANSETRON HCL 4 MG/2ML IJ SOLN
INTRAMUSCULAR | Status: AC
  Filled 2018-08-02: qty 2

## 2018-08-02 MED ORDER — SODIUM CHLORIDE 0.9 % IV SOLN
INTRAVENOUS | Status: DC | PRN
  Administered 2018-08-02: 10 mL
  Administered 2018-08-02: 50 mL
  Administered 2018-08-02: 30 mL

## 2018-08-02 MED ORDER — IBUPROFEN 400 MG PO TABS: 600.0000 mg | ORAL_TABLET | Freq: Four times a day (QID) | ORAL | Status: DC

## 2018-08-02 MED ORDER — LIDOCAINE 2% (20 MG/ML) 5 ML SYRINGE
INTRAMUSCULAR | Status: DC | PRN
  Administered 2018-08-02: 40 mg via INTRAVENOUS

## 2018-08-02 MED ORDER — DEXAMETHASONE SODIUM PHOSPHATE 10 MG/ML IJ SOLN
INTRAMUSCULAR | Status: AC
  Filled 2018-08-02: qty 1

## 2018-08-02 SURGICAL SUPPLY — 66 items
ADH SKN CLS APL DERMABOND .7 (GAUZE/BANDAGES/DRESSINGS) ×1
BARRIER ADHS 3X4 INTERCEED (GAUZE/BANDAGES/DRESSINGS) ×3 IMPLANT
BLADE LAP MORCELLATOR 15MMX9.5 (ELECTROSURGICAL)
BLADE LAP MORCELLATOR 15X9.5 (ELECTROSURGICAL) IMPLANT
BRR ADH 4X3 ABS CNTRL BYND (GAUZE/BANDAGES/DRESSINGS) ×1
CANISTER SUCT 3000ML PPV (MISCELLANEOUS) ×3 IMPLANT
CATH FOLEY 3WAY  5CC 16FR (CATHETERS)
CATH FOLEY 3WAY 5CC 16FR (CATHETERS) IMPLANT
CLOSURE WOUND 1/4 X3 (GAUZE/BANDAGES/DRESSINGS) ×1
COVER BACK TABLE 60X90IN (DRAPES) ×3 IMPLANT
COVER TIP SHEARS 8 DVNC (MISCELLANEOUS) ×1 IMPLANT
COVER TIP SHEARS 8MM DA VINCI (MISCELLANEOUS) ×2
DECANTER SPIKE VIAL GLASS SM (MISCELLANEOUS) ×4 IMPLANT
DEFOGGER SCOPE WARMER CLEARIFY (MISCELLANEOUS) ×3 IMPLANT
DERMABOND ADVANCED (GAUZE/BANDAGES/DRESSINGS) ×2
DERMABOND ADVANCED .7 DNX12 (GAUZE/BANDAGES/DRESSINGS) ×1 IMPLANT
DRAPE ARM DVNC X/XI (DISPOSABLE) ×4 IMPLANT
DRAPE COLUMN DVNC XI (DISPOSABLE) ×1 IMPLANT
DRAPE DA VINCI XI ARM (DISPOSABLE) ×8
DRAPE DA VINCI XI COLUMN (DISPOSABLE) ×2
DURAPREP 26ML APPLICATOR (WOUND CARE) ×3 IMPLANT
ELECT REM PT RETURN 15FT ADLT (MISCELLANEOUS) ×3 IMPLANT
GLOVE BIO SURGEON STRL SZ7 (GLOVE) ×1 IMPLANT
GLOVE BIOGEL PI IND STRL 7.0 (GLOVE) ×5 IMPLANT
GLOVE BIOGEL PI INDICATOR 7.0 (GLOVE) ×10
GLOVE ECLIPSE 6.5 STRL STRAW (GLOVE) IMPLANT
GLOVE NEODERM STER SZ 7 (GLOVE) ×6 IMPLANT
GLOVE SKINSENSE N 8.5 STRL (GLOVE) ×6 IMPLANT
GLOVE SKINSENSE NS SZ6.5 (GLOVE) ×6
GLOVE SKINSENSE STRL SZ6.5 (GLOVE) IMPLANT
IRRIG SUCT STRYKERFLOW 2 WTIP (MISCELLANEOUS) ×3
IRRIGATION SUCT STRKRFLW 2 WTP (MISCELLANEOUS) ×1 IMPLANT
LEGGING LITHOTOMY PAIR STRL (DRAPES) IMPLANT
OBTURATOR OPTICAL STANDARD 8MM (TROCAR)
OBTURATOR OPTICAL STND 8 DVNC (TROCAR)
OBTURATOR OPTICALSTD 8 DVNC (TROCAR) IMPLANT
OCCLUDER COLPOPNEUMO (BALLOONS) ×3 IMPLANT
PACK ROBOT WH (CUSTOM PROCEDURE TRAY) ×3 IMPLANT
PACK ROBOTIC GOWN (GOWN DISPOSABLE) ×3 IMPLANT
PACK TRENDGUARD 450 HYBRID PRO (MISCELLANEOUS) IMPLANT
PAD PREP 24X48 CUFFED NSTRL (MISCELLANEOUS) ×3 IMPLANT
PROTECTOR NERVE ULNAR (MISCELLANEOUS) ×9 IMPLANT
SCISSORS LAP 5X45 EPIX DISP (ENDOMECHANICALS) ×2 IMPLANT
SEAL CANN UNIV 5-8 DVNC XI (MISCELLANEOUS) ×4 IMPLANT
SEAL XI 5MM-8MM UNIVERSAL (MISCELLANEOUS) ×8
SEALER VESSEL DA VINCI XI (MISCELLANEOUS) ×2
SEALER VESSEL EXT DVNC XI (MISCELLANEOUS) IMPLANT
SET CYSTO W/LG BORE CLAMP LF (SET/KITS/TRAYS/PACK) ×3 IMPLANT
SET TRI-LUMEN FLTR TB AIRSEAL (TUBING) ×3 IMPLANT
STRIP CLOSURE SKIN 1/4X3 (GAUZE/BANDAGES/DRESSINGS) ×2 IMPLANT
SUT MNCRL AB 3-0 PS2 27 (SUTURE) ×6 IMPLANT
SUT VIC AB 0 CT1 27 (SUTURE) ×6
SUT VIC AB 0 CT1 27XBRD ANBCTR (SUTURE) ×2 IMPLANT
SUT VICRYL 0 UR6 27IN ABS (SUTURE) ×3 IMPLANT
SUT VLOC 180 0 9IN  GS21 (SUTURE) ×4
SUT VLOC 180 0 9IN GS21 (SUTURE) ×2 IMPLANT
TIP RUMI ORANGE 6.7MMX12CM (TIP) IMPLANT
TIP UTERINE 5.1X6CM LAV DISP (MISCELLANEOUS) IMPLANT
TIP UTERINE 6.7X10CM GRN DISP (MISCELLANEOUS) IMPLANT
TIP UTERINE 6.7X6CM WHT DISP (MISCELLANEOUS) IMPLANT
TIP UTERINE 6.7X8CM BLUE DISP (MISCELLANEOUS) ×2 IMPLANT
TOWEL OR 17X26 10 PK STRL BLUE (TOWEL DISPOSABLE) ×3 IMPLANT
TRENDGUARD 450 HYBRID PRO PACK (MISCELLANEOUS) ×3
TROCAR PORT AIRSEAL 8X120 (TROCAR) ×3 IMPLANT
WATER STERILE IRR 1000ML POUR (IV SOLUTION) ×3 IMPLANT
catheter ×3 IMPLANT

## 2018-08-02 NOTE — Interval H&P Note (Signed)
History and Physical Interval Note:  08/02/2018 7:35 AM  Jill Leonard  has presented today for surgery, with the diagnosis of Uterine Fibroids, Menorrhagia  The various methods of treatment have been discussed with the patient and family. After consideration of risks, benefits and other options for treatment, the patient has consented to  Procedure(s) with comments: XI ROBOTIC ASSISTED TOTAL HYSTERECTOMY WITH SALPINGECTOMY with Possible Morcellation TIME ADDED FOR POWER MORCELLATION PLEASE DO NOT ADJUST TIME (N/A) - TIME ADDED FOR POWER MORCELLATION PLEASE DO NOT ADJUST TIME as a surgical intervention .  The patient's history has been reviewed, patient examined, no change in status, stable for surgery.  I have reviewed the patient's chart and labs.  Questions were answered to the patient's satisfaction.     [name]

## 2018-08-02 NOTE — Transfer of Care (Signed)
Immediate Anesthesia Transfer of Care Note  Patient: Jill Leonard  Procedure(s) Performed: XI ROBOTIC ASSISTED TOTAL HYSTERECTOMY WITH SALPINGECTOMY (N/A Abdomen)  Patient Location: PACU  Anesthesia Type:General  Level of Consciousness: awake, alert , oriented and patient cooperative  Airway & Oxygen Therapy: Patient Spontanous Breathing and Patient connected to face mask oxygen  Post-op Assessment: Report given to RN and Post -op Vital signs reviewed and stable  Post vital signs: Reviewed and stable  Last Vitals:  Vitals Value Taken Time  BP    Temp    Pulse 96 08/02/2018 11:42 AM  Resp 19 08/02/2018 11:42 AM  SpO2 100 % 08/02/2018 11:42 AM  Vitals shown include unvalidated device data.  Last Pain:  Vitals:   08/02/18 0556  TempSrc:   PainSc: 0-No pain      Patients Stated Pain Goal: 4 (74/16/38 4536)  Complications: No apparent anesthesia complications

## 2018-08-02 NOTE — Anesthesia Postprocedure Evaluation (Signed)
Anesthesia Post Note  Patient: Jill Leonard  Procedure(s) Performed: XI ROBOTIC ASSISTED TOTAL HYSTERECTOMY WITH SALPINGECTOMY (N/A Abdomen)     Patient location during evaluation: PACU Anesthesia Type: General Level of consciousness: awake and alert Pain management: pain level controlled Vital Signs Assessment: post-procedure vital signs reviewed and stable Respiratory status: spontaneous breathing, nonlabored ventilation and respiratory function stable Cardiovascular status: blood pressure returned to baseline and stable Postop Assessment: no apparent nausea or vomiting Anesthetic complications: no    Last Vitals:  Vitals:   08/02/18 0548 08/02/18 1145  BP: 125/85 115/75  Pulse: (!) 106 93  Resp: 16 16  Temp: 37.3 C (!) 36.4 C  SpO2: 99% 100%    Last Pain:  Vitals:   08/02/18 1145  TempSrc:   PainSc: [place]

## 2018-08-02 NOTE — Anesthesia Procedure Notes (Addendum)
Procedure Name: Intubation Date/Time: 08/02/2018 8:07 AM Performed by: Brennan Bailey, MD Pre-anesthesia Checklist: Patient identified, Emergency Drugs available, Suction available and Patient being monitored Patient Re-evaluated:Patient Re-evaluated prior to induction Oxygen Delivery Method: Circle System Utilized Preoxygenation: Pre-oxygenation with 100% oxygen Induction Type: IV induction, Rapid sequence and Cricoid Pressure applied Ventilation: Mask ventilation without difficulty Laryngoscope Size: 2 and Miller Grade View: Grade II Tube type: Oral Tube size: 7.0 mm Number of attempts: 3 Airway Equipment and Method: Stylet and Oral airway Placement Confirmation: ETT inserted through vocal cords under direct vision,  positive ETCO2,  breath sounds checked- equal and bilateral and CO2 detector Secured at: 22 cm Tube secured with: Tape Dental Injury: Teeth and Oropharynx as per pre-operative assessment  Difficulty Due To: Difficulty was unanticipated Comments: Planned RSI due to pt complaint of nausea upon entering OR. First attempt by CRNA with 2B view with Mac 3 blade, esophageal intubation, immediately recognized and ETT removed. Second view by CRNA with 2B view with MAC 3 blade. Pt's head repositioned and gentle mask ventilation performed. Next attempt by myself with Sabra Heck 2 blade, grade 2A view, atraumatic intubation with no evidence of reflux or aspiration. Daiva Huge, MD

## 2018-08-02 NOTE — Discharge Instructions (Signed)
Call [facility] @ [phone] if:  You have a temperature greater than or equal to 100.4 degrees Farenheit orally You have pain that is not made better by the pain medication given and taken as directed You have excessive bleeding or problems urinating  Take Colace (Docusate Sodium/Stool Softener) 100 mg 2-3 times daily while taking narcotic pain medicine to avoid constipation or until bowel movements are regular. Take Ibuprofen 600 mg with food every 6 hours for 5 days then as needed for pain  You may drive after 1 week You may walk up steps  You may shower tomorrow You may resume a regular diet  Keep incisions clean and dry Do not lift over 15 pounds for 6 weeks Avoid anything in vagina for 6 weeks (or until after your post-operative visit)

## 2018-08-02 NOTE — Op Note (Signed)
Preoperative diagnosis: uterine fibroids  Postoperative diagnosis: Same   Anesthesia: General  Anesthesiologist: Dr. Daiva Huge  Procedure: Robotically assisted total hysterectomy with bilateral salpingectomy  Surgeon: Dr. Katharine Look Auri Jahnke  Assistant: Earnstine Regal P.A.-C.  Estimated blood loss: 50 cc  Procedure:  After being informed of the planned procedure with possible complications including but not limited to bleeding, infection, injury to other organs, need for laparotomy, possible need for morcellation with risks and benefits reviewed, expected hospital stay and recovery, informed consent is obtained and patient is taken to or #6. She is placed in  lithotomy position on Trengard with both arms padded and tucked on each side and bilateral knee-high sequential compressive devices. She is given general anesthesia with endotracheal intubation without any complication. She is prepped and draped in a sterile fashion. A three-way Foley catheter is inserted in her bladder.  Pelvic exam reveals: enlarged uterus at 16 weeks size with a large dominant fundal fibroid and good mobility  A weighted speculum is inserted in the vagina and the anterior lip of the cervix is grasped with a tenaculum forcep. We proceed with a paracervical block and vaginal infiltration using ropivacaine 0.5% diluted 1 in 1 with saline. The uterus was then sounded at 10 cm. We easily dilate the cervix using Hegar dilator to  #27 which allows for easy placement of the intrauterine RUMI manipulator with a 3.5 KOH ring and a vaginal occluder. The ring is sutured to the cervix with 0 Vicryl.  Trocar placement is decided. We infiltrate 5 cm above the umbilicus with 10 cc of ropivacaine per protocol and perform a 10 mm vertical incision which is brought down bluntly to the fascia. The fascia is identified and grasped with Coker forceps. The fascia is incised with Mayo scissors. Peritoneum is entered bluntly. A pursestring suture of 0  Vicryl is placed on the fascia and a 10 mm Hassan trocar is easily inserted in the abdominal cavity held in placed with a Purstring suture. This allows for easy insufflation of a pneumoperitoneum using warmed CO2 at a maximum pressure of 15 mm of mercury. 60 cc of Ropivacaine 0.5 % diluted 1 in 1 is sent in the pelvis and the patient is positioned in reverse Trendelenburg. We then placed two 3mm robotic trocar on the left, one 64mm robotic trocar on the right and one 8 mm patient's side assistant trocar on the right  after infiltrating every site  with ropivacaine per protocol. The robot is docked on the left of the patient after positioning her in Trendelenburg. A Vessel Sealer is inserted in arm #4, a Long bipolar forceps is inserted in arm #2 and a Prograsp is inserted in arm #1.  Preparation and docking is completed in 49 minutes.  Observation: Uterus is enlarged by a dominant fundal intramural fibroid measuring 10 cm. Tubes and ovaries are normal. Liver is normal. Gallbladder is absent. Appendix is not seen. Anterior and posterior cul-de-sac are normal. There are a few adhesions between the colon and the right upper abdominal wall which are sharply dissected in order to place our last trocar safely.  We start on the left side by sealing and sectionning the mesosalpynx , the right utero-ovarian ligament and the right round ligament .  This gives [place] entry into the retroperitoneal space with an easy dissection of the anterior broad ligament. This was opened all the way to the right round ligament.   We then proceed with systematic dissection of the bladder over way from the anterior vaginal  cuff which is easily identified with the KOH ring. The plane of dissection is easily identified and confirmed after filling the bladder with 200 cc of saline. We are able to dissect the bladder 2 cm below the KOH ring. We then opened the posterior right broad ligament all the way to the posterior KOH ring after  identifying the full course of the right ureter.   Moving to the right side we seal and section the left round ligament , the left utero-ovarian ligament and  the mesosalpinx in between.Entry into the retroperitoneal space allows [place] to complete dissection of the bladder on the left side and skeletonized the all uterine vessels. The left broad ligament is then dissected all the way to the posterior KOH ring after identifying the full course of the left ureter.   With pressure on the KOH ring and the bladder fully dissected below we are able to seal ans section the uterine vessels on both sides at the level of the KOH ring.  The vaginal occluder is inflated and we proceed with a 360 colpotomy using an open monopolar scissors and freeing the uterus entirely with its tubes.  The uterus is delivered vaginally with manual morcellation. The vaginal occluder is reinserted in the vagina to maintain pneumoperitoneum.  Instruments are then modified for a suture cut in arm #4 and a long tip forcep in arm #2. We proceed with closure of the vaginal cuff with 2 running sutures of 0 V-Lock. We irrigated profusely with warm saline and confirm a satisfactory hemostasis as well as 2 normal ureters with good mobility and no dilatation.1 sheet of Interceed is placed on the vaginal cuff.  All instruments are then removed and the robot is undocked. Console time: 1 hours and 15 minutes. Vaginal morcellation required 17 minutes.  All trochars are removed under direct visualization after evacuating the pneumoperitoneum.  The fascia of the supraumbilical incision is closed with the previously placed pursestring suture of 0 Vicryl. All incisions are then closed with subcuticular suture of 3-0 Monocryl and Steri-Strips.  A speculum is inserted in the vagina to confirm a adequate closure of the vaginal cuff and good hemostasis.A small posterior vaginal laceration is closed with a running locked suture of 3-0  Vicryl.  Instrument and sponge count is complete x2. Estimated blood loss is minimal. The procedure is well tolerated by the patient is taken to recovery room in a well and stable condition.  Specimen: Uterus and tubes weighing 579 g sent to pathology

## 2018-08-02 NOTE — Progress Notes (Signed)
Phone Call to Dr. Cletis Media confirming ok to discharge home.  Patient wants to go home and has met all criteria.  Ok to discharged patient home.

## 2018-08-07 ENCOUNTER — Other Ambulatory Visit: Payer: Self-pay | Admitting: Cardiovascular Disease

## 2018-08-07 DIAGNOSIS — R002 Palpitations: Secondary | ICD-10-CM

## 2018-08-17 ENCOUNTER — Encounter (HOSPITAL_COMMUNITY): Payer: Self-pay | Admitting: *Deleted

## 2018-08-17 ENCOUNTER — Inpatient Hospital Stay (HOSPITAL_COMMUNITY)
Admission: AD | Admit: 2018-08-17 | Discharge: 2018-08-17 | Disposition: A | Discharge status: Home / Self Care | Payer: BLUE CROSS/BLUE SHIELD | Source: Ambulatory Visit | Attending: Obstetrics and Gynecology | Admitting: Obstetrics and Gynecology

## 2018-08-17 DIAGNOSIS — E860 Dehydration: Secondary | ICD-10-CM | POA: Diagnosis not present

## 2018-08-17 DIAGNOSIS — R55 Syncope and collapse: Secondary | ICD-10-CM | POA: Diagnosis not present

## 2018-08-17 LAB — COMPREHENSIVE METABOLIC PANEL
ALT: 45 U/L — ABNORMAL HIGH (ref 0–44)
AST: 63 U/L — ABNORMAL HIGH (ref 15–41)
Albumin: 4 g/dL (ref 3.5–5.0)
Alkaline Phosphatase: 77 U/L (ref 38–126)
Anion gap: 9 (ref 5–15)
BUN: 11 mg/dL (ref 6–20)
CHLORIDE: 102 mmol/L (ref 98–111)
CO2: 26 mmol/L (ref 22–32)
Calcium: 8.7 mg/dL — ABNORMAL LOW (ref 8.9–10.3)
Creatinine, Ser: 0.89 mg/dL (ref 0.44–1.00)
GFR calc Af Amer: >60 mL/min (ref >60)
GFR calc non Af Amer: >60 mL/min (ref >60)
Glucose, Bld: 85 mg/dL (ref 70–99)
POTASSIUM: 3.2 mmol/L — AB (ref 3.5–5.1)
Sodium: 137 mmol/L (ref 135–145)
Total Bilirubin: 0.4 mg/dL (ref 0.3–1.2)
Total Protein: 8.7 g/dL — ABNORMAL HIGH (ref 6.5–8.1)

## 2018-08-17 LAB — CBC WITH DIFFERENTIAL/PLATELET
Basophils Absolute: 0 10*3/uL (ref 0.0–0.1)
Basophils Relative: 0 %
Eosinophils Absolute: 0.1 10*3/uL (ref 0.0–0.5)
Eosinophils Relative: 3 %
HCT: 42.5 % (ref 36.0–46.0)
Hemoglobin: 14 g/dL (ref 12.0–15.0)
Lymphocytes Relative: 33 %
Lymphs Abs: 1.6 10*3/uL (ref 0.7–4.0)
MCH: 28.2 pg (ref 26.0–34.0)
MCHC: 32.9 g/dL (ref 30.0–36.0)
MCV: 85.5 fL (ref 80.0–100.0)
Monocytes Absolute: 0.3 10*3/uL (ref 0.1–1.0)
Monocytes Relative: 5 %
NEUTROS PCT: 59 %
Neutro Abs: 2.8 10*3/uL (ref 1.7–7.7)
Platelets: 346 10*3/uL (ref 150–400)
RBC: 4.97 MIL/uL (ref 3.87–5.11)
RDW: 12.9 % (ref 11.5–15.5)
WBC: 4.8 10*3/uL (ref 4.0–10.5)
nRBC: 0 % (ref 0.0–0.2)

## 2018-08-17 LAB — URINALYSIS, ROUTINE W REFLEX MICROSCOPIC
Bacteria, UA: NONE SEEN
Bilirubin Urine: NEGATIVE
Glucose, UA: NEGATIVE mg/dL
Ketones, ur: 20 mg/dL — AB
Nitrite: NEGATIVE
Protein, ur: NEGATIVE mg/dL
Specific Gravity, Urine: 1.019 (ref 1.005–1.030)
pH: 5 (ref 5.0–8.0)

## 2018-08-17 LAB — GLUCOSE, CAPILLARY: Glucose-Capillary: 85 mg/dL (ref 70–99)

## 2018-08-17 MED ORDER — LACTATED RINGERS IV SOLN: INTRAVENOUS | Status: DC

## 2018-08-17 MED ORDER — LACTATED RINGERS IV BOLUS
500.0000 mL | Freq: Once | INTRAVENOUS | Status: AC
  Administered 2018-08-17: 10:00:00 via INTRAVENOUS

## 2018-08-17 MED ORDER — LACTATED RINGERS IV BOLUS
250.0000 mL | Freq: Once | INTRAVENOUS | Status: AC
  Administered 2018-08-17: 11:00:00 via INTRAVENOUS

## 2018-08-17 NOTE — MAU Provider Note (Addendum)
History     CSN: [id number]  Arrival date and time: 08/17/18 6734   First Provider Initiated Contact with Patient 08/17/18 [phone]      Chief Complaint  Patient presents with  . Loss of Consciousness   Jill Leonard is a 47 y.o. G1P1 who is 15 days s/p robotic hysterectomy. She is here today after a syncopal episode x 2 this morning. She was in the shower and started to feel weak. Upon trying to get out she lost consciousness. She states that did not hit her head, and when she came to she was sitting. So she believes she was able to lower herself to the floor. She did scrape her side on the shower bar. Then when she was going back to her room she felt it happening again. Her partner witnessed this event, and caught her and brought her to the floor. He states that she was out for about 5 seconds.   Loss of Consciousness  This is a new problem. The current episode started today. She lost consciousness for a period of less than 1 minute. The symptoms are aggravated by normal activity. Pertinent negatives include no chest pain, fever, headaches, nausea or vomiting. She has tried nothing for the symptoms.    OB History    Gravida  1   Para  1   Term      Preterm      AB      Living  1     SAB      TAB      Ectopic      Multiple      Live Births              Past Medical History:  Diagnosis Date  . Anal fissure   . Arthralgia of temporomandibular joint    MIGRAINES AND REGULAR  . Calculus of gallbladder without mention of cholecystitis or obstruction   . Complication of anesthesia   . Depression   . Dysrhythmia    RAPID HEARTBEAT AND SKIPPED BEATS ON METOPROLOL  . Family history of adverse reaction to anesthesia    FAMILY MEMBERS HAVE PONV  . Fibromyalgia   . GERD (gastroesophageal reflux disease)   . Heart murmur    IN PAST, TOLD IS GONE NOW  . History of kidney stones   . History of nephrolithiasis   . HLD (hyperlipidemia)   . PONV (postoperative nausea  and vomiting)   . Sjogren - Larsson's syndrome   . Sjogren's syndrome [facility])     Past Surgical History:  Procedure Laterality Date  . ABDOMINAL HYSTERECTOMY    . CHOLECYSTECTOMY  08/20/08  . MANDIBLE SURGERY    . WISDOM TOOTH EXTRACTION      Family History  Problem Relation Age of Onset  . Colon polyps Father   . Bone cancer Unknown        Grandmother  . Diabetes Unknown        Grandmother  . Heart disease Unknown        Grandmother    Social History   Tobacco Use  . Smoking status: Never Smoker  . Smokeless tobacco: Never Used  Substance Use Topics  . Alcohol use: Yes    Alcohol/week: 0.0 standard drinks    Comment: Socially-rare  . Drug use: No    Allergies:  Allergies  Allergen Reactions  . Albuterol Other (See Comments)    REACTION: intolerant- "jittery" after use  . Sulfonamide Derivatives Other (See Comments)  REACTION: intolerant  . Trazodone And Nefazodone Other (See Comments)    Jittery and anxious and difficulty sleeping.  . Latex Rash and Other (See Comments)    Blisters    Medications Prior to Admission  Medication Sig Dispense Refill Last Dose  . Biotin 10 MG CAPS Take 10 mg by mouth daily.   08/01/2018 at Unknown time  . cholecalciferol (VITAMIN D) 1000 UNITS tablet Take 1,000 Units by mouth daily.     08/01/2018 at Unknown time  . fluticasone (FLONASE) 50 MCG/ACT nasal spray Place 2 sprays into both nostrils daily as needed for allergies or rhinitis.   07/19/2018  . ibuprofen (ADVIL,MOTRIN) 600 MG tablet 1  po  pc every 6 hours for 5 days then prn-post operative pain 30 tablet 1   . metoprolol succinate (TOPROL-XL) 25 MG 24 hr tablet TAKE ONE-HALF (1/2) TABLET DAILY 45 tablet 0   . oxyCODONE-acetaminophen (PERCOCET) 5-325 MG tablet Take 1 tablet by mouth every 6 (six) hours as needed for severe pain (post operative pain). 20 tablet 0   . pantoprazole (PROTONIX) 40 MG tablet TAKE 1 TABLET DAILY (Patient taking differently: Take 40 mg by mouth at  bedtime. ) 90 tablet 2 08/01/2018 at 2200  . Probiotic Product (PROBIOTIC DAILY PO) Take 1 capsule by mouth daily.    08/01/2018 at Unknown time  . ranitidine (ZANTAC) 150 MG tablet Take 150 mg by mouth 2 (two) times daily as needed for heartburn.    07/29/2018  . vitamin B-12 (CYANOCOBALAMIN) 500 MCG tablet Take 500 mcg by mouth daily.   08/01/2018 at Unknown time    Review of Systems  Constitutional: Negative for chills and fever.  Eyes: Negative for visual disturbance.  Respiratory: Negative for cough and chest tightness.   Cardiovascular: Positive for syncope. Negative for chest pain.  Gastrointestinal: Negative for nausea and vomiting.  Neurological: Positive for syncope. Negative for headaches.   Physical Exam   Blood pressure 127/67, pulse (!) 110, temperature 98.1 F (36.7 C), temperature source Oral, resp. rate 16, height 5\' 7"  (1.702 m), weight 68 kg, last menstrual period 07/03/2018, SpO2 99 %.  Physical Exam  Nursing note and vitals reviewed. Constitutional: She is oriented to person, place, and time. She appears well-developed and well-nourished. No distress.  Cardiovascular: Normal rate.  Respiratory: Effort normal.      GI: Soft.  Musculoskeletal: Normal range of motion.  Neurological: She is alert and oriented to person, place, and time.  Skin: Skin is warm and dry.  3 cm x 3 cm scrape under right shoulder blade.   Psychiatric: She has a normal mood and affect.   Results for orders placed or performed during the hospital encounter of 08/17/18 (from the past 24 hour(s))  Glucose, capillary     Status: None   Collection Time: 08/17/18  7:38 AM  Result Value Ref Range   Glucose-Capillary 85 70 - 99 mg/dL  Urinalysis, Routine w reflex microscopic     Status: Abnormal   Collection Time: 08/17/18  7:46 AM  Result Value Ref Range   Color, Urine YELLOW YELLOW   APPearance HAZY (A) CLEAR   Specific Gravity, Urine 1.019 1.005 - 1.030   pH 5.0 5.0 - 8.0   Glucose,  UA NEGATIVE NEGATIVE mg/dL   Hgb urine dipstick SMALL (A) NEGATIVE   Bilirubin Urine NEGATIVE NEGATIVE   Ketones, ur 20 (A) NEGATIVE mg/dL   Protein, ur NEGATIVE NEGATIVE mg/dL   Nitrite NEGATIVE NEGATIVE   Leukocytes, UA TRACE (  A) NEGATIVE   RBC / HPF 0-5 0 - 5 RBC/hpf   WBC, UA 6-10 0 - 5 WBC/hpf   Bacteria, UA NONE SEEN NONE SEEN   Squamous Epithelial / LPF 0-5 0 - 5   Mucus PRESENT    Hyaline Casts, UA PRESENT   CBC with Differential/Platelet     Status: None (Preliminary result)   Collection Time: 08/17/18  8:26 AM  Result Value Ref Range   WBC 4.8 4.0 - 10.5 K/uL   RBC 4.97 3.87 - 5.11 MIL/uL   Hemoglobin 14.0 12.0 - 15.0 g/dL   HCT 42.5 36.0 - 46.0 %   MCV 85.5 80.0 - 100.0 fL   MCH 28.2 26.0 - 34.0 pg   MCHC 32.9 30.0 - 36.0 g/dL   RDW 12.9 11.5 - 15.5 %   Platelets 346 150 - 400 K/uL   nRBC 0.0 0.0 - 0.2 %   Neutrophils Relative % 59 %   Neutro Abs 2.8 1.7 - 7.7 K/uL   Lymphocytes Relative 33 %   Lymphs Abs 1.6 0.7 - 4.0 K/uL   Monocytes Relative 5 %   Monocytes Absolute 0.3 0.1 - 1.0 K/uL   Eosinophils Relative 3 %   Eosinophils Absolute 0.1 0.0 - 0.5 K/uL   Basophils Relative 0 %   Basophils Absolute 0.0 0.0 - 0.1 K/uL   Other PENDING %    MAU Course  Procedures  MDM EKG Orthostatic VS CBC/CMET  8:51 AM constult with Dr. Gwenlyn Found with [facility]. He has reviewed the EKG. He reports that it is unchanged from EKG she had done in May 2019, and that it appears fine.   9:06 AM Dr. Cletis Media is on the unit to see the patient. She has assumed care of the patient.  Assessment and Plan  Syncope - Dr. Cletis Media plans to give IV fluids  Marcille Buffy 08/17/2018, 8:36 AM   Patient seen Reports family viral syndrome for > 1 week with loss of appetite, diaarhea, congestion Labs reviewed: normal CBC, glucose 89, normal EKG per cardio, CMP pending, U/A with 20 ketones  HEART: HR 100 bpm no murmur LUNGS: clear ABDOMEN: normal bowel sounds, non-tender, incisions  healing well PELVIC: healing vulvar laceration, vaginal cuff healing well with minimal spotting, normal non-tender bimanual exam  ASSESSMENT: Syncopal episode in patient who is 2 weeks post-op of a uncomplicated robotic hysterectomy with bilateral salpyngectomy likely due to dehydration in the context of ongoing viral syndrome.  IV hydration and reassess

## 2018-08-17 NOTE — Progress Notes (Signed)
Dr Cletis Media called MAU and stated pt to be dx with dehydration and discharge home, I put discharge order and f/u in computer for Dr Rivard, I did not see the pt.

## 2018-08-17 NOTE — MAU Note (Signed)
Pt reports she had hysterectomy 2 weeks ago, passed out x 2 this am. Continued spotting from surgery.

## 2018-09-20 ENCOUNTER — Ambulatory Visit: Payer: BLUE CROSS/BLUE SHIELD | Admitting: Cardiovascular Disease

## 2018-10-18 ENCOUNTER — Ambulatory Visit: Payer: BLUE CROSS/BLUE SHIELD | Admitting: Cardiovascular Disease

## 2018-10-18 ENCOUNTER — Encounter: Payer: Self-pay | Admitting: Cardiovascular Disease

## 2018-10-18 VITALS — BP 118/62 | HR 66 | Ht 67.0 in | Wt 154.2 lb

## 2018-10-18 DIAGNOSIS — I059 Rheumatic mitral valve disease, unspecified: Secondary | ICD-10-CM | POA: Diagnosis not present

## 2018-10-18 DIAGNOSIS — R002 Palpitations: Secondary | ICD-10-CM

## 2018-10-18 NOTE — Patient Instructions (Signed)
Medication Instructions:  No changes If you need a refill on your cardiac medications before your next appointment, please call your pharmacy.   Lab work: None ordered  Testing/Procedures: None ordered  Follow-Up: At [facility], you and your health needs are our priority.  As part of our continuing mission to provide you with exceptional heart care, we have created designated Provider Care Teams.  These Care Teams include your primary Cardiologist (physician) and Advanced Practice Providers (APPs -  Physician Assistants and Nurse Practitioners) who all work together to provide you with the care you need, when you need it. You will need a follow up appointment in 2 years.  Please call our office 2 months in advance to schedule this appointment.  You may see Dr. Fletcher Anon or one of the following Advanced Practice Providers on your designated Care Team:   Kerin Ransom, PA-C Roby Lofts, [place] . Sande Rives, PA-C

## 2018-10-18 NOTE — Progress Notes (Signed)
Cardiology Office Note   Date:  10/18/2018   ID:  MYRNA VONSEGGERN, DOB 01-Sep-1971, MRN [id number]  PCP:  Elby Beck, FNP  Cardiologist:   Kathlyn Sacramento, MD   Chief Complaint  Patient presents with  . Follow-up    pt denied chest pain      History of Present Illness: Jill Leonard is a 48 y.o. female who presents for a follow-up visit regarding palpitations and tachycardia. She has known history of Sjogren's syndrome.  Echocardiogram in January 2016 showed normal LV systolic function with an ejection fraction of 60% with borderline prolapse of anterior mitral valve leaflet with only mild mitral regurgitation and no evidence of pulmonary hypertension. A Holter monitor showed normal sinus rhythm with occasional PVCs and PACs. She had a total of 445 PVCs and 1200 PACs. A treadmill stress test in June of 2016 was normal. she was started on small dose Toprol with improvement in palpitations. She also cut down on caffeine intake.  She had a repeat echocardiogram in October 2019 which showed normal LV systolic function with mild mitral valve prolapse and trivial regurgitation.  She had an emergency room visit in May 2019 for atypical chest pain. She had hysterectomy done in November for fibroids.  She returned back with dehydration but she feels better now and she is back to baseline.  She tried to stop metoprolol last year but her palpitations worsen.  No chest pain or shortness of breath.  Past Medical History:  Diagnosis Date  . Anal fissure   . Arthralgia of temporomandibular joint    MIGRAINES AND REGULAR  . Calculus of gallbladder without mention of cholecystitis or obstruction   . Complication of anesthesia   . Depression   . Dysrhythmia    RAPID HEARTBEAT AND SKIPPED BEATS ON METOPROLOL  . Family history of adverse reaction to anesthesia    FAMILY MEMBERS HAVE PONV  . Fibromyalgia   . GERD (gastroesophageal reflux disease)   . Heart murmur    IN PAST, TOLD  IS GONE NOW  . History of kidney stones   . History of nephrolithiasis   . HLD (hyperlipidemia)   . PONV (postoperative nausea and vomiting)   . Sjogren - Larsson's syndrome   . Sjogren's syndrome [facility])     Past Surgical History:  Procedure Laterality Date  . ABDOMINAL HYSTERECTOMY    . CHOLECYSTECTOMY  08/20/08  . MANDIBLE SURGERY    . WISDOM TOOTH EXTRACTION       Current Outpatient Medications  Medication Sig Dispense Refill  . acetaminophen (TYLENOL) 500 MG tablet Take 500 mg by mouth every 6 (six) hours as needed for mild pain or headache.    Marland Kitchen aspirin-acetaminophen-caffeine (EXCEDRIN MIGRAINE) 250-250-65 MG tablet Take 1 tablet by mouth every 8 (eight) hours as needed for headache or migraine.    . Biotin 10 MG CAPS Take 10 mg by mouth every evening.     . cholecalciferol (VITAMIN D) 1000 UNITS tablet Take 1,000 Units by mouth every evening.     . diphenhydramine-acetaminophen (TYLENOL PM) 25-500 MG TABS tablet Take 1 tablet by mouth at bedtime as needed.    . fluticasone (FLONASE) 50 MCG/ACT nasal spray Place 2 sprays into both nostrils daily as needed for allergies or rhinitis.    Marland Kitchen ibuprofen (ADVIL,MOTRIN) 600 MG tablet 1  po  pc every 6 hours for 5 days then prn-post operative pain (Patient taking differently: Take 600 mg by mouth every 6 (six) hours  as needed. post operative pain) 30 tablet 1  . metoprolol succinate (TOPROL-XL) 25 MG 24 hr tablet TAKE ONE-HALF (1/2) TABLET DAILY (Patient taking differently: Take 12.5 mg by mouth every evening. ) 45 tablet 0  . pantoprazole (PROTONIX) 40 MG tablet TAKE 1 TABLET DAILY (Patient taking differently: Take 40 mg by mouth at bedtime. ) 90 tablet 2  . Probiotic Product (PROBIOTIC DAILY PO) Take 1 capsule by mouth every evening.     . ranitidine (ZANTAC) 150 MG tablet Take 150 mg by mouth 2 (two) times daily as needed for heartburn.     . vitamin B-12 (CYANOCOBALAMIN) 500 MCG tablet Take 500 mcg by mouth every evening.      No  current facility-administered medications for this visit.     Allergies:   Albuterol; Sulfonamide derivatives; Trazodone and nefazodone; and Latex    Social History:  The patient  reports that she has never smoked. She has never used smokeless tobacco. She reports current alcohol use. She reports that she does not use drugs.   Family History:  The patient's family history includes Bone cancer in her unknown relative; Colon polyps in her father; Diabetes in her unknown relative; Heart disease in her unknown relative.    ROS:  Please see the history of present illness.   Otherwise, review of systems are positive for none.   All other systems are reviewed and negative.    PHYSICAL EXAM: VS:  BP 118/62   Pulse 66   Ht 5\' 7"  (1.702 m)   Wt 154 lb 3.2 oz (69.9 kg)   LMP 07/03/2018   BMI 24.15 kg/m  , BMI Body mass index is 24.15 kg/m. GEN: Well nourished, well developed, in no acute distress  HEENT: normal  Neck: no JVD, carotid bruits, or masses Cardiac: RRR; no murmurs, rubs, or gallops,no edema  Respiratory:  clear to auscultation bilaterally, normal work of breathing GI: soft, nontender, nondistended, + BS MS: no deformity or atrophy  Skin: warm and dry, no rash Neuro:  Strength and sensation are intact Psych: euthymic mood, full affect   EKG:  EKG is ordered today. EKG showed normal sinus rhythm with no significant ST or T wave changes.   Recent Labs: 08/17/2018: ALT 45; BUN 11; Creatinine, Ser 0.89; Hemoglobin 14.0; Platelets 346; Potassium 3.2; Sodium 137    Lipid Panel    Component Value Date/Time   CHOL 200 06/10/2018 0837   TRIG 152.0 (H) 06/10/2018 0837   HDL 48.20 06/10/2018 0837   CHOLHDL 4 06/10/2018 0837   VLDL 30.4 06/10/2018 0837   LDLCALC 121 (H) 06/10/2018 0837      Wt Readings from Last 3 Encounters:  10/18/18 154 lb 3.2 oz (69.9 kg)  08/17/18 150 lb (68 kg)  08/02/18 150 lb (68 kg)       No flowsheet data found.    ASSESSMENT AND  PLAN:  1.  Palpitations due to PVCs and PACs: Overall well controlled with small dose of Toprol.  2.  Borderline mitral valve prolapse with mild mitral regurgitation.  No murmur by physical exam.  Echocardiogram last year showed only trivial regurgitation.   Disposition:   FU with me in 2 year  Signed,  Kathlyn Sacramento, MD  10/18/2018 10:11 AM    Jill Leonard

## 2018-11-09 ENCOUNTER — Other Ambulatory Visit: Payer: Self-pay | Admitting: Cardiovascular Disease

## 2018-11-09 DIAGNOSIS — R002 Palpitations: Secondary | ICD-10-CM

## 2018-11-09 NOTE — Telephone Encounter (Signed)
Please review for refill, Thanks !  

## 2018-11-18 DIAGNOSIS — N939 Abnormal uterine and vaginal bleeding, unspecified: Secondary | ICD-10-CM | POA: Diagnosis not present

## 2018-11-25 ENCOUNTER — Other Ambulatory Visit: Payer: Self-pay | Admitting: Family Medicine

## 2019-02-27 DIAGNOSIS — R109 Unspecified abdominal pain: Secondary | ICD-10-CM | POA: Diagnosis not present

## 2019-02-27 DIAGNOSIS — Z6827 Body mass index (BMI) 27.0-27.9, adult: Secondary | ICD-10-CM | POA: Diagnosis not present

## 2019-02-27 DIAGNOSIS — Z1239 Encounter for other screening for malignant neoplasm of breast: Secondary | ICD-10-CM | POA: Diagnosis not present

## 2019-02-27 DIAGNOSIS — Z01419 Encounter for gynecological examination (general) (routine) without abnormal findings: Secondary | ICD-10-CM | POA: Diagnosis not present

## 2019-03-22 ENCOUNTER — Other Ambulatory Visit: Payer: Self-pay | Admitting: Obstetrics and Gynecology

## 2019-04-21 ENCOUNTER — Other Ambulatory Visit: Payer: Self-pay | Admitting: Obstetrics and Gynecology

## 2019-04-21 DIAGNOSIS — R109 Unspecified abdominal pain: Secondary | ICD-10-CM

## 2019-05-04 ENCOUNTER — Other Ambulatory Visit: Payer: Self-pay

## 2019-05-04 ENCOUNTER — Ambulatory Visit
Admission: RE | Admit: 2019-05-04 | Discharge: 2019-05-04 | Disposition: A | Discharge status: Home / Self Care | Payer: BC Managed Care – PPO | Source: Ambulatory Visit | Attending: Obstetrics and Gynecology | Admitting: Obstetrics and Gynecology

## 2019-05-04 DIAGNOSIS — R109 Unspecified abdominal pain: Secondary | ICD-10-CM

## 2019-05-04 DIAGNOSIS — N200 Calculus of kidney: Secondary | ICD-10-CM | POA: Diagnosis not present

## 2019-05-04 MED ORDER — IOPAMIDOL (ISOVUE-300) INJECTION 61%
100.0000 mL | Freq: Once | INTRAVENOUS | Status: AC | PRN
  Administered 2019-05-04: 100 mL via INTRAVENOUS

## 2019-05-25 ENCOUNTER — Ambulatory Visit: Payer: Self-pay | Admitting: General Surgery

## 2019-05-25 DIAGNOSIS — K439 Ventral hernia without obstruction or gangrene: Secondary | ICD-10-CM | POA: Diagnosis not present

## 2019-05-29 DIAGNOSIS — Z1231 Encounter for screening mammogram for malignant neoplasm of breast: Secondary | ICD-10-CM | POA: Diagnosis not present

## 2019-05-29 LAB — HM MAMMOGRAPHY

## 2019-06-02 ENCOUNTER — Encounter: Payer: Self-pay | Admitting: Family Medicine

## 2019-06-07 ENCOUNTER — Encounter (HOSPITAL_COMMUNITY): Payer: Self-pay

## 2019-06-07 NOTE — Progress Notes (Signed)
[id number] [place], [place] [address] [place] [address] Phone: [phone] Fax: [phone]  [facility] [place], [place] [address] [place] [address] Phone: [phone] Fax: [phone]      Your procedure is scheduled on June 12, 2019.  Report to [facility] Main Entrance "A" at 1:00 pm., and check in at the Admitting office.  Call this number if you have problems the morning of surgery:  [phone]  Call [phone] if you have any questions prior to your surgery date Monday-Friday 8am-4pm    Remember:  Do not eat or drink after midnight the night before your surgery    Take these medicines the morning of surgery with A SIP OF WATER: metoprolol succinate (TOPROL-XL) pantoprazole (PROTONIX)  fluticasone (FLONASE)- if needed   As of today, STOP taking any Aspirin (unless otherwise instructed by your surgeon), Aleve, Naproxen, Ibuprofen, Motrin, Advil, Goody's, BC's, all herbal medications, fish oil, and all vitamins.    The Morning of Surgery  Do not wear jewelry, make-up or nail polish.  Do not wear lotions, powders, or perfumes, or deodorant  Do not shave 48 hours prior to surgery.  .  Do not bring valuables to the hospital.  [facility] is not responsible for any belongings or valuables.  If you are a smoker, DO NOT Smoke 24 hours prior to surgery IF you wear a CPAP at night please bring your mask, tubing, and machine the morning of surgery   Remember that you must have someone to transport you home after your surgery, and remain with you for 24 hours if you are discharged the same day.   Contacts, glasses, hearing aids, dentures or bridgework may not be worn into surgery.    Leave your suitcase in the car.  After surgery it may be brought to your room.  For patients admitted to the hospital,  discharge time will be determined by your treatment team.  Patients discharged the day of surgery will not be allowed to drive home.    Special instructions:   [place]- Preparing For Surgery  Before surgery, you can play an important role. Because skin is not sterile, your skin needs to be as free of germs as possible. You can reduce the number of germs on your skin by washing with CHG (chlorahexidine gluconate) Soap before surgery.  CHG is an antiseptic cleaner which kills germs and bonds with the skin to continue killing germs even after washing.    Oral Hygiene is also important to reduce your risk of infection.  Remember - BRUSH YOUR TEETH THE MORNING OF SURGERY WITH YOUR REGULAR TOOTHPASTE  Please do not use if you have an allergy to CHG or antibacterial soaps. If your skin becomes reddened/irritated stop using the CHG.  Do not shave (including legs and underarms) for at least 48 hours prior to first CHG shower. It is OK to shave your face.  Please follow these instructions carefully.   1. Shower the NIGHT BEFORE SURGERY and the MORNING OF SURGERY with CHG Soap.   2. If you chose to wash your hair, wash your hair first as usual with your normal shampoo.  3. After you shampoo, rinse your hair and body thoroughly to remove the shampoo.  4. Use CHG as you would any other liquid soap. You can apply CHG directly to  the skin and wash gently with a scrungie or a clean washcloth.   5. Apply the CHG Soap to your body ONLY FROM THE NECK DOWN.  Do not use on open wounds or open sores. Avoid contact with your eyes, ears, mouth and genitals (private parts). Wash Face and genitals (private parts)  with your normal soap.   6. Wash thoroughly, paying special attention to the area where your surgery will be performed.  7. Thoroughly rinse your body with warm water from the neck down.  8. DO NOT shower/wash with your normal soap after using and rinsing off the CHG Soap.  9. Pat yourself dry  with a CLEAN TOWEL.  10. Wear CLEAN PAJAMAS to bed the night before surgery, wear comfortable clothes the morning of surgery  11. Place CLEAN SHEETS on your bed the night of your first shower and DO NOT SLEEP WITH PETS.    Day of Surgery:  Do not apply any deodorants/lotions. Please shower the morning of surgery with the CHG soap  Please wear clean clothes to the hospital/surgery center.   Remember to brush your teeth WITH YOUR REGULAR TOOTHPASTE.   Please read over the following fact sheets that you were given.

## 2019-06-08 ENCOUNTER — Encounter (HOSPITAL_COMMUNITY): Payer: Self-pay

## 2019-06-08 ENCOUNTER — Encounter (HOSPITAL_COMMUNITY)
Admission: RE | Admit: 2019-06-08 | Discharge: 2019-06-08 | Disposition: A | Discharge status: Home / Self Care | Payer: BC Managed Care – PPO | Source: Ambulatory Visit | Attending: General Surgery | Admitting: General Surgery

## 2019-06-08 ENCOUNTER — Other Ambulatory Visit (HOSPITAL_COMMUNITY)
Admission: RE | Admit: 2019-06-08 | Discharge: 2019-06-08 | Disposition: A | Discharge status: Home / Self Care | Payer: BC Managed Care – PPO | Source: Ambulatory Visit | Attending: General Surgery | Admitting: General Surgery

## 2019-06-08 ENCOUNTER — Other Ambulatory Visit: Payer: Self-pay

## 2019-06-08 DIAGNOSIS — Z01812 Encounter for preprocedural laboratory examination: Secondary | ICD-10-CM | POA: Diagnosis not present

## 2019-06-08 DIAGNOSIS — K439 Ventral hernia without obstruction or gangrene: Secondary | ICD-10-CM | POA: Insufficient documentation

## 2019-06-08 DIAGNOSIS — Z20828 Contact with and (suspected) exposure to other viral communicable diseases: Secondary | ICD-10-CM | POA: Insufficient documentation

## 2019-06-08 LAB — CBC
HCT: 46.3 % — ABNORMAL HIGH (ref 36.0–46.0)
Hemoglobin: 14.9 g/dL (ref 12.0–15.0)
MCH: 28.1 pg (ref 26.0–34.0)
MCHC: 32.2 g/dL (ref 30.0–36.0)
MCV: 87.2 fL (ref 80.0–100.0)
Platelets: 266 10*3/uL (ref 150–400)
RBC: 5.31 MIL/uL — ABNORMAL HIGH (ref 3.87–5.11)
RDW: 13.6 % (ref 11.5–15.5)
WBC: 4.1 10*3/uL (ref 4.0–10.5)
nRBC: 0 % (ref 0.0–0.2)

## 2019-06-08 LAB — BASIC METABOLIC PANEL
Anion gap: 9 (ref 5–15)
BUN: 7 mg/dL (ref 6–20)
CO2: 27 mmol/L (ref 22–32)
Calcium: 9.4 mg/dL (ref 8.9–10.3)
Chloride: 106 mmol/L (ref 98–111)
Creatinine, Ser: 0.9 mg/dL (ref 0.44–1.00)
GFR calc Af Amer: >60 mL/min (ref >60)
GFR calc non Af Amer: >60 mL/min (ref >60)
Glucose, Bld: 94 mg/dL (ref 70–99)
Potassium: 4.4 mmol/L (ref 3.5–5.1)
Sodium: 142 mmol/L (ref 135–145)

## 2019-06-08 NOTE — Progress Notes (Signed)
  Coronavirus Screening Scheduled for COVID test today Have you experienced the following symptoms:  Cough yes/no: No Fever (>100.59F)  yes/no: No Runny nose yes/no: No Sore throat yes/no: No Difficulty breathing/shortness of breath  yes/no: No Loss of sense of smell or taste-No Have you or a family member traveled in the last 14 days and where? yes/no: No  PCP - Clarene Reamer, FNP  Cardiologist - Dr Kathlyn Sacramento  Chest x-ray - NA  EKG - 10-18-18  Stress Test - 03-05-15  ECHO - 07-13-18  Cardiac Cath - denies  AICD-denies PM-denies LOOP-denies  Sleep Study - denies CPAP -NA   LABS-CBC,BMP  ASA-denies  ERAS-NA  HA1C-denies Fasting Blood Sugar -  Checks Blood Sugar _____ times a day  Anesthesia-Y. H/o dysrrythmia. On Metoprolol.  Pt denies having chest pain, palpitations,dizziness, sob, or fever at this time.Has had h/o palpitations that pt states Metoprolol has it under control .Has cut down on her caffeine intake but is overly concerned about having severe headaches if she does not have her coffee in the morning. RN reinforced instructions about staying NPO before her procedure. All instructions explained to the pt, with a verbal understanding of the material. Pt agrees to go over the instructions while at home for a better understanding. Pt also instructed to self quarantine after being tested for COVID-19. The opportunity to ask questions was provided.

## 2019-06-09 LAB — NOVEL CORONAVIRUS, NAA (HOSP ORDER, SEND-OUT TO REF LAB; TAT 18-24 HRS): SARS-CoV-2, NAA: NOT DETECTED

## 2019-06-09 NOTE — Anesthesia Preprocedure Evaluation (Addendum)
Anesthesia Evaluation  Patient identified by MRN, date of birth, ID band Patient awake    Reviewed: Allergy & Precautions, NPO status , Patient's Chart, lab work & pertinent test results  History of Anesthesia Complications (+) PONV  Airway Mallampati: II  TM Distance: >3 FB Neck ROM: Full    Dental no notable dental hx.    Pulmonary neg pulmonary ROS,    Pulmonary exam normal breath sounds clear to auscultation       Cardiovascular negative cardio ROS Normal cardiovascular exam Rhythm:Regular Rate:Normal     Neuro/Psych  Headaches, Anxiety Depression negative psych ROS   GI/Hepatic Neg liver ROS, GERD  ,  Endo/Other  negative endocrine ROS  Renal/GU negative Renal ROS  negative genitourinary   Musculoskeletal  (+) Fibromyalgia -  Abdominal   Peds negative pediatric ROS (+)  Hematology negative hematology ROS (+)   Anesthesia Other Findings   Reproductive/Obstetrics negative OB ROS                             Anesthesia Physical Anesthesia Plan  ASA: II  Anesthesia Plan: General   Post-op Pain Management:    Induction: Intravenous  PONV Risk Score and Plan: 4 or greater and Ondansetron, Dexamethasone, Midazolam, Scopolamine patch - Pre-op and Treatment may vary due to age or medical condition  Airway Management Planned: Oral ETT  Additional Equipment:   Intra-op Plan:   Post-operative Plan: Extubation in OR  Informed Consent: I have reviewed the patients History and Physical, chart, labs and discussed the procedure including the risks, benefits and alternatives for the proposed anesthesia with the patient or authorized representative who has indicated his/her understanding and acceptance.     Dental advisory given  Plan Discussed with: CRNA  Anesthesia Plan Comments: (Follows with cardiology for MVP and palpitations, well managed on metoprolol. Last seen by Dr. Fletcher Anon  10/18/18, doing well at that time, recommended f/u in 2 years.  TTE 07/13/18: - Left ventricle: The cavity size was normal. Wall thickness was   normal. Systolic function was vigorous. The estimated ejection   fraction was in the range of 65% to 70%. Left ventricular   diastolic function parameters were normal. - Mitral valve: Mild bi leflet prolapse and thickening. - Atrial septum: No defect or patent foramen ovale was identified.   )       Anesthesia Quick Evaluation

## 2019-06-12 ENCOUNTER — Ambulatory Visit (HOSPITAL_COMMUNITY): Payer: BC Managed Care – PPO | Admitting: Physician Assistant

## 2019-06-12 ENCOUNTER — Ambulatory Visit (HOSPITAL_COMMUNITY): Payer: BC Managed Care – PPO

## 2019-06-12 ENCOUNTER — Other Ambulatory Visit: Payer: Self-pay

## 2019-06-12 ENCOUNTER — Encounter (HOSPITAL_COMMUNITY): Payer: Self-pay | Admitting: General Practice

## 2019-06-12 ENCOUNTER — Encounter (HOSPITAL_COMMUNITY)
Admission: RE | Disposition: A | Discharge status: Home / Self Care | Payer: Self-pay | Source: Home / Self Care | Attending: General Surgery

## 2019-06-12 ENCOUNTER — Ambulatory Visit (HOSPITAL_COMMUNITY)
Admission: RE | Admit: 2019-06-12 | Discharge: 2019-06-12 | Disposition: A | Discharge status: Home / Self Care | Payer: BC Managed Care – PPO | Attending: General Surgery | Admitting: General Surgery

## 2019-06-12 DIAGNOSIS — Z882 Allergy status to sulfonamides status: Secondary | ICD-10-CM | POA: Diagnosis not present

## 2019-06-12 DIAGNOSIS — K439 Ventral hernia without obstruction or gangrene: Secondary | ICD-10-CM | POA: Insufficient documentation

## 2019-06-12 DIAGNOSIS — Z79899 Other long term (current) drug therapy: Secondary | ICD-10-CM | POA: Diagnosis not present

## 2019-06-12 DIAGNOSIS — M797 Fibromyalgia: Secondary | ICD-10-CM | POA: Insufficient documentation

## 2019-06-12 DIAGNOSIS — F418 Other specified anxiety disorders: Secondary | ICD-10-CM | POA: Diagnosis not present

## 2019-06-12 DIAGNOSIS — M3500 Sicca syndrome, unspecified: Secondary | ICD-10-CM | POA: Insufficient documentation

## 2019-06-12 DIAGNOSIS — Z90710 Acquired absence of both cervix and uterus: Secondary | ICD-10-CM | POA: Insufficient documentation

## 2019-06-12 DIAGNOSIS — K219 Gastro-esophageal reflux disease without esophagitis: Secondary | ICD-10-CM | POA: Diagnosis not present

## 2019-06-12 HISTORY — PX: VENTRAL HERNIA REPAIR: SHX424

## 2019-06-12 SURGERY — REPAIR, HERNIA, VENTRAL
Anesthesia: General

## 2019-06-12 MED ORDER — ONDANSETRON HCL 4 MG PO TABS: 4.0000 mg | ORAL_TABLET | Freq: Every day | ORAL | 0 refills | Status: DC | PRN

## 2019-06-12 MED ORDER — MIDAZOLAM HCL 2 MG/2ML IJ SOLN
INTRAMUSCULAR | Status: AC
  Filled 2019-06-12: qty 2

## 2019-06-12 MED ORDER — LIDOCAINE 2% (20 MG/ML) 5 ML SYRINGE
INTRAMUSCULAR | Status: AC
  Filled 2019-06-12: qty 15

## 2019-06-12 MED ORDER — BUPIVACAINE-EPINEPHRINE 0.25% -1:200000 IJ SOLN
INTRAMUSCULAR | Status: DC | PRN
  Administered 2019-06-12: 8 mL

## 2019-06-12 MED ORDER — ROCURONIUM BROMIDE 10 MG/ML (PF) SYRINGE
PREFILLED_SYRINGE | INTRAVENOUS | Status: DC | PRN
  Administered 2019-06-12: 60 mg via INTRAVENOUS

## 2019-06-12 MED ORDER — GABAPENTIN 300 MG PO CAPS
300.0000 mg | ORAL_CAPSULE | ORAL | Status: AC
  Administered 2019-06-12: 12:00:00 300 mg via ORAL

## 2019-06-12 MED ORDER — ONDANSETRON HCL 4 MG/2ML IJ SOLN
INTRAMUSCULAR | Status: AC
  Filled 2019-06-12: qty 2

## 2019-06-12 MED ORDER — CEFAZOLIN SODIUM-DEXTROSE 2-4 GM/100ML-% IV SOLN
2.0000 g | INTRAVENOUS | Status: AC
  Administered 2019-06-12: 2 g via INTRAVENOUS

## 2019-06-12 MED ORDER — EPHEDRINE 5 MG/ML INJ
INTRAVENOUS | Status: AC
  Filled 2019-06-12: qty 20

## 2019-06-12 MED ORDER — ONDANSETRON HCL 4 MG/2ML IJ SOLN
INTRAMUSCULAR | Status: AC
  Filled 2019-06-12: qty 6

## 2019-06-12 MED ORDER — FENTANYL CITRATE (PF) 250 MCG/5ML IJ SOLN
INTRAMUSCULAR | Status: DC | PRN
  Administered 2019-06-12 (×2): 50 ug via INTRAVENOUS

## 2019-06-12 MED ORDER — ONDANSETRON HCL 4 MG/2ML IJ SOLN
4.0000 mg | Freq: Once | INTRAMUSCULAR | Status: AC
  Administered 2019-06-12: 4 mg via INTRAVENOUS

## 2019-06-12 MED ORDER — CHLORHEXIDINE GLUCONATE CLOTH 2 % EX PADS: 6.0000 | MEDICATED_PAD | Freq: Once | CUTANEOUS | Status: DC

## 2019-06-12 MED ORDER — PROMETHAZINE HCL 25 MG/ML IJ SOLN
INTRAMUSCULAR | Status: AC
  Filled 2019-06-12: qty 1

## 2019-06-12 MED ORDER — PROPOFOL 10 MG/ML IV BOLUS
INTRAVENOUS | Status: AC
  Filled 2019-06-12: qty 20

## 2019-06-12 MED ORDER — HYDROMORPHONE HCL 1 MG/ML IJ SOLN: 0.2500 mg | INTRAMUSCULAR | Status: DC | PRN

## 2019-06-12 MED ORDER — CEFAZOLIN SODIUM-DEXTROSE 2-4 GM/100ML-% IV SOLN
INTRAVENOUS | Status: AC
  Filled 2019-06-12: qty 100

## 2019-06-12 MED ORDER — ARTIFICIAL TEARS OPHTHALMIC OINT
TOPICAL_OINTMENT | OPHTHALMIC | Status: AC
  Filled 2019-06-12: qty 3.5

## 2019-06-12 MED ORDER — MIDAZOLAM HCL 5 MG/5ML IJ SOLN
INTRAMUSCULAR | Status: DC | PRN
  Administered 2019-06-12: 2 mg via INTRAVENOUS

## 2019-06-12 MED ORDER — BUPIVACAINE-EPINEPHRINE 0.25% -1:200000 IJ SOLN
INTRAMUSCULAR | Status: AC
  Filled 2019-06-12: qty 1

## 2019-06-12 MED ORDER — LIDOCAINE 2% (20 MG/ML) 5 ML SYRINGE
INTRAMUSCULAR | Status: DC | PRN
  Administered 2019-06-12: 60 mg via INTRAVENOUS

## 2019-06-12 MED ORDER — ONDANSETRON HCL 4 MG/2ML IJ SOLN
INTRAMUSCULAR | Status: DC | PRN
  Administered 2019-06-12: 4 mg via INTRAVENOUS

## 2019-06-12 MED ORDER — GABAPENTIN 300 MG PO CAPS
ORAL_CAPSULE | ORAL | Status: AC
  Administered 2019-06-12: 12:00:00 300 mg via ORAL
  Filled 2019-06-12: qty 1

## 2019-06-12 MED ORDER — PROMETHAZINE HCL 12.5 MG PO TABS: 12.5000 mg | ORAL_TABLET | Freq: Four times a day (QID) | ORAL | 0 refills | Status: DC | PRN

## 2019-06-12 MED ORDER — SUCCINYLCHOLINE CHLORIDE 200 MG/10ML IV SOSY
PREFILLED_SYRINGE | INTRAVENOUS | Status: AC
  Filled 2019-06-12: qty 30

## 2019-06-12 MED ORDER — 0.9 % SODIUM CHLORIDE (POUR BTL) OPTIME
TOPICAL | Status: DC | PRN
  Administered 2019-06-12: 1000 mL

## 2019-06-12 MED ORDER — OXYCODONE HCL 5 MG PO TABS
ORAL_TABLET | ORAL | Status: AC
  Filled 2019-06-12: qty 1

## 2019-06-12 MED ORDER — SCOPOLAMINE 1 MG/3DAYS TD PT72
MEDICATED_PATCH | TRANSDERMAL | Status: AC
  Filled 2019-06-12: qty 1

## 2019-06-12 MED ORDER — SUGAMMADEX SODIUM 500 MG/5ML IV SOLN
INTRAVENOUS | Status: DC | PRN
  Administered 2019-06-12: 150 mg via INTRAVENOUS

## 2019-06-12 MED ORDER — OXYCODONE HCL 5 MG/5ML PO SOLN: 5.0000 mg | Freq: Once | ORAL | Status: AC | PRN

## 2019-06-12 MED ORDER — ALBUMIN HUMAN 5 % IV SOLN
12.5000 g | Freq: Once | INTRAVENOUS | Status: AC
  Administered 2019-06-12: 12.5 g via INTRAVENOUS

## 2019-06-12 MED ORDER — ROCURONIUM BROMIDE 10 MG/ML (PF) SYRINGE
PREFILLED_SYRINGE | INTRAVENOUS | Status: AC
  Filled 2019-06-12: qty 30

## 2019-06-12 MED ORDER — OXYCODONE HCL 5 MG PO TABS
5.0000 mg | ORAL_TABLET | Freq: Once | ORAL | Status: AC | PRN
  Administered 2019-06-12: 5 mg via ORAL

## 2019-06-12 MED ORDER — PROMETHAZINE HCL 25 MG/ML IJ SOLN
6.2500 mg | INTRAMUSCULAR | Status: DC | PRN
  Administered 2019-06-12: 6.25 mg via INTRAVENOUS

## 2019-06-12 MED ORDER — LACTATED RINGERS IV SOLN
INTRAVENOUS | Status: DC
  Administered 2019-06-12: 12:00:00 via INTRAVENOUS

## 2019-06-12 MED ORDER — MEPERIDINE HCL 25 MG/ML IJ SOLN: 6.2500 mg | INTRAMUSCULAR | Status: DC | PRN

## 2019-06-12 MED ORDER — PROPOFOL 10 MG/ML IV BOLUS
INTRAVENOUS | Status: DC | PRN
  Administered 2019-06-12: 150 mg via INTRAVENOUS

## 2019-06-12 MED ORDER — ACETAMINOPHEN 500 MG PO TABS
1000.0000 mg | ORAL_TABLET | ORAL | Status: AC
  Administered 2019-06-12: 12:00:00 1000 mg via ORAL

## 2019-06-12 MED ORDER — ALBUMIN HUMAN 5 % IV SOLN
INTRAVENOUS | Status: AC
  Filled 2019-06-12: qty 250

## 2019-06-12 MED ORDER — PHENYLEPHRINE 40 MCG/ML (10ML) SYRINGE FOR IV PUSH (FOR BLOOD PRESSURE SUPPORT)
PREFILLED_SYRINGE | INTRAVENOUS | Status: DC | PRN
  Administered 2019-06-12 (×2): 60 ug via INTRAVENOUS
  Administered 2019-06-12: 80 ug via INTRAVENOUS

## 2019-06-12 MED ORDER — DEXAMETHASONE SODIUM PHOSPHATE 10 MG/ML IJ SOLN
INTRAMUSCULAR | Status: AC
  Filled 2019-06-12: qty 3

## 2019-06-12 MED ORDER — HYDROCODONE-ACETAMINOPHEN 5-325 MG PO TABS: 1.0000 | ORAL_TABLET | Freq: Four times a day (QID) | ORAL | 0 refills | Status: DC | PRN

## 2019-06-12 MED ORDER — ACETAMINOPHEN 500 MG PO TABS
ORAL_TABLET | ORAL | Status: AC
  Administered 2019-06-12: 1000 mg via ORAL
  Filled 2019-06-12: qty 2

## 2019-06-12 MED ORDER — DROPERIDOL 2.5 MG/ML IJ SOLN
0.6250 mg | Freq: Once | INTRAMUSCULAR | Status: AC
  Administered 2019-06-12: 13:00:00 0.625 mg via INTRAVENOUS
  Filled 2019-06-12: qty 2

## 2019-06-12 MED ORDER — DEXAMETHASONE SODIUM PHOSPHATE 4 MG/ML IJ SOLN
INTRAMUSCULAR | Status: DC | PRN
  Administered 2019-06-12: 10 mg via INTRAVENOUS

## 2019-06-12 MED ORDER — FENTANYL CITRATE (PF) 250 MCG/5ML IJ SOLN
INTRAMUSCULAR | Status: AC
  Filled 2019-06-12: qty 5

## 2019-06-12 MED ORDER — SCOPOLAMINE 1 MG/3DAYS TD PT72
1.0000 | MEDICATED_PATCH | TRANSDERMAL | Status: DC
  Administered 2019-06-12: 1.5 mg via TRANSDERMAL

## 2019-06-12 SURGICAL SUPPLY — 39 items
ADH SKN CLS APL DERMABOND .7 (GAUZE/BANDAGES/DRESSINGS) ×1
APL PRP STRL LF DISP 70% ISPRP (MISCELLANEOUS) ×1
BINDER ABDOMINAL 12 ML 46-62 (SOFTGOODS) ×2 IMPLANT
BLADE CLIPPER SURG (BLADE) IMPLANT
CANISTER SUCT 3000ML PPV (MISCELLANEOUS) ×3 IMPLANT
CHLORAPREP W/TINT 26 (MISCELLANEOUS) ×3 IMPLANT
COVER SURGICAL LIGHT HANDLE (MISCELLANEOUS) ×3 IMPLANT
COVER WAND RF STERILE (DRAPES) ×1 IMPLANT
DERMABOND ADVANCED (GAUZE/BANDAGES/DRESSINGS) ×2
DERMABOND ADVANCED .7 DNX12 (GAUZE/BANDAGES/DRESSINGS) IMPLANT
DRAPE LAPAROSCOPIC ABDOMINAL (DRAPES) ×3 IMPLANT
ELECT REM PT RETURN 9FT ADLT (ELECTROSURGICAL) ×3
ELECTRODE REM PT RTRN 9FT ADLT (ELECTROSURGICAL) ×1 IMPLANT
GAUZE SPONGE 4X4 12PLY STRL LF (GAUZE/BANDAGES/DRESSINGS) IMPLANT
GLOVE BIO SURGEON STRL SZ7.5 (GLOVE) ×3 IMPLANT
GOWN STRL REUS W/ TWL LRG LVL3 (GOWN DISPOSABLE) ×2 IMPLANT
GOWN STRL REUS W/TWL LRG LVL3 (GOWN DISPOSABLE) ×6
KIT BASIN OR (CUSTOM PROCEDURE TRAY) ×3 IMPLANT
KIT TURNOVER KIT B (KITS) ×3 IMPLANT
MESH VENTRALEX ST 8CM LRG (Mesh General) ×3 IMPLANT
NDL HYPO 25GX1X1/2 BEV (NEEDLE) ×1 IMPLANT
NEEDLE HYPO 25GX1X1/2 BEV (NEEDLE) ×3 IMPLANT
NS IRRIG 1000ML POUR BTL (IV SOLUTION) ×3 IMPLANT
PACK GENERAL/GYN (CUSTOM PROCEDURE TRAY) ×3 IMPLANT
PAD ARMBOARD 7.5X6 YLW CONV (MISCELLANEOUS) ×3 IMPLANT
PENCIL SMOKE EVACUATOR (MISCELLANEOUS) ×3 IMPLANT
STAPLER VISISTAT 35W (STAPLE) IMPLANT
SUT MNCRL AB 4-0 PS2 18 (SUTURE) ×2 IMPLANT
SUT NOVA NAB DX-16 0-1 5-0 T12 (SUTURE) ×6 IMPLANT
SUT SILK 2 0 (SUTURE) ×3
SUT SILK 2-0 18XBRD TIE 12 (SUTURE) ×1 IMPLANT
SUT VIC AB 2-0 SH 18 (SUTURE) IMPLANT
SUT VIC AB 2-0 SH 27 (SUTURE) ×3
SUT VIC AB 2-0 SH 27XBRD (SUTURE) IMPLANT
SYR CONTROL 10ML LL (SYRINGE) ×3 IMPLANT
TOWEL GREEN STERILE (TOWEL DISPOSABLE) ×3 IMPLANT
TOWEL GREEN STERILE FF (TOWEL DISPOSABLE) ×3 IMPLANT
TRAY FOLEY W/BAG SLVR 16FR (SET/KITS/TRAYS/PACK)
TRAY FOLEY W/BAG SLVR 16FR ST (SET/KITS/TRAYS/PACK) IMPLANT

## 2019-06-12 NOTE — Anesthesia Procedure Notes (Signed)
Procedure Name: Intubation Date/Time: 06/12/2019 2:45 PM Performed by: Myna Bright, CRNA Pre-anesthesia Checklist: Patient identified, Emergency Drugs available, Suction available, Patient being monitored and Timeout performed Patient Re-evaluated:Patient Re-evaluated prior to induction Oxygen Delivery Method: Circle system utilized Preoxygenation: Pre-oxygenation with 100% oxygen Induction Type: IV induction Ventilation: Mask ventilation without difficulty Laryngoscope Size: Mac and 3 Grade View: Grade II Tube type: Oral Airway Equipment and Method: Stylet Placement Confirmation: ETT inserted through vocal cords under direct vision,  positive ETCO2 and CO2 detector Secured at: 22 cm Tube secured with: Tape Dental Injury: Teeth and Oropharynx as per pre-operative assessment

## 2019-06-12 NOTE — Op Note (Signed)
06/12/2019  2:02 PM  PATIENT:  Jill Leonard  48 y.o. female  PRE-OPERATIVE DIAGNOSIS:  VENTRAL HERNIA  POST-OPERATIVE DIAGNOSIS:  VENTRAL HERNIA  PROCEDURE:  Procedure(s): VENTRAL HERNIA REPAIR WITH MESH (N/A)  SURGEON:  Surgeon(s) and Role:    * Jovita Kussmaul, MD - Primary  PHYSICIAN ASSISTANT:   ASSISTANTS: none   ANESTHESIA:   local and general  EBL:  minimal   BLOOD ADMINISTERED:none  DRAINS: none   LOCAL MEDICATIONS USED:  MARCAINE     SPECIMEN:  No Specimen  DISPOSITION OF SPECIMEN:  N/A  COUNTS:  YES  TOURNIQUET:  * No tourniquets in log *  DICTATION: .Dragon Dictation   After informed consent was obtained the patient was brought to the operating room and placed in the supine position on the operating table.  After adequate induction of general anesthesia the patient's abdomen was prepped with ChloraPrep, allowed to dry, and draped in usual sterile manner.  An appropriate timeout was performed.  The small hernia was palpable in the epigastric region.  This area was infiltrated with quarter percent Marcaine.  A small vertically oriented incision was made overlying the palpable defect.  The incision was carried through the skin and subcutaneous tissue sharply with the electrocautery until the hernia sac was encountered.  The hernia sac was reduced.  The hernia seem to be involving some of the falciform ligament.  This was all reduced and the fascial edges cleaned out by blunt finger dissection and some sharp dissection with the electrocautery.  Next a 8 cm piece of umbilical hernia repair mesh was chosen.  The mesh was placed through the fascial defect and up against the inside of the abdominal wall.  The mesh was oriented with the coated side towards the abdominal cavity.  The anchors were trimmed.  The fascial defect was then closed with interrupted #1 Novafil stitches incorporating the anchors of the mesh.  Once this was accomplished the hernia seem well repaired  and the mesh seem to be in good position up against the inner abdominal wall.  The wound was irrigated with saline.  The subcutaneous tissue was closed with interrupted 2-0 Vicryl stitches.  The skin was then closed with a running 4-0 Monocryl subcuticular stitch.  Dermabond dressings were applied.  The patient tolerated the procedure well.  At the end of the case all needle sponge and instrument counts were correct.  The patient was then awakened and taken to recovery in stable condition.  PLAN OF CARE: Discharge to home after PACU  PATIENT DISPOSITION:  PACU - hemodynamically stable.   Delay start of Pharmacological VTE agent (>24hrs) due to surgical blood loss or risk of bleeding: not applicable

## 2019-06-12 NOTE — H&P (Signed)
Jill Leonard  Location: [facility] Patient #: [id number] DOB: May 14, 1971 Married / Language: English / Race: White Female   History of Present Illness  The patient is a 48 year old female who presents with abdominal pain. We are asked to see the patient in consultation by Dr. Clarene Reamer to evaluate her for a ventral hernia. The patient is a 48 year old white female who has a history of robotic hysterectomy performed last November. Since that time she has been having pain in her epigastric area. She has also noticed a bulge in the same location which corresponds to a port site. She denies any nausea or vomiting. She does have underlying generalized pain secondary to Sjogren's disease. She does not smoke. She had a recent CT scan that showed a 3 cm fascial defect above the umbilicus with some fat in it but no involvement of bowel   Past Surgical History  Gallbladder Surgery - Laparoscopic  Hysterectomy (not due to cancer) - Partial  Oral Surgery   Diagnostic Studies History  Colonoscopy  never Mammogram  within last year Pap Smear  1-5 years ago  Allergies  Sulfa Drugs    Medication History  Metoprolol Succinate ER (25MG  Tablet ER 24HR, Oral) Active. Vitamin D3 (25 MCG(1000 UT) Capsule, Oral) Active. Vitamin C (1000MG  Tablet, Oral) Active. Vitamin B-12 (1000MCG Tablet, Oral) Active. Medications Reconciled  Social History  Alcohol use  Occasional alcohol use. Caffeine use  Carbonated beverages, Tea. No drug use  Tobacco use  Never smoker.  Family History  Arthritis  Mother. Depression  Father. Hypertension  Mother.  Pregnancy / Birth History  Age at menarche  43 years. Contraceptive History  Oral contraceptives. Gravida  1 Maternal age  63-35 Para  1  Other Problems  Anxiety Disorder  Cholelithiasis  Gastroesophageal Reflux Disease  Hemorrhoids  Hypercholesterolemia  Inguinal Hernia  Kidney Stone   Migraine Headache     Review of Systems General Not Present- Appetite Loss, Chills, Fatigue, Fever, Night Sweats, Weight Gain and Weight Loss. Skin Not Present- Change in Wart/Mole, Dryness, Hives, Jaundice, New Lesions, Non-Healing Wounds, Rash and Ulcer. HEENT Present- Hearing Loss, Ringing in the Ears, Seasonal Allergies and Wears glasses/contact lenses. Not Present- Earache, Hoarseness, Nose Bleed, Oral Ulcers, Sinus Pain, Sore Throat, Visual Disturbances and Yellow Eyes. Respiratory Not Present- Bloody sputum, Chronic Cough, Difficulty Breathing, Snoring and Wheezing. Breast Not Present- Breast Mass, Breast Pain, Nipple Discharge and Skin Changes. Cardiovascular Not Present- Chest Pain, Difficulty Breathing Lying Down, Leg Cramps, Palpitations, Rapid Heart Rate, Shortness of Breath and Swelling of Extremities. Gastrointestinal Present- Hemorrhoids. Not Present- Abdominal Pain, Bloating, Bloody Stool, Change in Bowel Habits, Chronic diarrhea, Constipation, Difficulty Swallowing, Excessive gas, Gets full quickly at meals, Indigestion, Nausea, Rectal Pain and Vomiting. Female Genitourinary Not Present- Frequency, Nocturia, Painful Urination, Pelvic Pain and Urgency. Musculoskeletal Present- Joint Pain, Joint Stiffness and Muscle Pain. Not Present- Back Pain, Muscle Weakness and Swelling of Extremities. Neurological Present- Headaches. Not Present- Decreased Memory, Fainting, Numbness, Seizures, Tingling, Tremor, Trouble walking and Weakness. Psychiatric Not Present- Anxiety, Bipolar, Change in Sleep Pattern, Depression, Fearful and Frequent crying. Endocrine Not Present- Cold Intolerance, Excessive Hunger, Hair Changes, Heat Intolerance, Hot flashes and New Diabetes. Hematology Not Present- Blood Thinners, Easy Bruising, Excessive bleeding, Gland problems, HIV and Persistent Infections.  Vitals  Weight: 158.5 lb Height: 67in Body Surface Area: 1.83 m Body Mass Index: 24.82 kg/m   Temp.: 98.93F (Oral)  Pulse: 104 (Regular)  BP: 110/72(Sitting, Left Arm, Standard)  Physical Exam  General Mental Status-Alert. General Appearance-Consistent with stated age. Hydration-Well hydrated. Voice-Normal.  Head and Neck Head-normocephalic, atraumatic with no lesions or palpable masses. Trachea-midline. Thyroid Gland Characteristics - normal size and consistency.  Eye Eyeball - Bilateral-Extraocular movements intact. Sclera/Conjunctiva - Bilateral-No scleral icterus.  Chest and Lung Exam Chest and lung exam reveals -quiet, even and easy respiratory effort with no use of accessory muscles and on auscultation, normal breath sounds, no adventitious sounds and normal vocal resonance. Inspection Chest Wall - Normal. Back - normal.  Cardiovascular Cardiovascular examination reveals -normal heart sounds, regular rate and rhythm with no murmurs and normal pedal pulses bilaterally.  Abdomen Note: The abdomen is soft with mild epigastric tenderness. There is a palpable fascial defect measuring about 3 cm in diameter. Its reduces easily. This corresponds to a port site from previous surgery.   Neurologic Neurologic evaluation reveals -alert and oriented x 3 with no impairment of recent or remote memory. Mental Status-Normal.  Musculoskeletal Normal Exam - Left-Upper Extremity Strength Normal and Lower Extremity Strength Normal. Normal Exam - Right-Upper Extremity Strength Normal and Lower Extremity Strength Normal.  Lymphatic Head & Neck  General Head & Neck Lymphatics: Bilateral - Description - Normal. Axillary  General Axillary Region: Bilateral - Description - Normal. Tenderness - Non Tender. Femoral & Inguinal  Generalized Femoral & Inguinal Lymphatics: Bilateral - Description - Normal. Tenderness - Non Tender.    Assessment & Plan  VENTRAL HERNIA WITHOUT OBSTRUCTION OR GANGRENE (K43.9) Impression: The patient  appears to have a small ventral hernia likely related to her previous robotic surgery. Because of the risk of incarceration and strangulation and think she would benefit from having this fixed. She would also like to have this done. I have discussed with her in detail the risks and benefits of the operation as well as some of the technical aspects including the use of mesh and the risk of chronic pain and she understands and wishes to proceed. I will Plan for an open ventral hernia repair with mesh  Current Plans Pt Education - Abdominal Wall Hernias: discussed with patient and provided information.

## 2019-06-12 NOTE — Interval H&P Note (Signed)
History and Physical Interval Note:  06/12/2019 12:27 PM  Jill Leonard  has presented today for surgery, with the diagnosis of VENTRAL HERNIA.  The various methods of treatment have been discussed with the patient and family. After consideration of risks, benefits and other options for treatment, the patient has consented to  Procedure(s): VENTRAL HERNIA REPAIR WITH MESH (N/A) as a surgical intervention.  The patient's history has been reviewed, patient examined, no change in status, stable for surgery.  I have reviewed the patient's chart and labs.  Questions were answered to the patient's satisfaction.     Autumn Messing III

## 2019-06-12 NOTE — Transfer of Care (Signed)
Immediate Anesthesia Transfer of Care Note  Patient: Jill Leonard  Procedure(s) Performed: VENTRAL HERNIA REPAIR WITH MESH (N/A )  Patient Location: PACU  Anesthesia Type:General  Level of Consciousness: awake, alert , oriented and patient cooperative  Airway & Oxygen Therapy: Patient Spontanous Breathing and Patient connected to face mask oxygen  Post-op Assessment: Report given to RN, Post -op Vital signs reviewed and stable and Patient moving all extremities  Post vital signs: Reviewed and stable  Last Vitals:  Vitals Value Taken Time  BP 103/70 06/12/19 1410  Temp    Pulse    Resp 20 06/12/19 1411  SpO2    Vitals shown include unvalidated device data.  Last Pain:  Vitals:   06/12/19 1210  TempSrc:   PainSc: 2       Patients Stated Pain Goal: 3 (45/40/98 1191)  Complications: No apparent anesthesia complications

## 2019-06-13 ENCOUNTER — Encounter (HOSPITAL_COMMUNITY): Payer: Self-pay | Admitting: General Surgery

## 2019-06-13 NOTE — Anesthesia Postprocedure Evaluation (Signed)
Anesthesia Post Note  Patient: Jill Leonard  Procedure(s) Performed: VENTRAL HERNIA REPAIR WITH MESH (N/A )     Patient location during evaluation: PACU Anesthesia Type: General Level of consciousness: awake and alert Pain management: pain level controlled Vital Signs Assessment: post-procedure vital signs reviewed and stable Respiratory status: spontaneous breathing, nonlabored ventilation and respiratory function stable Cardiovascular status: blood pressure returned to baseline and stable Postop Assessment: no apparent nausea or vomiting Anesthetic complications: no    Last Vitals:  Vitals:   06/12/19 1530 06/12/19 1545  BP: 95/62 105/73  Pulse: 73 73  Resp: 14 13  Temp:    SpO2: 94% 95%    Last Pain:  Vitals:   06/12/19 1430  TempSrc:   PainSc: Asleep   Pain Goal: Patients Stated Pain Goal: 3 (06/12/19 1210)                 Lynda Rainwater

## 2019-07-14 ENCOUNTER — Telehealth: Payer: Self-pay

## 2019-07-14 ENCOUNTER — Ambulatory Visit (HOSPITAL_COMMUNITY)
Admission: EM | Admit: 2019-07-14 | Discharge: 2019-07-14 | Disposition: A | Discharge status: Home / Self Care | Payer: BC Managed Care – PPO | Attending: Family Medicine | Admitting: Family Medicine

## 2019-07-14 ENCOUNTER — Other Ambulatory Visit: Payer: Self-pay

## 2019-07-14 ENCOUNTER — Encounter (HOSPITAL_COMMUNITY): Payer: Self-pay

## 2019-07-14 DIAGNOSIS — L089 Local infection of the skin and subcutaneous tissue, unspecified: Secondary | ICD-10-CM | POA: Diagnosis not present

## 2019-07-14 DIAGNOSIS — B9689 Other specified bacterial agents as the cause of diseases classified elsewhere: Secondary | ICD-10-CM | POA: Diagnosis not present

## 2019-07-14 MED ORDER — MUPIROCIN CALCIUM 2 % NA OINT: TOPICAL_OINTMENT | NASAL | 0 refills | Status: DC

## 2019-07-14 MED ORDER — CEFADROXIL 500 MG PO CAPS: 500.0000 mg | ORAL_CAPSULE | Freq: Two times a day (BID) | ORAL | 0 refills | Status: DC

## 2019-07-14 NOTE — Discharge Instructions (Addendum)
Take antibiotic 2 x a day Take 2 doses today Use the ointment 2 x a day until clears Call or return if worsens or fails to resolve

## 2019-07-14 NOTE — ED Provider Notes (Signed)
[place]    CSN: [id number] Arrival date & time: 07/14/19  0915      History   Chief Complaint Chief Complaint  Patient presents with  . Sore on Lip    HPI Jill Leonard is a 48 y.o. female.   HPI  Patient states that she had a "pimple" on edge of her lip.  She states that she squeezed this, and thought everything was okay.  Over the next couple of days the area got more swollen, more red, it is "oozing".  She thinks she has a skin infection.  She is here requesting antibiotics.  She has never had problems with infections on her lips, never had cold sores  Past Medical History:  Diagnosis Date  . Anal fissure   . Arthralgia of temporomandibular joint    MIGRAINES AND REGULAR  . Calculus of gallbladder without mention of cholecystitis or obstruction   . Complication of anesthesia   . Depression   . Dysrhythmia    RAPID HEARTBEAT AND SKIPPED BEATS ON METOPROLOL  . Family history of adverse reaction to anesthesia    FAMILY MEMBERS HAVE PONV  . Fibromyalgia   . GERD (gastroesophageal reflux disease)   . Heart murmur    IN PAST, TOLD IS GONE NOW  . History of kidney stones   . History of nephrolithiasis   . HLD (hyperlipidemia)   . PONV (postoperative nausea and vomiting)   . Sjogren - Larsson's syndrome   . Sjogren's syndrome [facility])     Patient Active Problem List   Diagnosis Date Noted  . Fibroid, uterine 08/02/2018  . Subjective tinnitus of both ears 06/06/2018  . 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 Anxiety and depression 01/09/2018  . Fibromyalgia 01/09/2018  . Insomnia 01/09/2018  . Sjogren's disease ([place]) 01/09/2018  . GERD 06/21/2008    Past Surgical History:  Procedure Laterality Date  . ABDOMINAL HYSTERECTOMY    . CHOLECYSTECTOMY  08/20/08  . MANDIBLE SURGERY    . VENTRAL HERNIA REPAIR N/A 06/12/2019   Procedure: VENTRAL HERNIA REPAIR WITH MESH;  Surgeon:  Jovita Kussmaul, MD;  Location: [facility];  Service: General;  Laterality: N/A;  . WISDOM TOOTH EXTRACTION      OB History    Gravida  1   Para  1   Term      Preterm      AB      Living  1     SAB      TAB      Ectopic      Multiple      Live Births               Home Medications    Prior to Admission medications   Medication Sig Start Date End Date Taking? Authorizing Provider  Ascorbic Acid (VITAMIN C) 1000 MG tablet Take 1,000 mg by mouth daily.    [provider]  aspirin-acetaminophen-caffeine (EXCEDRIN MIGRAINE) [phone] MG tablet Take 1 tablet by mouth every 8 (eight) hours as needed for headache or migraine.    [provider]  cefadroxil (DURICEF) 500 MG capsule Take 1 capsule (500 mg total) by mouth 2 (two) times daily. 07/14/19   Raylene Everts, MD  cholecalciferol (VITAMIN D) 1000 UNITS tablet Take 1,000 Units by mouth every evening.     [provider]  diphenhydramine-acetaminophen (TYLENOL PM) 25-500 MG TABS tablet Take 1  tablet by mouth at bedtime.     [provider]  fluticasone (FLONASE) 50 MCG/ACT nasal spray Place 2 sprays into both nostrils daily as needed for allergies or rhinitis.    [provider]  HYDROcodone-acetaminophen (NORCO/VICODIN) 5-325 MG tablet Take 1-2 tablets by mouth every 6 (six) hours as needed for moderate pain or severe pain. 06/12/19   Autumn Messing III, MD  metoprolol succinate (TOPROL-XL) 25 MG 24 hr tablet Take 0.5 tablets (12.5 mg total) by mouth daily. 11/09/18   Wellington Hampshire, MD  mupirocin nasal ointment (BACTROBAN) 2 % Apply to wound 1-2 times a day 07/14/19   Raylene Everts, MD  ondansetron [facility]) 4 MG tablet Take 1 tablet (4 mg total) by mouth daily as needed for nausea or vomiting. 06/12/19 06/11/20  Autumn Messing III, MD  pantoprazole (PROTONIX) 40 MG tablet TAKE 1 TABLET DAILY Patient taking differently: Take 40 mg by mouth daily.  11/25/18   Elby Beck, FNP   Probiotic Product (PROBIOTIC DAILY PO) Take 1 capsule by mouth every evening.     [provider]  promethazine (PHENERGAN) 12.5 MG tablet Take 1-2 tablets (12.5-25 mg total) by mouth every 6 (six) hours as needed for nausea. 06/12/19   Jovita Kussmaul, MD  vitamin B-12 (CYANOCOBALAMIN) 1000 MCG tablet Take 1,000 mcg by mouth every evening.     [provider]    Family History Family History  Problem Relation Age of Onset  . Colon polyps Father   . Bone cancer Other        Grandmother  . Diabetes Other        Grandmother  . Heart disease Other        Grandmother    Social History Social History   Tobacco Use  . Smoking status: Never Smoker  . Smokeless tobacco: Never Used  Substance Use Topics  . Alcohol use: Yes    Alcohol/week: 0.0 standard drinks    Comment: Socially-rare  . Drug use: No     Allergies   Albuterol, Sulfonamide derivatives, Trazodone and nefazodone, and Latex   Review of Systems Review of Systems  Constitutional: Negative for chills and fever.  HENT: Negative for ear pain and sore throat.   Eyes: Negative for pain and visual disturbance.  Respiratory: Negative for cough and shortness of breath.   Cardiovascular: Negative for chest pain and palpitations.  Gastrointestinal: Negative for abdominal pain and vomiting.  Genitourinary: Negative for dysuria and hematuria.  Musculoskeletal: Negative for arthralgias and back pain.  Skin: Positive for wound. Negative for color change and rash.  Neurological: Negative for seizures and syncope.  All other systems reviewed and are negative.    Physical Exam Triage Vital Signs ED Triage Vitals  Enc Vitals Group     BP 07/14/19 0931 (!) 136/92     Pulse Rate 07/14/19 0931 (!) 104     Resp 07/14/19 0931 18     Temp 07/14/19 0931 98.9 F (37.2 C)     Temp Source 07/14/19 0931 Oral     SpO2 07/14/19 0931 100 %     Weight --      Height --      Head Circumference --      Peak Flow --       Pain Score 07/14/19 0932 2     Pain Loc --      Pain Edu? --      Excl. in GC? --    No  data found.  Updated Vital Signs BP (!) 136/92 (BP Location: Right Arm)   Pulse (!) 104   Temp 98.9 F (37.2 C) (Oral)   Resp 18   LMP 07/03/2018   SpO2 100%      Physical Exam Constitutional:      General: She is not in acute distress.    Appearance: She is well-developed.  HENT:     Head: Normocephalic and atraumatic.     Mouth/Throat:   Eyes:     Conjunctiva/sclera: Conjunctivae normal.     Pupils: Pupils are equal, round, and reactive to light.  Neck:     Musculoskeletal: Normal range of motion.  Cardiovascular:     Rate and Rhythm: Normal rate.  Pulmonary:     Effort: Pulmonary effort is normal. No respiratory distress.  Abdominal:     General: There is no distension.     Palpations: Abdomen is soft.  Musculoskeletal: Normal range of motion.  Skin:    General: Skin is warm and dry.  Neurological:     Mental Status: She is alert.      UC Treatments / Results  Labs (all labs ordered are listed, but only abnormal results are displayed) Labs Reviewed - No data to display  EKG   Radiology No results found.  Procedures Procedures (including critical care time)  Medications Ordered in UC Medications - No data to display  Initial Impression / Assessment and Plan / UC Course  I have reviewed the triage vital signs and the nursing notes.  Pertinent labs & imaging results that were available during my care of the patient were reviewed by me and considered in my medical decision making (see chart for details).     This looks like a small area of impetigo.  Possibly could be a viral infection although appearance does not suggest this.  We will treat with antibiotics and mupirocin Final Clinical Impressions(s) / UC Diagnoses   Final diagnoses:  Skin infection, bacterial     Discharge Instructions     Take antibiotic 2 x a day Take 2 doses today Use the  ointment 2 x a day until clears Call or return if worsens or fails to resolve   ED Prescriptions    Medication Sig Dispense Auth. Provider   mupirocin nasal ointment (BACTROBAN) 2 % Apply to wound 1-2 times a day 1 g Raylene Everts, MD   cefadroxil (DURICEF) 500 MG capsule Take 1 capsule (500 mg total) by mouth 2 (two) times daily. 14 capsule Raylene Everts, MD     PDMP not reviewed this encounter.   Raylene Everts, MD 07/14/19 [phone]

## 2019-07-14 NOTE — Telephone Encounter (Signed)
Pt has a sore area on her mouth that has multiple blisters; pt has swollen glands, scratchy throat,dry cough and runny nose.pt is anxious and heart rate is 112 now which is high for pt. Pt is shaky; has not taken BP; pt hs no other covid symptoms, no CP or SOB. No travel or known exposure to covid. Pt was advised to go to UC for eval and pt voiced understanding and will go to [facility].FYI to Glenda Chroman FNP.

## 2019-07-14 NOTE — ED Triage Notes (Signed)
Pt presents with sore on right side of lip X 2 days

## 2019-07-16 ENCOUNTER — Other Ambulatory Visit: Payer: Self-pay

## 2019-07-16 ENCOUNTER — Ambulatory Visit (HOSPITAL_COMMUNITY)
Admission: EM | Admit: 2019-07-16 | Discharge: 2019-07-16 | Disposition: A | Discharge status: Home / Self Care | Payer: BC Managed Care – PPO | Attending: Internal Medicine | Admitting: Internal Medicine

## 2019-07-16 ENCOUNTER — Encounter (HOSPITAL_COMMUNITY): Payer: Self-pay

## 2019-07-16 DIAGNOSIS — E785 Hyperlipidemia, unspecified: Secondary | ICD-10-CM | POA: Insufficient documentation

## 2019-07-16 DIAGNOSIS — R509 Fever, unspecified: Secondary | ICD-10-CM

## 2019-07-16 DIAGNOSIS — R6889 Other general symptoms and signs: Secondary | ICD-10-CM | POA: Diagnosis not present

## 2019-07-16 DIAGNOSIS — E860 Dehydration: Secondary | ICD-10-CM | POA: Insufficient documentation

## 2019-07-16 DIAGNOSIS — Z20828 Contact with and (suspected) exposure to other viral communicable diseases: Secondary | ICD-10-CM | POA: Insufficient documentation

## 2019-07-16 DIAGNOSIS — G4700 Insomnia, unspecified: Secondary | ICD-10-CM | POA: Diagnosis not present

## 2019-07-16 DIAGNOSIS — R002 Palpitations: Secondary | ICD-10-CM | POA: Diagnosis not present

## 2019-07-16 DIAGNOSIS — Z7982 Long term (current) use of aspirin: Secondary | ICD-10-CM | POA: Diagnosis not present

## 2019-07-16 DIAGNOSIS — Z79899 Other long term (current) drug therapy: Secondary | ICD-10-CM | POA: Diagnosis not present

## 2019-07-16 DIAGNOSIS — M797 Fibromyalgia: Secondary | ICD-10-CM | POA: Diagnosis not present

## 2019-07-16 DIAGNOSIS — M3500 Sicca syndrome, unspecified: Secondary | ICD-10-CM | POA: Diagnosis not present

## 2019-07-16 DIAGNOSIS — K219 Gastro-esophageal reflux disease without esophagitis: Secondary | ICD-10-CM | POA: Diagnosis not present

## 2019-07-16 DIAGNOSIS — R000 Tachycardia, unspecified: Secondary | ICD-10-CM

## 2019-07-16 DIAGNOSIS — R0981 Nasal congestion: Secondary | ICD-10-CM

## 2019-07-16 DIAGNOSIS — R5381 Other malaise: Secondary | ICD-10-CM | POA: Diagnosis not present

## 2019-07-16 LAB — CBC
HCT: 46.6 % — ABNORMAL HIGH (ref 36.0–46.0)
Hemoglobin: 15.3 g/dL — ABNORMAL HIGH (ref 12.0–15.0)
MCH: 28 pg (ref 26.0–34.0)
MCHC: 32.8 g/dL (ref 30.0–36.0)
MCV: 85.3 fL (ref 80.0–100.0)
Platelets: 274 10*3/uL (ref 150–400)
RBC: 5.46 MIL/uL — ABNORMAL HIGH (ref 3.87–5.11)
RDW: 13.2 % (ref 11.5–15.5)
WBC: 4.7 10*3/uL (ref 4.0–10.5)
nRBC: 0 % (ref 0.0–0.2)

## 2019-07-16 LAB — BASIC METABOLIC PANEL
Anion gap: 8 (ref 5–15)
BUN: 7 mg/dL (ref 6–20)
CO2: 27 mmol/L (ref 22–32)
Calcium: 9.6 mg/dL (ref 8.9–10.3)
Chloride: 105 mmol/L (ref 98–111)
Creatinine, Ser: 0.83 mg/dL (ref 0.44–1.00)
GFR calc Af Amer: >60 mL/min (ref >60)
GFR calc non Af Amer: >60 mL/min (ref >60)
Glucose, Bld: 98 mg/dL (ref 70–99)
Potassium: 3.8 mmol/L (ref 3.5–5.1)
Sodium: 140 mmol/L (ref 135–145)

## 2019-07-16 LAB — TSH: TSH: 1.37 u[IU]/mL (ref 0.350–4.500)

## 2019-07-16 MED ORDER — SODIUM CHLORIDE 0.9 % IV BOLUS
1000.0000 mL | Freq: Once | INTRAVENOUS | Status: AC
  Administered 2019-07-16: 13:00:00 1000 mL via INTRAVENOUS

## 2019-07-16 NOTE — ED Provider Notes (Addendum)
[facility]    CSN: [id number] Arrival date & time: 07/16/19  1108      History   Chief Complaint Chief Complaint  Patient presents with  . Fever    HPI Jill Leonard is a 48 y.o. female recently seen for cellulitis on the right side of the mouth comes to urgent care with febrile episodes today.  Patient was seen on 10/30 for cellulitis following shaving related abrasion.  She was prescribed Duricef and mupirocin ointment.  Patient has been on the medications for a couple days.  Today she spiked a fever of 100.2 and decided to come to the urgent care to be reevaluated.  She has general malaise.  No runny nose cough or sputum production.  The area of cellulitis seems to be improving.  No nausea vomiting.  Appetite has been a little bit decreased in the past 12 or so hours.  No diarrhea no sick contacts.  Patient has some chills.  She complains of palpitations.  She denies any near syncopal episodes.  No known relieving factors.  HPI  Past Medical History:  Diagnosis Date  . Anal fissure   . Arthralgia of temporomandibular joint    MIGRAINES AND REGULAR  . Calculus of gallbladder without mention of cholecystitis or obstruction   . Complication of anesthesia   . Depression   . Dysrhythmia    RAPID HEARTBEAT AND SKIPPED BEATS ON METOPROLOL  . Family history of adverse reaction to anesthesia    FAMILY MEMBERS HAVE PONV  . Fibromyalgia   . GERD (gastroesophageal reflux disease)   . Heart murmur    IN PAST, TOLD IS GONE NOW  . History of kidney stones   . History of nephrolithiasis   . HLD (hyperlipidemia)   . PONV (postoperative nausea and vomiting)   . Sjogren - Larsson's syndrome   . Sjogren's syndrome [facility])     Patient Active Problem List   Diagnosis Date Noted  . Fibroid, uterine 08/02/2018  . Subjective tinnitus of both ears 06/06/2018  . 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  Anxiety and depression 01/09/2018  . Fibromyalgia 01/09/2018  . Insomnia 01/09/2018  . Sjogren's disease ([place]) 01/09/2018  . GERD 06/21/2008    Past Surgical History:  Procedure Laterality Date  . ABDOMINAL HYSTERECTOMY    . CHOLECYSTECTOMY  08/20/08  . MANDIBLE SURGERY    . VENTRAL HERNIA REPAIR N/A 06/12/2019   Procedure: VENTRAL HERNIA REPAIR WITH MESH;  Surgeon: Jovita Kussmaul, MD;  Location: [facility];  Service: General;  Laterality: N/A;  . WISDOM TOOTH EXTRACTION      OB History    Gravida  1   Para  1   Term      Preterm      AB      Living  1     SAB      TAB      Ectopic      Multiple      Live Births               Home Medications    Prior to Admission medications   Medication Sig Start Date End Date Taking? Authorizing Provider  Ascorbic Acid (VITAMIN C) 1000 MG tablet Take 1,000 mg by mouth daily.    [provider]  aspirin-acetaminophen-caffeine (EXCEDRIN MIGRAINE) [phone] MG tablet Take 1 tablet by mouth every 8 (eight) hours as needed for headache  or migraine.    [provider]  cefadroxil (DURICEF) 500 MG capsule Take 1 capsule (500 mg total) by mouth 2 (two) times daily. 07/14/19   Raylene Everts, MD  cholecalciferol (VITAMIN D) 1000 UNITS tablet Take 1,000 Units by mouth every evening.     [provider]  diphenhydramine-acetaminophen (TYLENOL PM) 25-500 MG TABS tablet Take 1 tablet by mouth at bedtime.     [provider]  fluticasone (FLONASE) 50 MCG/ACT nasal spray Place 2 sprays into both nostrils daily as needed for allergies or rhinitis.    [provider]  HYDROcodone-acetaminophen (NORCO/VICODIN) 5-325 MG tablet Take 1-2 tablets by mouth every 6 (six) hours as needed for moderate pain or severe pain. 06/12/19   Autumn Messing III, MD  metoprolol succinate (TOPROL-XL) 25 MG 24 hr tablet Take 0.5 tablets (12.5 mg total) by mouth daily. 11/09/18   Wellington Hampshire, MD  mupirocin nasal  ointment (BACTROBAN) 2 % Apply to wound 1-2 times a day 07/14/19   Raylene Everts, MD  ondansetron [facility]) 4 MG tablet Take 1 tablet (4 mg total) by mouth daily as needed for nausea or vomiting. 06/12/19 06/11/20  Autumn Messing III, MD  pantoprazole (PROTONIX) 40 MG tablet TAKE 1 TABLET DAILY Patient taking differently: Take 40 mg by mouth daily.  11/25/18   Elby Beck, FNP  Probiotic Product (PROBIOTIC DAILY PO) Take 1 capsule by mouth every evening.     [provider]  promethazine (PHENERGAN) 12.5 MG tablet Take 1-2 tablets (12.5-25 mg total) by mouth every 6 (six) hours as needed for nausea. 06/12/19   Jovita Kussmaul, MD  vitamin B-12 (CYANOCOBALAMIN) 1000 MCG tablet Take 1,000 mcg by mouth every evening.     [provider]    Family History Family History  Problem Relation Age of Onset  . Colon polyps Father   . Bone cancer Other        Grandmother  . Diabetes Other        Grandmother  . Heart disease Other        Grandmother    Social History Social History   Tobacco Use  . Smoking status: Never Smoker  . Smokeless tobacco: Never Used  Substance Use Topics  . Alcohol use: Yes    Alcohol/week: 0.0 standard drinks    Comment: Socially-rare  . Drug use: No     Allergies   Albuterol, Sulfonamide derivatives, Trazodone and nefazodone, and Latex   Review of Systems Review of Systems  Constitutional: Positive for activity change, appetite change, chills, fatigue and fever. Negative for diaphoresis.  HENT: Negative for congestion, ear discharge, ear pain, facial swelling, rhinorrhea, sinus pressure, sinus pain and sore throat.   Eyes: Negative.   Respiratory: Negative.  Negative for cough, shortness of breath and wheezing.   Cardiovascular: Negative.   Gastrointestinal: Negative.   Endocrine: Negative.   Genitourinary: Negative.  Negative for dysuria, frequency, genital sores, hematuria, urgency, vaginal bleeding and vaginal pain.   Musculoskeletal: Negative.  Negative for arthralgias, joint swelling and myalgias.  Skin: Positive for color change. Negative for pallor, rash and wound.  Neurological: Positive for weakness and light-headedness. Negative for dizziness, syncope, facial asymmetry and headaches.  Psychiatric/Behavioral: Negative for confusion and decreased concentration.  All other systems reviewed and are negative.    Physical Exam Triage Vital Signs ED Triage Vitals [07/16/19 1149]  Enc Vitals Group     BP (!) 129/91     Pulse Rate Marland Kitchen)  122     Resp 17     Temp 99.1 F (37.3 C)     Temp Source Oral     SpO2 97 %     Weight      Height      Head Circumference      Peak Flow      Pain Score 0     Pain Loc      Pain Edu?      Excl. in [place]?    No data found.  Updated Vital Signs BP (!) 129/91 (BP Location: Left Arm)   Pulse (!) 122   Temp 99.1 F (37.3 C) (Oral)   Resp 17   LMP 07/03/2018   SpO2 97%   Visual Acuity Right Eye Distance:   Left Eye Distance:   Bilateral Distance:    Right Eye Near:   Left Eye Near:    Bilateral Near:     Physical Exam Vitals signs and nursing note reviewed.  Constitutional:      General: She is not in acute distress.    Appearance: She is ill-appearing.  HENT:     Head: Normocephalic and atraumatic.     Right Ear: Tympanic membrane normal.     Left Ear: Tympanic membrane normal.     Nose: Congestion present. No rhinorrhea.     Mouth/Throat:     Mouth: Mucous membranes are moist.     Pharynx: No oropharyngeal exudate or posterior oropharyngeal erythema.     Comments: Superficial ulceration on the right side of the mouth.  It is in the region of the angle of the mouth.  Minimal surrounding erythema.  No induration. Eyes:     Extraocular Movements: Extraocular movements intact.     Conjunctiva/sclera: Conjunctivae normal.     Pupils: Pupils are equal, round, and reactive to light.  Neck:     Musculoskeletal: Normal range of motion and neck  supple. No neck rigidity or muscular tenderness.  Cardiovascular:     Rate and Rhythm: Normal rate and regular rhythm.     Pulses: Normal pulses.     Heart sounds: No murmur. No friction rub.  Pulmonary:     Effort: Pulmonary effort is normal. No respiratory distress.     Breath sounds: Normal breath sounds. No rhonchi or rales.  Abdominal:     General: Bowel sounds are normal.     Palpations: Abdomen is soft.  Musculoskeletal: Normal range of motion.        General: No swelling or signs of injury.     Right lower leg: No edema.     Left lower leg: No edema.  Skin:    General: Skin is warm.     Capillary Refill: Capillary refill takes less than 2 seconds.     Findings: Erythema and lesion present. No bruising or rash.  Neurological:     General: No focal deficit present.     Mental Status: She is alert and oriented to person, place, and time.  Psychiatric:        Mood and Affect: Mood normal.      UC Treatments / Results  Labs (all labs ordered are listed, but only abnormal results are displayed) Labs Reviewed  NOVEL CORONAVIRUS, NAA (HOSP ORDER, SEND-OUT TO REF LAB; TAT 18-24 HRS)  CBC  BASIC METABOLIC PANEL  TSH    EKG   Radiology No results found.  Procedures Procedures (including critical care time)  Medications Ordered in UC Medications  sodium  chloride 0.9 % bolus 1,000 mL (1,000 mLs Intravenous New Bag/Given 07/16/19 1234)    Initial Impression / Assessment and Plan / UC Course  I have reviewed the triage vital signs and the nursing notes.  Pertinent labs & imaging results that were available during my care of the patient were reviewed by me and considered in my medical decision making (see chart for details).     1.  Febrile episode: COVID-19 testing done Normal saline 1 L bolus CBC, BMP, TSH now Patient is tachycardic with a heart rate of 122 with some symptoms.  Blood pressure will be rechecked after normal saline bolus is completed Patient is  advised to self isolate until COVID-19 test results are available Patient will continue [place]. If patient continues to have worsening symptoms she may need to go to the emergency department to be reevaluated. Repeat vitals after IV fluids REVEALS blood pressure 126/87 and heart rate of 84. Final Clinical Impressions(s) / UC Diagnoses   Final diagnoses:  None   Discharge Instructions   None    ED Prescriptions    None     PDMP not reviewed this encounter.   Chase Picket, MD 07/16/19 1326    Chase Picket, MD 07/16/19 [id number]

## 2019-07-16 NOTE — ED Triage Notes (Signed)
Pt present fever and elevated heart rate. Symptoms started this morning.

## 2019-07-16 NOTE — ED Notes (Signed)
IV fluid bolus continues.  Comfort measures offered.

## 2019-07-17 LAB — NOVEL CORONAVIRUS, NAA (HOSP ORDER, SEND-OUT TO REF LAB; TAT 18-24 HRS): SARS-CoV-2, NAA: NOT DETECTED

## 2019-07-17 NOTE — Telephone Encounter (Signed)
Noted. Patient seen and treated at [facility].

## 2019-07-18 ENCOUNTER — Other Ambulatory Visit: Payer: Self-pay

## 2019-07-18 ENCOUNTER — Emergency Department (HOSPITAL_COMMUNITY)
Admission: EM | Admit: 2019-07-18 | Discharge: 2019-07-18 | Disposition: A | Discharge status: Home / Self Care | Payer: BC Managed Care – PPO | Attending: Emergency Medicine | Admitting: Emergency Medicine

## 2019-07-18 DIAGNOSIS — R000 Tachycardia, unspecified: Secondary | ICD-10-CM | POA: Diagnosis not present

## 2019-07-18 DIAGNOSIS — L0100 Impetigo, unspecified: Secondary | ICD-10-CM | POA: Insufficient documentation

## 2019-07-18 DIAGNOSIS — E860 Dehydration: Secondary | ICD-10-CM | POA: Diagnosis not present

## 2019-07-18 DIAGNOSIS — Z79899 Other long term (current) drug therapy: Secondary | ICD-10-CM | POA: Diagnosis not present

## 2019-07-18 DIAGNOSIS — L03211 Cellulitis of face: Secondary | ICD-10-CM | POA: Diagnosis not present

## 2019-07-18 DIAGNOSIS — Z91040 Latex allergy status: Secondary | ICD-10-CM | POA: Diagnosis not present

## 2019-07-18 DIAGNOSIS — R002 Palpitations: Secondary | ICD-10-CM | POA: Diagnosis not present

## 2019-07-18 LAB — CBC WITH DIFFERENTIAL/PLATELET
Abs Immature Granulocytes: 0.01 10*3/uL (ref 0.00–0.07)
Basophils Absolute: 0 10*3/uL (ref 0.0–0.1)
Basophils Relative: 1 %
Eosinophils Absolute: 0.1 10*3/uL (ref 0.0–0.5)
Eosinophils Relative: 3 %
HCT: 45.7 % (ref 36.0–46.0)
Hemoglobin: 15.1 g/dL — ABNORMAL HIGH (ref 12.0–15.0)
Immature Granulocytes: 0 %
Lymphocytes Relative: 33 %
Lymphs Abs: 1.2 10*3/uL (ref 0.7–4.0)
MCH: 28.3 pg (ref 26.0–34.0)
MCHC: 33 g/dL (ref 30.0–36.0)
MCV: 85.7 fL (ref 80.0–100.0)
Monocytes Absolute: 0.2 10*3/uL (ref 0.1–1.0)
Monocytes Relative: 7 %
Neutro Abs: 2 10*3/uL (ref 1.7–7.7)
Neutrophils Relative %: 56 %
Platelets: 246 10*3/uL (ref 150–400)
RBC: 5.33 MIL/uL — ABNORMAL HIGH (ref 3.87–5.11)
RDW: 13.1 % (ref 11.5–15.5)
WBC: 3.5 10*3/uL — ABNORMAL LOW (ref 4.0–10.5)
nRBC: 0 % (ref 0.0–0.2)

## 2019-07-18 LAB — URINALYSIS, ROUTINE W REFLEX MICROSCOPIC
Bilirubin Urine: NEGATIVE
Glucose, UA: NEGATIVE mg/dL
Hgb urine dipstick: NEGATIVE
Ketones, ur: NEGATIVE mg/dL
Leukocytes,Ua: NEGATIVE
Nitrite: NEGATIVE
Protein, ur: NEGATIVE mg/dL
Specific Gravity, Urine: 1.002 — ABNORMAL LOW (ref 1.005–1.030)
pH: 8 (ref 5.0–8.0)

## 2019-07-18 LAB — COMPREHENSIVE METABOLIC PANEL
ALT: 28 U/L (ref 0–44)
AST: 33 U/L (ref 15–41)
Albumin: 4.1 g/dL (ref 3.5–5.0)
Alkaline Phosphatase: 65 U/L (ref 38–126)
Anion gap: 13 (ref 5–15)
BUN: 5 mg/dL — ABNORMAL LOW (ref 6–20)
CO2: 25 mmol/L (ref 22–32)
Calcium: 9.9 mg/dL (ref 8.9–10.3)
Chloride: 102 mmol/L (ref 98–111)
Creatinine, Ser: 0.79 mg/dL (ref 0.44–1.00)
GFR calc Af Amer: >60 mL/min (ref >60)
GFR calc non Af Amer: >60 mL/min (ref >60)
Glucose, Bld: 93 mg/dL (ref 70–99)
Potassium: 3.6 mmol/L (ref 3.5–5.1)
Sodium: 140 mmol/L (ref 135–145)
Total Bilirubin: 0.5 mg/dL (ref 0.3–1.2)
Total Protein: 8.2 g/dL — ABNORMAL HIGH (ref 6.5–8.1)

## 2019-07-18 LAB — LACTIC ACID, PLASMA
Lactic Acid, Venous: 1.3 mmol/L (ref 0.5–1.9)
Lactic Acid, Venous: 1.4 mmol/L (ref 0.5–1.9)

## 2019-07-18 LAB — MAGNESIUM: Magnesium: 2 mg/dL (ref 1.7–2.4)

## 2019-07-18 MED ORDER — SODIUM CHLORIDE 0.9 % IV BOLUS
1000.0000 mL | Freq: Once | INTRAVENOUS | Status: AC
  Administered 2019-07-18: 1000 mL via INTRAVENOUS

## 2019-07-18 MED ORDER — CLINDAMYCIN HCL 150 MG PO CAPS: 450.0000 mg | ORAL_CAPSULE | Freq: Three times a day (TID) | ORAL | 0 refills | Status: AC

## 2019-07-18 NOTE — ED Triage Notes (Signed)
Recheck cellulitis on face-- was seen at [facility] on Friday and Sunday-- today is having tachycardia- and is concerned that infection is not getting better.  Negative Covid test on sunday

## 2019-07-18 NOTE — Discharge Instructions (Signed)
Your work-up today is consistent with cellulitis/impetigo of the face.  As it did not improve with the other antibiotics, please use the new antibiotics to help treat this.  Please try and push hydration and rest.  Please follow-up with your primary doctor.  If any symptoms change or worsen, please return to the nearest emergency department.

## 2019-07-18 NOTE — ED Provider Notes (Signed)
[facility] EMERGENCY DEPARTMENT Provider Note   CSN: [id number] Arrival date & time: 07/18/19  0813     History   Chief Complaint Chief Complaint  Patient presents with  . Recurrent Skin Infections    HPI Jill Leonard is a 48 y.o. female.     The history is provided by the patient and medical records. No language interpreter was used.  Palpitations Palpitations quality:  Fast Onset quality:  Gradual Duration:  4 days Timing:  Intermittent Progression:  Waxing and waning Chronicity:  New Context: anxiety   Relieved by:  Nothing Worsened by:  Nothing Associated symptoms: malaise/fatigue   Associated symptoms: no back pain, no chest pain, no cough, no diaphoresis, no dizziness, no leg pain, no lower extremity edema, no nausea, no near-syncope, no numbness, no shortness of breath, no vomiting and no weakness     Past Medical History:  Diagnosis Date  . Anal fissure   . Arthralgia of temporomandibular joint    MIGRAINES AND REGULAR  . Calculus of gallbladder without mention of cholecystitis or obstruction   . Complication of anesthesia   . Depression   . Dysrhythmia    RAPID HEARTBEAT AND SKIPPED BEATS ON METOPROLOL  . Family history of adverse reaction to anesthesia    FAMILY MEMBERS HAVE PONV  . Fibromyalgia   . GERD (gastroesophageal reflux disease)   . Heart murmur    IN PAST, TOLD IS GONE NOW  . History of kidney stones   . History of nephrolithiasis   . HLD (hyperlipidemia)   . PONV (postoperative nausea and vomiting)   . Sjogren - Larsson's syndrome   . Sjogren's syndrome [facility])     Patient Active Problem List   Diagnosis Date Noted  . Fibroid, uterine 08/02/2018  . Subjective tinnitus of both ears 06/06/2018  . 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 Anxiety and depression 01/09/2018  . Fibromyalgia 01/09/2018  . Insomnia 01/09/2018  . Sjogren's disease  ([place]) 01/09/2018  . GERD 06/21/2008    Past Surgical History:  Procedure Laterality Date  . ABDOMINAL HYSTERECTOMY    . CHOLECYSTECTOMY  08/20/08  . MANDIBLE SURGERY    . VENTRAL HERNIA REPAIR N/A 06/12/2019   Procedure: VENTRAL HERNIA REPAIR WITH MESH;  Surgeon: Jovita Kussmaul, MD;  Location: [place];  Service: General;  Laterality: N/A;  . WISDOM TOOTH EXTRACTION       OB History    Gravida  1   Para  1   Term      Preterm      AB      Living  1     SAB      TAB      Ectopic      Multiple      Live Births               Home Medications    Prior to Admission medications   Medication Sig Start Date End Date Taking? Authorizing Provider  Ascorbic Acid (VITAMIN C) 1000 MG tablet Take 1,000 mg by mouth daily.    [provider]  aspirin-acetaminophen-caffeine (EXCEDRIN MIGRAINE) [phone] MG tablet Take 1 tablet by mouth every 8 (eight) hours as needed for headache or migraine.    [provider]  cefadroxil (DURICEF) 500 MG capsule Take 1 capsule (500 mg total) by mouth 2 (two) times daily. 07/14/19   Raylene Everts, MD  cholecalciferol (VITAMIN D) 1000 UNITS tablet Take 1,000 Units by mouth every evening.     [provider]  diphenhydramine-acetaminophen (TYLENOL PM) 25-500 MG TABS tablet Take 1 tablet by mouth at bedtime.     [provider]  fluticasone (FLONASE) 50 MCG/ACT nasal spray Place 2 sprays into both nostrils daily as needed for allergies or rhinitis.    [provider]  HYDROcodone-acetaminophen (NORCO/VICODIN) 5-325 MG tablet Take 1-2 tablets by mouth every 6 (six) hours as needed for moderate pain or severe pain. 06/12/19   Autumn Messing III, MD  metoprolol succinate (TOPROL-XL) 25 MG 24 hr tablet Take 0.5 tablets (12.5 mg total) by mouth daily. 11/09/18   Wellington Hampshire, MD  mupirocin nasal ointment (BACTROBAN) 2 % Apply to wound 1-2 times a day 07/14/19   Raylene Everts, MD  ondansetron [facility])  4 MG tablet Take 1 tablet (4 mg total) by mouth daily as needed for nausea or vomiting. 06/12/19 06/11/20  Autumn Messing III, MD  pantoprazole (PROTONIX) 40 MG tablet TAKE 1 TABLET DAILY Patient taking differently: Take 40 mg by mouth daily.  11/25/18   Elby Beck, FNP  Probiotic Product (PROBIOTIC DAILY PO) Take 1 capsule by mouth every evening.     [provider]  promethazine (PHENERGAN) 12.5 MG tablet Take 1-2 tablets (12.5-25 mg total) by mouth every 6 (six) hours as needed for nausea. 06/12/19   Jovita Kussmaul, MD  vitamin B-12 (CYANOCOBALAMIN) 1000 MCG tablet Take 1,000 mcg by mouth every evening.     [provider]    Family History Family History  Problem Relation Age of Onset  . Colon polyps Father   . Bone cancer Other        Grandmother  . Diabetes Other        Grandmother  . Heart disease Other        Grandmother    Social History Social History   Tobacco Use  . Smoking status: Never Smoker  . Smokeless tobacco: Never Used  Substance Use Topics  . Alcohol use: Yes    Alcohol/week: 0.0 standard drinks    Comment: Socially-rare  . Drug use: No     Allergies   Albuterol, Sulfonamide derivatives, Trazodone and nefazodone, and Latex   Review of Systems Review of Systems  Constitutional: Positive for malaise/fatigue. Negative for chills, diaphoresis, fatigue and fever.  HENT: Negative for congestion.   Eyes: Negative for visual disturbance.  Respiratory: Negative for cough, chest tightness, shortness of breath, wheezing and stridor.   Cardiovascular: Positive for palpitations. Negative for chest pain, leg swelling and near-syncope.  Gastrointestinal: Negative for abdominal pain, constipation, diarrhea, nausea and vomiting.  Genitourinary: Negative for dysuria and flank pain.  Musculoskeletal: Negative for back pain, neck pain and neck stiffness.  Skin: Positive for rash. Negative for wound.  Neurological: Positive for light-headedness.  Negative for dizziness, weakness, numbness and headaches.  Psychiatric/Behavioral: Negative for agitation and confusion.     Physical Exam Updated Vital Signs BP (!) 122/96 (BP Location: Right Arm)   Pulse 100   Temp 98.4 F (36.9 C) (Oral)   Resp 20   LMP 07/03/2018   SpO2 100%   Physical Exam Vitals signs and nursing note reviewed.  Constitutional:      General: She is not in acute distress.    Appearance: She is well-developed. She is not diaphoretic.  HENT:     Head: No contusion or laceration.     Jaw: Tenderness  present. No trismus, swelling, pain on movement or malocclusion.      Right Ear: External ear normal.     Left Ear: External ear normal.     Nose: Nose normal. No congestion or rhinorrhea.     Mouth/Throat:     Mouth: Mucous membranes are moist.     Pharynx: Oropharynx is clear. No oropharyngeal exudate or posterior oropharyngeal erythema.  Eyes:     Extraocular Movements: Extraocular movements intact.     Conjunctiva/sclera: Conjunctivae normal.     Pupils: Pupils are equal, round, and reactive to light.  Neck:     Musculoskeletal: Normal range of motion and neck supple. No muscular tenderness.  Cardiovascular:     Rate and Rhythm: Regular rhythm. Tachycardia present.     Pulses: Normal pulses.     Heart sounds: No murmur.  Pulmonary:     Effort: No respiratory distress.     Breath sounds: No stridor.  Abdominal:     General: There is no distension.     Tenderness: There is no abdominal tenderness. There is no right CVA tenderness, left CVA tenderness or rebound.  Musculoskeletal:        General: Tenderness present.     Left lower leg: No edema.  Skin:    General: Skin is warm.     Capillary Refill: Capillary refill takes less than 2 seconds.     Findings: Erythema present. No rash.  Neurological:     General: No focal deficit present.     Mental Status: She is alert and oriented to person, place, and time.     Sensory: No sensory deficit.      Motor: No weakness or abnormal muscle tone.     Deep Tendon Reflexes: Reflexes are normal and symmetric.  Psychiatric:        Mood and Affect: Mood normal.      ED Treatments / Results  Labs (all labs ordered are listed, but only abnormal results are displayed) Labs Reviewed  CBC WITH DIFFERENTIAL/PLATELET - Abnormal; Notable for the following components:      Result Value   WBC 3.5 (*)    RBC 5.33 (*)    Hemoglobin 15.1 (*)    All other components within normal limits  COMPREHENSIVE METABOLIC PANEL - Abnormal; Notable for the following components:   BUN 5 (*)    Total Protein 8.2 (*)    All other components within normal limits  URINALYSIS, ROUTINE W REFLEX MICROSCOPIC - Abnormal; Notable for the following components:   Color, Urine COLORLESS (*)    Specific Gravity, Urine 1.002 (*)    All other components within normal limits  CULTURE, BLOOD (ROUTINE X 2)  CULTURE, BLOOD (ROUTINE X 2)  URINE CULTURE  MAGNESIUM  LACTIC ACID, PLASMA  LACTIC ACID, PLASMA    EKG EKG Interpretation  Date/Time:  Tuesday July 18 2019 08:23:05 EST Ventricular Rate:  99 PR Interval:    QRS Duration: 82 QT Interval:  335 QTC Calculation: 430 R Axis:   75 Text Interpretation: Sinus rhythm Anterior infarct, old when compared to prior no significant cahnges seen, NO STEMI Confirmed by Antony Blackbird [phone]) on 07/18/2019 9:30:50 AM   Radiology No results found.  Procedures Procedures (including critical care time)  Medications Ordered in ED Medications  sodium chloride 0.9 % bolus 1,000 mL (0 mLs Intravenous Stopped 07/18/19 1123)  sodium chloride 0.9 % bolus 1,000 mL (0 mLs Intravenous Stopped 07/18/19 1016)     Initial Impression / Assessment  and Plan / ED Course  I have reviewed the triage vital signs and the nursing notes.  Pertinent labs & imaging results that were available during my care of the patient were reviewed by me and considered in my medical decision making (see  chart for details).        Jill Leonard is a 48 y.o. female with a past medical history significant for GERD, anxiety, prior myalgia, Sjogren's disease, and recent cellulitis of the face who presents with, anxiety, lightheadedness, and fast heart rate.  Patient reports that she was seen several days ago for continued ulceration infection on her right face.  She reports that last week she had a pimple that she tried to pop and is concerned got infected.  Patient has been on Duricef antibiotics for the last 4 days and symptoms have been not improving and seems to be worsening.  She reports no difficulty breathing or swallowing.  She reports she is having some chills and has been having chills since then.  She reports she was having tachycardia on and off and went to an urgent care over the weekend 2 days ago and was found to be tachycardic and dehydrated.  Patient was continued on her antibiotics and was given fluids and feel better.  She reports she was Covid negative at that time.  She reports she is very concerned and anxious that the cellulitis not seem to be improving and she is still feeling bad with the lightheadedness, palpitations, tachycardia, and malaise.  She denies new urinary symptoms or GI symptoms aside from an episode of diarrhea.  She denies any new urinary symptoms.  She denies any chest pain or shortness of breath.  She reports the palpitations being intermittent.  On exam, patient does have a 1 cm area of cellulitis and purulence on her right cheek.  Patient does not have bleeding.  Patient has some tenderness but no palpated swelling or abscess.  No difficulty with jaw movement or trismus.  Oropharyngeal exam otherwise unremarkable.  No tooth tenderness.  No other extension deeper seen.  No stridor appreciated.  Lungs clear and chest is nontender.  Abdomen is nontender.  No focal neurologic deficits.  Clinically I suspect her cellulitis has not been improving on the antibiotics.   Anticipate will need to escalate her antibiotic regimen however with her tachycardia, reported diarrhea, and lightheadedness with fast heart rate, will give more fluids and check labs to look for electrolyte imbalance.  If work-up was otherwise reassuring, anticipate discharge with increased antibiotic regimen and PCP follow-up.  Given her lack of evidence of PTA or Ludwig's angina, or other deep neck infection, we agreed together to hold on any CT imaging of the head and neck or face.  Patient is agreeable to this plan initially.  Labs are overall reassuring.  Doubt sepsis.  Still feel patient does not need CT imaging of the face.  After fluids, patient felt much better and orthostatics were negative.  Patient had 1 more episode of slight tachycardia that resolved with rest.  Suspect component of anxiety.  Patient be escalated on her antibiotics for the skin infection/impetigo.  Patient agreed with PCP follow-up and try new antibiotics.  Patient will maintain hydration and understood return precautions.  Patient discharged in good condition.    Final Clinical Impressions(s) / ED Diagnoses   Final diagnoses:  Cellulitis of face  Impetigo  Tachycardia  Dehydration    ED Discharge Orders  Ordered    clindamycin (CLEOCIN) 150 MG capsule  3 times daily     07/18/19 1317          Clinical Impression: 1. Cellulitis of face   2. Impetigo   3. Tachycardia   4. Dehydration     Disposition: Discharge  Condition: Good  I have discussed the results, Dx and Tx plan with the pt(& family if present). He/she/they expressed understanding and agree(s) with the plan. Discharge instructions discussed at great length. Strict return precautions discussed and pt &/or family have verbalized understanding of the instructions. No further questions at time of discharge.    Discharge Medication List as of 07/18/2019  1:17 PM    START taking these medications   Details  clindamycin (CLEOCIN) 150  MG capsule Take 3 capsules (450 mg total) by mouth 3 (three) times daily for 10 days., Starting Tue 07/18/2019, Until Fri 07/28/2019, Print        Follow Up: Elby Beck, [place] [address] [phone]     [facility] [address] [id number] mc [place]       Akin Yi, Gwenyth Allegra, MD 07/18/19 [phone]

## 2019-07-19 ENCOUNTER — Telehealth: Payer: Self-pay

## 2019-07-19 LAB — URINE CULTURE: Culture: <10000 — AB

## 2019-07-19 NOTE — Telephone Encounter (Signed)
Patient needs ER follow up appointment

## 2019-07-19 NOTE — Telephone Encounter (Signed)
Pt declined ER follow up - states that she does not feel it is necessary to follow up unless she has worsening symptoms.  Will send to [facility] as FYI. Nothing further needed.

## 2019-07-23 LAB — CULTURE, BLOOD (ROUTINE X 2)
Culture: NO GROWTH
Culture: NO GROWTH

## 2019-07-24 NOTE — Telephone Encounter (Signed)
Noted  

## 2019-08-22 DIAGNOSIS — H9313 Tinnitus, bilateral: Secondary | ICD-10-CM | POA: Diagnosis not present

## 2019-08-22 DIAGNOSIS — H9071 Mixed conductive and sensorineural hearing loss, unilateral, right ear, with unrestricted hearing on the contralateral side: Secondary | ICD-10-CM | POA: Diagnosis not present

## 2019-08-22 DIAGNOSIS — H9011 Conductive hearing loss, unilateral, right ear, with unrestricted hearing on the contralateral side: Secondary | ICD-10-CM | POA: Diagnosis not present

## 2019-09-18 ENCOUNTER — Encounter (HOSPITAL_COMMUNITY): Payer: Self-pay

## 2019-09-18 ENCOUNTER — Other Ambulatory Visit: Payer: Self-pay

## 2019-09-18 ENCOUNTER — Ambulatory Visit (HOSPITAL_COMMUNITY)
Admission: EM | Admit: 2019-09-18 | Discharge: 2019-09-18 | Disposition: A | Discharge status: Home / Self Care | Payer: BC Managed Care – PPO | Attending: Family Medicine | Admitting: Family Medicine

## 2019-09-18 DIAGNOSIS — L03211 Cellulitis of face: Secondary | ICD-10-CM | POA: Diagnosis not present

## 2019-09-18 MED ORDER — CLINDAMYCIN HCL 150 MG PO CAPS: 450.0000 mg | ORAL_CAPSULE | Freq: Three times a day (TID) | ORAL | 0 refills | Status: AC

## 2019-09-18 MED ORDER — DOXYCYCLINE HYCLATE 100 MG PO CAPS: 100.0000 mg | ORAL_CAPSULE | Freq: Two times a day (BID) | ORAL | 0 refills | Status: DC

## 2019-09-18 NOTE — Discharge Instructions (Signed)
I opted to then just repeat the antibiotic you were given second with your last infection.  Taking a probiotic with this may be helpful. If develop significant diarrhea following antibiotics please follow up with your primary care provider.  If symptoms worsen or do not improve in the next week to return to be seen or to follow up with your PCP.

## 2019-09-18 NOTE — ED Provider Notes (Signed)
[facility]    CSN: [id number] Arrival date & time: 09/18/19  0820      History   Chief Complaint Chief Complaint  Patient presents with  . Rash    HPI Koda L Web is a 49 y.o. female.   Epifania L Ell presents with complaints of rash/redness/ infection to her nose which she first noted two nights ago as a "rawness" which has progressed since. She does note some oozing of fluid from the area. Tender and swollen. Feels that the glands to her neck have swelled and her heart rate has increased. In November she had similar near her mouth and required iv fluids and change from a cephalosporin to clinda in order to treat this. Denies any previous similar prior to this episode. No fevers. No known MRSA history. She did have hernia surgery a few months ago. Endorses some stress and anxiety. History  Of sjogren's. History  Of tachycardia.    ROS per HPI, negative if not otherwise mentioned.      Past Medical History:  Diagnosis Date  . Anal fissure   . Arthralgia of temporomandibular joint    MIGRAINES AND REGULAR  . Calculus of gallbladder without mention of cholecystitis or obstruction   . Complication of anesthesia   . Depression   . Dysrhythmia    RAPID HEARTBEAT AND SKIPPED BEATS ON METOPROLOL  . Family history of adverse reaction to anesthesia    FAMILY MEMBERS HAVE PONV  . Fibromyalgia   . GERD (gastroesophageal reflux disease)   . Heart murmur    IN PAST, TOLD IS GONE NOW  . History of kidney stones   . History of nephrolithiasis   . HLD (hyperlipidemia)   . PONV (postoperative nausea and vomiting)   . Sjogren - Larsson's syndrome   . Sjogren's syndrome [facility])     Patient Active Problem List   Diagnosis Date Noted  . Fibroid, uterine 08/02/2018  . Subjective tinnitus of both ears 06/06/2018  . 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 Anxiety and depression 01/09/2018  .  Fibromyalgia 01/09/2018  . Insomnia 01/09/2018  . Sjogren's disease ([place]) 01/09/2018  . GERD 06/21/2008    Past Surgical History:  Procedure Laterality Date  . ABDOMINAL HYSTERECTOMY    . CHOLECYSTECTOMY  08/20/08  . MANDIBLE SURGERY    . VENTRAL HERNIA REPAIR N/A 06/12/2019   Procedure: VENTRAL HERNIA REPAIR WITH MESH;  Surgeon: Jovita Kussmaul, MD;  Location: [place];  Service: General;  Laterality: N/A;  . WISDOM TOOTH EXTRACTION      OB History    Gravida  1   Para  1   Term      Preterm      AB      Living  1     SAB      TAB      Ectopic      Multiple      Live Births               Home Medications    Prior to Admission medications   Medication Sig Start Date End Date Taking? Authorizing Provider  Ascorbic Acid (VITAMIN C) 1000 MG tablet Take 1,000 mg by mouth daily.    [provider]  aspirin-acetaminophen-caffeine (EXCEDRIN MIGRAINE) [phone] MG tablet Take 1 tablet by mouth every 8 (eight) hours as needed for headache or migraine.    [provider]  cholecalciferol (VITAMIN D) 1000 UNITS tablet Take 1,000 Units by mouth every evening.     [provider]  clindamycin (CLEOCIN) 150 MG capsule Take 3 capsules (450 mg total) by mouth 3 (three) times daily for 10 days. 09/18/19 09/28/19  Zigmund Gottron, NP  diphenhydramine-acetaminophen (TYLENOL PM) 25-500 MG TABS tablet Take 1 tablet by mouth at bedtime.     [provider]  fluticasone (FLONASE) 50 MCG/ACT nasal spray Place 2 sprays into both nostrils daily as needed for allergies or rhinitis.    [provider]  HYDROcodone-acetaminophen (NORCO/VICODIN) 5-325 MG tablet Take 1-2 tablets by mouth every 6 (six) hours as needed for moderate pain or severe pain. 06/12/19   Autumn Messing III, MD  metoprolol succinate (TOPROL-XL) 25 MG 24 hr tablet Take 0.5 tablets (12.5 mg total) by mouth daily. 11/09/18   Wellington Hampshire, MD  ondansetron (ZOFRAN) 4 MG tablet Take 1  tablet (4 mg total) by mouth daily as needed for nausea or vomiting. 06/12/19 06/11/20  Autumn Messing III, MD  pantoprazole (PROTONIX) 40 MG tablet TAKE 1 TABLET DAILY Patient taking differently: Take 40 mg by mouth daily.  11/25/18   Elby Beck, FNP  Probiotic Product (PROBIOTIC DAILY PO) Take 1 capsule by mouth every evening.     [provider]  promethazine (PHENERGAN) 12.5 MG tablet Take 1-2 tablets (12.5-25 mg total) by mouth every 6 (six) hours as needed for nausea. 06/12/19   Jovita Kussmaul, MD  vitamin B-12 (CYANOCOBALAMIN) 1000 MCG tablet Take 1,000 mcg by mouth every evening.     [provider]  mupirocin nasal ointment (BACTROBAN) 2 % Apply to wound 1-2 times a day 07/14/19 09/18/19  Raylene Everts, MD    Family History Family History  Problem Relation Age of Onset  . Colon polyps Father   . Bone cancer Other        Grandmother  . Diabetes Other        Grandmother  . Heart disease Other        Grandmother    Social History Social History   Tobacco Use  . Smoking status: Never Smoker  . Smokeless tobacco: Never Used  Substance Use Topics  . Alcohol use: Yes    Alcohol/week: 0.0 standard drinks    Comment: Socially-rare  . Drug use: No     Allergies   Albuterol, Sulfonamide derivatives, Trazodone and nefazodone, and Latex   Review of Systems Review of Systems   Physical Exam Triage Vital Signs ED Triage Vitals  Enc Vitals Group     BP 09/18/19 0832 140/87     Pulse Rate 09/18/19 0832 (!) 120     Resp 09/18/19 0832 18     Temp 09/18/19 0832 98.8 F (37.1 C)     Temp Source 09/18/19 0832 Oral     SpO2 09/18/19 0832 99 %     Weight --      Height --      Head Circumference --      Peak Flow --      Pain Score 09/18/19 0830 3     Pain Loc --      Pain Edu? --      Excl. in [place]? --    No data found.  Updated Vital Signs BP 140/87 (BP Location: Right Arm)   Pulse (!) 120 Comment: normally tachycardic  Temp 98.8 F (37.1 C)  (Oral)   Resp 18   LMP 07/03/2018  SpO2 99%    Physical Exam Constitutional:      General: She is not in acute distress.    Appearance: She is well-developed.  HENT:     Nose:      Comments: Columella with redness, mild swelling, and clear pustular appearing lesions in cluster, tenderness Cardiovascular:     Rate and Rhythm: Tachycardia present.  Pulmonary:     Effort: Pulmonary effort is normal.  Skin:    General: Skin is warm and dry.  Neurological:     Mental Status: She is alert and oriented to person, place, and time.      UC Treatments / Results  Labs (all labs ordered are listed, but only abnormal results are displayed) Labs Reviewed - No data to display  EKG   Radiology No results found.  Procedures Procedures (including critical care time)  Medications Ordered in UC Medications - No data to display  Initial Impression / Assessment and Plan / UC Course  I have reviewed the triage vital signs and the nursing notes.  Pertinent labs & imaging results that were available during my care of the patient were reviewed by me and considered in my medical decision making (see chart for details).     Per patient similar presentation as what she experienced in November, near her lip, which responded promptly to clindamycin after not responding to duricef.  Noted tachycardia here today, which appears to be somewhat normal for her, however. Staph vs herpetic vs strep infectious process considered. Clindamycin repeated today with return precautions provided. Patient verbalized understanding and agreeable to plan.    Final Clinical Impressions(s) / UC Diagnoses   Final diagnoses:  Cellulitis of face     Discharge Instructions     I opted to then just repeat the antibiotic you were given second with your last infection.  Taking a probiotic with this may be helpful. If develop significant diarrhea following antibiotics please follow up with your primary care  provider.  If symptoms worsen or do not improve in the next week to return to be seen or to follow up with your PCP.      ED Prescriptions    Medication Sig Dispense Auth. Provider   doxycycline (VIBRAMYCIN) 100 MG capsule  (Status: Discontinued) Take 1 capsule (100 mg total) by mouth 2 (two) times daily. 20 capsule Augusto Gamble B, NP   clindamycin (CLEOCIN) 150 MG capsule Take 3 capsules (450 mg total) by mouth 3 (three) times daily for 10 days. 90 capsule Zigmund Gottron, NP     PDMP not reviewed this encounter.   Zigmund Gottron, NP 09/18/19 1013

## 2019-09-18 NOTE — ED Triage Notes (Signed)
Pt presents with rash on nose; pt states she had the same issue a few months ago and was diagnosed with impetigo on mouth area.

## 2019-09-25 ENCOUNTER — Other Ambulatory Visit: Payer: Self-pay

## 2019-09-25 ENCOUNTER — Ambulatory Visit: Payer: BC Managed Care – PPO | Admitting: Family Medicine

## 2019-09-25 ENCOUNTER — Encounter: Payer: Self-pay | Admitting: Family Medicine

## 2019-09-25 VITALS — BP 116/72 | HR 98 | Temp 98.2°F | Ht 67.0 in | Wt 163.8 lb

## 2019-09-25 DIAGNOSIS — J3489 Other specified disorders of nose and nasal sinuses: Secondary | ICD-10-CM

## 2019-09-25 DIAGNOSIS — L0390 Cellulitis, unspecified: Secondary | ICD-10-CM | POA: Diagnosis not present

## 2019-09-25 DIAGNOSIS — L03211 Cellulitis of face: Secondary | ICD-10-CM | POA: Diagnosis not present

## 2019-09-25 DIAGNOSIS — F419 Anxiety disorder, unspecified: Secondary | ICD-10-CM | POA: Diagnosis not present

## 2019-09-25 DIAGNOSIS — N951 Menopausal and female climacteric states: Secondary | ICD-10-CM | POA: Diagnosis not present

## 2019-09-25 DIAGNOSIS — F32A Depression, unspecified: Secondary | ICD-10-CM

## 2019-09-25 DIAGNOSIS — F5104 Psychophysiologic insomnia: Secondary | ICD-10-CM

## 2019-09-25 DIAGNOSIS — F329 Major depressive disorder, single episode, unspecified: Secondary | ICD-10-CM

## 2019-09-25 MED ORDER — MUPIROCIN 2 % EX OINT: 1.0000 "application " | TOPICAL_OINTMENT | Freq: Three times a day (TID) | CUTANEOUS | 1 refills | Status: DC

## 2019-09-25 NOTE — Progress Notes (Signed)
Subjective:    Patient ID: Jill Leonard, female    DOB: 1971/01/14, 49 y.o.   MRN: [id number]  HPI Chief Complaint  Patient presents with  . Follow-up    Follow up from UC visit  - Still having pain and oozing from infected site on face and new pain in armpit - side of left breast. Pt also notes her HR is elevated about 100 at rest.  Pt has been on antibiotic x 7 days and is not seeing much improvement.    This is a 49 yo female who presents today with above cc. She was seen September 18, 2019 and was put on clindamycin. She had previous cellulitis of face 3 months ago and was treated initially with doxycycline with out improvement then switched to clindamycin with resolution.  Area beneath narie.  She does admit to picking the skin here frequently as it gets dry and flaky.  She reports frequent sores in her nostrils.  She has pictures of erythematous, pustular lesions.  She has been applying Neosporin and Vaseline. She has been having increased anxiety and depression.  Since her hysterectomy last year and then her recurrent cellulitis she feels like her health is not good.  She is waiting for something bad to happen constantly.  She is having hot flashes.  She previously was on paroxetine but does not want to go back on that because she had difficulty weaning.  She is not sleeping well and has tried multiple things in the past including melatonin, trazodone, benzos.  Her mother tells her she needs to be on medicine.  She is open to counseling but is not thrilled with it being virtual.  She was walking when the weather was more conducive and this helped tremendously with sleep and anxiety.  She reports that she does not talk to anyone about her problems because everyone is so wrapped up in themselves and does not seem to care.  Past Medical History:  Diagnosis Date  . Anal fissure   . Arthralgia of temporomandibular joint    MIGRAINES AND REGULAR  . Calculus of gallbladder without mention of  cholecystitis or obstruction   . Complication of anesthesia   . Depression   . Dysrhythmia    RAPID HEARTBEAT AND SKIPPED BEATS ON METOPROLOL  . Family history of adverse reaction to anesthesia    FAMILY MEMBERS HAVE PONV  . Fibromyalgia   . GERD (gastroesophageal reflux disease)   . Heart murmur    IN PAST, TOLD IS GONE NOW  . History of kidney stones   . History of nephrolithiasis   . HLD (hyperlipidemia)   . PONV (postoperative nausea and vomiting)   . Sjogren - Larsson's syndrome   . Sjogren's syndrome [facility])    Past Surgical History:  Procedure Laterality Date  . ABDOMINAL HYSTERECTOMY    . CHOLECYSTECTOMY  08/20/08  . MANDIBLE SURGERY    . VENTRAL HERNIA REPAIR N/A 06/12/2019   Procedure: VENTRAL HERNIA REPAIR WITH MESH;  Surgeon: Jovita Kussmaul, MD;  Location: [facility] OR;  Service: General;  Laterality: N/A;  . WISDOM TOOTH EXTRACTION     Family History  Problem Relation Age of Onset  . Colon polyps Father   . Bone cancer Other        Grandmother  . Diabetes Other        Grandmother  . Heart disease Other        Grandmother   Social History   Tobacco Use  .  Smoking status: Never Smoker  . Smokeless tobacco: Never Used  Substance Use Topics  . Alcohol use: Yes    Alcohol/week: 0.0 standard drinks    Comment: Socially-rare  . Drug use: No      Review of Systems Per HPI    Objective:   Physical Exam Vitals reviewed.  Constitutional:      General: She is not in acute distress.    Appearance: Normal appearance. She is normal weight. She is not ill-appearing or toxic-appearing.  HENT:     Head: Normocephalic and atraumatic.     Nose:   Cardiovascular:     Rate and Rhythm: Normal rate.  Pulmonary:     Effort: Pulmonary effort is normal.  Skin:    General: Skin is warm and dry.  Neurological:     Mental Status: She is alert and oriented to person, place, and time.  Psychiatric:        Behavior: Behavior normal.        Thought Content: Thought content  normal.        Judgment: Judgment normal.     Comments: Tearful at times during visit.        BP 116/72 (BP Location: Left Arm, Patient Position: Sitting, Cuff Size: Normal)   Pulse 98   Temp 98.2 F (36.8 C) (Temporal)   Ht 5\' 7"  (1.702 m)   Wt 163 lb 12.8 oz (74.3 kg)   LMP 07/03/2018   SpO2 97%   BMI 25.65 kg/m  Wt Readings from Last 3 Encounters:  09/25/19 163 lb 12.8 oz (74.3 kg)  06/12/19 155 lb (70.3 kg)  10/18/18 154 lb 3.2 oz (69.9 kg)       Assessment & Plan:  1. Cellulitis of face -This appears improved from pictures she shows me.  Finish clindamycin.  Stop Neosporin and Vaseline.  Add mupirocin.  Will check culture of nares for MRSA. -Encouraged her to avoid touching area - MRSA culture - mupirocin ointment (BACTROBAN) 2 %; Apply 1 application topically 3 (three) times daily.  Dispense: 22 g; Refill: 1 -She has a course of doxycycline at home if we need to change her antibiotic and try this.  She will let me know if she has any worsening of lesion.  2. Sore in nose - MRSA culture  3. Perimenopausal vasomotor symptoms -Discussed medication options with her and provided her some written suggestions so she can research them.  She is very concerned about potential side effects with medication.  4. Anxiety and depression -Provided information regarding nonpharmacologic ways to deal with anxiety and depression as well as possible medications. -Encouraged her to seek counseling and provided her number to call for appointment  5. Psychophysiological insomnia -Reviewed medication she is used in the past and available options.  She did not want to try any Ambien at this point.  Encouraged regular exercise.  This visit occurred during the SARS-CoV-2 public health emergency.  Safety protocols were in place, including screening questions prior to the visit, additional usage of staff PPE, and extensive cleaning of exam room while observing appropriate contact time as  indicated for disinfecting solutions.    Clarene Reamer, FNP-BC  [facility]  09/26/2019 9:28 AM

## 2019-09-25 NOTE — Patient Instructions (Signed)
For hot flashes/ depression/ anxiety, consider venlafaxine, escitalopram, citalopram.   Consider low dose ambien for sleep.   With any of these options, please let me know if you are interested in trying. I think a very low dose of the hot flash/depression/anxiety medication will help considerably.   Medication for depression and anxiety often takes 6-8 weeks to have a noticeable difference so stick with it. Also the best way for recovery is taking medication and seeing a therapist -- this is so important.      How to help anxiety and depression   1) Regular Exercise - walking, jogging, cycling, dancing, strength training - aiming for 150 minutes of exercise a week -- Yoga has been shown in research to reduce depression and anxiety -- with even just one hour long session per week -- Walk leisurely for 30 minutes every day  2)  Begin a Mindfulness/Meditation practice -- this can take a little as 3 minutes and is helpful for all kinds of mood issues -- You can find resources in books -- Or you can download apps like  -- Headspace App  -- Calm  -- Insignt Timer -- Stop, Breathe & Think   # With each of these Apps - you should decline the "start free trial" offer and as you search through the App should be able to access some of their free content. You can also chose to pay for the content if you find one that works well for you.    # Many of them also offer sleep specific content which may help with insomnia   3) Healthy Diet - Avoid fast foods and processed foods, eat mostly lean proteins, vegetables, fruits and whole grains -- Avoid or decrease Caffeine -- Avoid or decrease Alcohol -- Drink plenty of water, have a balanced diet -- Avoid cigarettes and marijuana (as well as other recreational drugs)   4) Consider contacting a professional therapist  - [place] [phone]

## 2019-09-26 ENCOUNTER — Emergency Department (HOSPITAL_COMMUNITY): Payer: BC Managed Care – PPO

## 2019-09-26 ENCOUNTER — Encounter (HOSPITAL_COMMUNITY): Payer: Self-pay | Admitting: Emergency Medicine

## 2019-09-26 ENCOUNTER — Emergency Department (HOSPITAL_COMMUNITY)
Admission: EM | Admit: 2019-09-26 | Discharge: 2019-09-26 | Disposition: A | Discharge status: Home / Self Care | Payer: BC Managed Care – PPO | Attending: Emergency Medicine | Admitting: Emergency Medicine

## 2019-09-26 ENCOUNTER — Other Ambulatory Visit: Payer: Self-pay

## 2019-09-26 DIAGNOSIS — Z20822 Contact with and (suspected) exposure to covid-19: Secondary | ICD-10-CM | POA: Insufficient documentation

## 2019-09-26 DIAGNOSIS — Z91040 Latex allergy status: Secondary | ICD-10-CM | POA: Diagnosis not present

## 2019-09-26 DIAGNOSIS — J189 Pneumonia, unspecified organism: Secondary | ICD-10-CM | POA: Insufficient documentation

## 2019-09-26 DIAGNOSIS — M3500 Sicca syndrome, unspecified: Secondary | ICD-10-CM | POA: Diagnosis not present

## 2019-09-26 DIAGNOSIS — Z79899 Other long term (current) drug therapy: Secondary | ICD-10-CM | POA: Diagnosis not present

## 2019-09-26 DIAGNOSIS — R911 Solitary pulmonary nodule: Secondary | ICD-10-CM | POA: Insufficient documentation

## 2019-09-26 DIAGNOSIS — L0100 Impetigo, unspecified: Secondary | ICD-10-CM | POA: Diagnosis not present

## 2019-09-26 DIAGNOSIS — R002 Palpitations: Secondary | ICD-10-CM | POA: Diagnosis not present

## 2019-09-26 DIAGNOSIS — R0789 Other chest pain: Secondary | ICD-10-CM | POA: Diagnosis not present

## 2019-09-26 DIAGNOSIS — R000 Tachycardia, unspecified: Secondary | ICD-10-CM | POA: Diagnosis not present

## 2019-09-26 DIAGNOSIS — R079 Chest pain, unspecified: Secondary | ICD-10-CM | POA: Insufficient documentation

## 2019-09-26 DIAGNOSIS — R0602 Shortness of breath: Secondary | ICD-10-CM | POA: Diagnosis not present

## 2019-09-26 LAB — TROPONIN I (HIGH SENSITIVITY)
Troponin I (High Sensitivity): <2 ng/L (ref <18)
Troponin I (High Sensitivity): <2 ng/L (ref <18)

## 2019-09-26 LAB — BASIC METABOLIC PANEL
Anion gap: 8 (ref 5–15)
BUN: 8 mg/dL (ref 6–20)
CO2: 25 mmol/L (ref 22–32)
Calcium: 9.4 mg/dL (ref 8.9–10.3)
Chloride: 108 mmol/L (ref 98–111)
Creatinine, Ser: 0.87 mg/dL (ref 0.44–1.00)
GFR calc Af Amer: >60 mL/min (ref >60)
GFR calc non Af Amer: >60 mL/min (ref >60)
Glucose, Bld: 105 mg/dL — ABNORMAL HIGH (ref 70–99)
Potassium: 3.7 mmol/L (ref 3.5–5.1)
Sodium: 141 mmol/L (ref 135–145)

## 2019-09-26 LAB — CBC
HCT: 43.1 % (ref 36.0–46.0)
Hemoglobin: 14.2 g/dL (ref 12.0–15.0)
MCH: 27.8 pg (ref 26.0–34.0)
MCHC: 32.9 g/dL (ref 30.0–36.0)
MCV: 84.5 fL (ref 80.0–100.0)
Platelets: 300 10*3/uL (ref 150–400)
RBC: 5.1 MIL/uL (ref 3.87–5.11)
RDW: 12.9 % (ref 11.5–15.5)
WBC: 4.6 10*3/uL (ref 4.0–10.5)
nRBC: 0 % (ref 0.0–0.2)

## 2019-09-26 LAB — D-DIMER, QUANTITATIVE: D-Dimer, Quant: 2.14 ug/mL-FEU — ABNORMAL HIGH (ref 0.00–0.50)

## 2019-09-26 MED ORDER — IOHEXOL 350 MG/ML SOLN
100.0000 mL | Freq: Once | INTRAVENOUS | Status: AC | PRN
  Administered 2019-09-26: 100 mL via INTRAVENOUS

## 2019-09-26 MED ORDER — SODIUM CHLORIDE 0.9% FLUSH: 3.0000 mL | Freq: Once | INTRAVENOUS | Status: DC

## 2019-09-26 NOTE — ED Provider Notes (Signed)
[facility] EMERGENCY DEPARTMENT Provider Note   CSN: [id number] Arrival date & time: 09/26/19  4696     History Chief Complaint  Patient presents with  . Palpitations    Jill Leonard is a 49 y.o. female.  HPI   49 year old female with history of anxiety/depression, tachycardia, GERD, mitral valve prolapse, nephrolithiasis, hyperlipidemia, Sjogren's syndrome, who presents the emergency department today with multiple complaints.  States that she was diagnosed with impetigo about a week ago and has been on clindamycin as well as a topical ointment for this.  This is improved but she is concerned because she has some soreness under her chin and to the lymph nodes on her neck.  She also has some soreness to the left chest wall and left axilla.  Describes this as a burning, throbbing, aching pain.  She does report a history of shingles and feels like the sensation to the left side of her chest wall is the same however she does not have a rash.  She is also concerned because last night she woke up with palpitations.  She does have a history of palpitations and tachycardia and takes metoprolol for this but she feels like since she was diagnosed with impetigo the palpitations have been worse.  She had similar symptoms about 2 months ago when she was taking medication for impetigo as well.  She has had no fevers or other complaints.  Denies any risk factors for PE, except for having surgery in sept 2020.  Past Medical History:  Diagnosis Date  . Anal fissure   . Arthralgia of temporomandibular joint    MIGRAINES AND REGULAR  . Calculus of gallbladder without mention of cholecystitis or obstruction   . Complication of anesthesia   . Depression   . Dysrhythmia    RAPID HEARTBEAT AND SKIPPED BEATS ON METOPROLOL  . Family history of adverse reaction to anesthesia    FAMILY MEMBERS HAVE PONV  . Fibromyalgia   . GERD (gastroesophageal reflux disease)   . Heart murmur    IN  PAST, TOLD IS GONE NOW  . History of kidney stones   . History of nephrolithiasis   . HLD (hyperlipidemia)   . PONV (postoperative nausea and vomiting)   . Sjogren - Larsson's syndrome   . Sjogren's syndrome [facility])     Patient Active Problem List   Diagnosis Date Noted  . Fibroid, uterine 08/02/2018  . Subjective tinnitus of both ears 06/06/2018  . 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 Anxiety and depression 01/09/2018  . Fibromyalgia 01/09/2018  . Insomnia 01/09/2018  . Sjogren's disease ([place]) 01/09/2018  . GERD 06/21/2008    Past Surgical History:  Procedure Laterality Date  . ABDOMINAL HYSTERECTOMY    . CHOLECYSTECTOMY  08/20/08  . MANDIBLE SURGERY    . VENTRAL HERNIA REPAIR N/A 06/12/2019   Procedure: VENTRAL HERNIA REPAIR WITH MESH;  Surgeon: Jovita Kussmaul, MD;  Location: [place];  Service: General;  Laterality: N/A;  . WISDOM TOOTH EXTRACTION       OB History    Gravida  1   Para  1   Term      Preterm      AB      Living  1     SAB      TAB      Ectopic      Multiple      Live Births  Family History  Problem Relation Age of Onset  . Colon polyps Father   . Bone cancer Other        Grandmother  . Diabetes Other        Grandmother  . Heart disease Other        Grandmother    Social History   Tobacco Use  . Smoking status: Never Smoker  . Smokeless tobacco: Never Used  Substance Use Topics  . Alcohol use: Yes    Alcohol/week: 0.0 standard drinks    Comment: Socially-rare  . Drug use: No    Home Medications Prior to Admission medications   Medication Sig Start Date End Date Taking? Authorizing Provider  Ascorbic Acid (VITAMIN C) 1000 MG tablet Take 1,000 mg by mouth daily.   Yes [provider]  aspirin-acetaminophen-caffeine (EXCEDRIN MIGRAINE) [phone] MG tablet Take 1 tablet by mouth every 8 (eight) hours as needed for headache or  migraine.   Yes [provider]  cholecalciferol (VITAMIN D) 1000 UNITS tablet Take 1,000 Units by mouth every evening.    Yes [provider]  clindamycin (CLEOCIN) 150 MG capsule Take 3 capsules (450 mg total) by mouth 3 (three) times daily for 10 days. 09/18/19 09/28/19 Yes Burky, Natalie B, NP  diphenhydramine-acetaminophen (TYLENOL PM) 25-500 MG TABS tablet Take 1 tablet by mouth at bedtime.    Yes [provider]  fluticasone (FLONASE) 50 MCG/ACT nasal spray Place 2 sprays into both nostrils daily as needed for allergies or rhinitis.   Yes [provider]  metoprolol succinate (TOPROL-XL) 25 MG 24 hr tablet Take 0.5 tablets (12.5 mg total) by mouth daily. 11/09/18  Yes Wellington Hampshire, MD  mupirocin ointment (BACTROBAN) 2 % Apply 1 application topically 3 (three) times daily. 09/25/19  Yes Elby Beck, FNP  pantoprazole (PROTONIX) 40 MG tablet TAKE 1 TABLET DAILY Patient taking differently: Take 40 mg by mouth at bedtime.  11/25/18  Yes Elby Beck, FNP  Probiotic Product (PROBIOTIC DAILY PO) Take 1 capsule by mouth every evening.    Yes [provider]  vitamin B-12 (CYANOCOBALAMIN) 1000 MCG tablet Take 1,000 mcg by mouth every evening.    Yes [provider]  mupirocin nasal ointment (BACTROBAN) 2 % Apply to wound 1-2 times a day 07/14/19 09/18/19  Raylene Everts, MD    Allergies    Albuterol, Sulfonamide derivatives, Trazodone and nefazodone, and Latex  Review of Systems   Review of Systems  Constitutional: Negative for fever.  HENT: Negative for sore throat.   Eyes: Negative for visual disturbance.  Respiratory: Negative for cough and shortness of breath.   Cardiovascular: Positive for chest pain (chest wall pain). Negative for palpitations and leg swelling.  Gastrointestinal: Negative for abdominal pain, constipation, diarrhea, nausea and vomiting.  Genitourinary: Negative for dysuria.  Musculoskeletal: Negative  for back pain.  Skin: Positive for wound.       impetigo  Neurological: Negative for headaches.  Hematological: Positive for adenopathy.  All other systems reviewed and are negative.   Physical Exam Updated Vital Signs BP 128/71 (BP Location: Left Arm)   Pulse 92   Temp 98.3 F (36.8 C) (Oral)   Resp 14   LMP 07/03/2018   SpO2 97%   Physical Exam Vitals and nursing note reviewed.  Constitutional:      General: She is not in acute distress.    Appearance: She is well-developed.  HENT:     Head: Normocephalic and atraumatic.  Mouth/Throat:     Comments: Small scabbed area to the columella. No significant erythema or warmth.  Eyes:     Conjunctiva/sclera: Conjunctivae normal.  Cardiovascular:     Rate and Rhythm: Normal rate and regular rhythm.     Heart sounds: No murmur.  Pulmonary:     Effort: Pulmonary effort is normal. No respiratory distress.     Breath sounds: Normal breath sounds.  Abdominal:     Palpations: Abdomen is soft.     Tenderness: There is no abdominal tenderness.  Musculoskeletal:     Cervical back: Neck supple.  Skin:    General: Skin is warm and dry.  Neurological:     Mental Status: She is alert.     ED Results / Procedures / Treatments   Labs (all labs ordered are listed, but only abnormal results are displayed) Labs Reviewed  BASIC METABOLIC PANEL - Abnormal; Notable for the following components:      Result Value   Glucose, Bld 105 (*)    All other components within normal limits  D-DIMER, QUANTITATIVE (NOT AT [facility]) - Abnormal; Notable for the following components:   D-Dimer, Quant 2.14 (*)    All other components within normal limits  CBC  I-STAT BETA HCG BLOOD, ED ([facility], [facility], [facility] ONLY)  TROPONIN I (HIGH SENSITIVITY)  TROPONIN I (HIGH SENSITIVITY)    EKG None    Radiology DG Chest 2 View  Result Date: 09/26/2019 CLINICAL DATA:  Shortness of breath.  Left-sided chest pain. EXAM: CHEST - 2 VIEW COMPARISON:  09/15/2014.  FINDINGS: Mediastinum and hilar structures normal. Lungs are clear. No pleural effusion or pneumothorax. Tiny nodule left upper lung, although this appears new, it appears calcified and is most likely a granuloma. Heart size normal. No acute bony abnormality. IMPRESSION: No acute cardiopulmonary disease. Electronically Signed   By: Marcello Moores  Register   On: 09/26/2019 07:12    Procedures Procedures (including critical care time)  Medications Ordered in ED Medications  sodium chloride flush (NS) 0.9 % injection 3 mL (has no administration in time range)    ED Course  I have reviewed the triage vital signs and the nursing notes.  Pertinent labs & imaging results that were available during my care of the patient were reviewed by me and considered in my medical decision making (see chart for details).    MDM Rules/Calculators/A&P                     49 year old female with history of anxiety/depression, tachycardia, GERD, mitral valve prolapse, nephrolithiasis, hyperlipidemia, Sjogren's syndrome, who presents the emergency department today with multiple complaints. Has been on clinda and mupirocen for impetigo. Concerned for LAD, burning left chest wall pain, and palpitations.  No significant LAD on exam, but would expect that she may have this in setting of recently dx impetigo. No ttp to the left chest wall and no rashes but pt states sxs similar to prior dx of shingles. Cardiac/pulm exam are benign. VS wnl.  CBC, BMP and trop are reassuring. Ddimer ordered in setting of reported tachycardia, recent surgery. Dr. Stark Jock in agreement with this. DDimer is elevated. Will get cta chest.   EKG with NSR, nonspecific twave abnormality  CXR neg.   CTA chest pending at time of shift change. Care transitioned to [facility], PA-C with plan to f/u on CT. If negative, pt can f/u with her cardiologist as an outpatient. She can continue her current medications.    Final Clinical  Impression(s) / ED  Diagnoses Final diagnoses:  None    Rx / DC Orders ED Discharge Orders    None       Bishop Dublin 09/26/19 1533    Veryl Speak, MD 09/28/19 0800

## 2019-09-26 NOTE — ED Notes (Signed)
Patient verbalizes understanding of discharge instructions. Opportunity for questioning and answers were provided. Armband removed by staff, pt discharged from ED ambulatory.   

## 2019-09-26 NOTE — ED Triage Notes (Addendum)
Patient reports palpitations with SOB onset last night , no cough or fever . Currently taking Clindamycin for nasal impetigo, her cardiologist is Dr. Katherine Roan.

## 2019-09-26 NOTE — Discharge Instructions (Addendum)
Your CT scan showed a concern for pneumonia vs a pulmonary nodule. Please take the doxycycline that you were prescribed last week as this will cover for a pneumonia and discontinue the clindamycin. Continue using the bactroban ointment.   It is recommended that you  have a repeat CT scan of your chest in 4-6 weeks for further evaluation after treatment of possible pneumonia to ensure the nodule has decreased in size. Please follow up with your PCP regarding this.   We have swabbed you for COVID 19 prior to discharging today - please stay at home and self isolate until you receive your results. IF positive you will receive a call. You will not receive a call if you test negative; please log into mychart to see your results. If positive you will need to self isolate for 10 days and are not cleared to resume normal daily activity until: 10/06/2018.

## 2019-09-26 NOTE — ED Provider Notes (Signed)
Care assumed from [facility], [place], at shift change, please see their notes for full documentation of patient's complaint/HPI. Briefly, pt here with palpitations and left sided chest wall pain. Results so far show elevated D dimer. Awaiting CTA. Plan is to discharge if negative.   Physical Exam  BP 128/71 (BP Location: Left Arm)   Pulse 92   Temp 98.3 F (36.8 C) (Oral)   Resp 14   LMP 07/03/2018   SpO2 97%   Physical Exam  ED Course/Procedures   Clinical Course as of Sep 26 1643  Tue Sep 26, 2019  1617 CT Angio Chest PE W and/or Wo Contrast [MV]    Clinical Course User Index [MV] Eustaquio Maize, PA-C    Procedures  IMPRESSION:  1. No demonstrable pulmonary embolus. No thoracic aortic aneurysm or  dissection.    2. Focal airspace opacity in the axillary subsegment of the right  upper lobe which has a somewhat nodular appearance on coronal and  sagittal images, measuring 1.2 x 0.9 cm. While this area most likely  represents pneumonia, its nodular appearance warrants additional  surveillance. In this regard, repeat CT after 4-6 weeks following  treatment for suspected pneumonia would be advised. If this area or  to have a continued nodular appearance after treatment for suspected  pneumonia, correlation with nuclear medicine PET study may be  reasonable. There are several calcifications in this area, likely  intrafissural lymph nodes. Lungs elsewhere clear.    3. No adenopathy by size criteria evident.    4. Gallbladder absent.    MDM  CTA without signs of PE or dissection today. There is an airspace opacity in the right lung which appears nodular in appearance; concern for pneumonia vs other etiology. Pt without an elevated WBC nor pneumonia on CXR. Pt reports she has an occasional cough but attributes it to post nasal drip from allergies. She does mention that she is currently being treated for cellulitis vs impetigo of the nose with clindamycin. She was  originally prescribed doxycycline but it was changed to clinda per her request; pt reports her PCP meant to cancel the doxy prescription but the pharmacy filled it regardless so she picked it up. Discussed case with attending physician Dr. Sherry Ruffing who suggests treating as pneumonia with repeat CT scan in 4-6 weeks as recommended by radiologist. Given pt already have 100 mg Doxy BID x 5 days at home I have recommended that she begin taking this instead of the clindamycin and D/C the clindamycin. Continue using bactroban for her impetigo. Pt is in agreement with plan at this time. We will swab for covid prior to discharge as well; pt advised to stay at home until she receives her results. Stable for discharge home.   This note was prepared using Dragon voice recognition software and may include unintentional dictation errors due to the inherent limitations of voice recognition software.    Discharge Instructions     Your CT scan showed a concern for pneumonia vs a pulmonary nodule. Please take the doxycycline that you were prescribed last week as this will cover for a pneumonia and discontinue the clindamycin. Continue using the bactroban ointment.   It is recommended that you  have a repeat CT scan of your chest in 4-6 weeks for further evaluation after treatment of possible pneumonia to ensure the nodule has decreased in size. Please follow up with your PCP regarding this.   We have swabbed you for COVID 19 prior to discharging today -  please stay at home and self isolate until you receive your results. IF positive you will receive a call. You will not receive a call if you test negative; please log into mychart to see your results. If positive you will need to self isolate for 10 days and are not cleared to resume normal daily activity until: 10/06/2018.             Eustaquio Maize, PA-C 09/26/19 1646    Tegeler, Gwenyth Allegra, MD 09/26/19 [phone]

## 2019-09-27 LAB — NOVEL CORONAVIRUS, NAA (HOSP ORDER, SEND-OUT TO REF LAB; TAT 18-24 HRS): SARS-CoV-2, NAA: NOT DETECTED

## 2019-09-29 ENCOUNTER — Other Ambulatory Visit: Payer: Self-pay | Admitting: Family Medicine

## 2019-09-29 ENCOUNTER — Encounter: Payer: Self-pay | Admitting: Family Medicine

## 2019-09-29 DIAGNOSIS — R911 Solitary pulmonary nodule: Secondary | ICD-10-CM

## 2019-09-29 LAB — MRSA CULTURE
MICRO NUMBER:: 10028946
SPECIMEN QUALITY:: ADEQUATE

## 2019-09-30 ENCOUNTER — Other Ambulatory Visit: Payer: Self-pay | Admitting: Family Medicine

## 2019-09-30 DIAGNOSIS — F329 Major depressive disorder, single episode, unspecified: Secondary | ICD-10-CM

## 2019-09-30 DIAGNOSIS — F419 Anxiety disorder, unspecified: Secondary | ICD-10-CM

## 2019-09-30 MED ORDER — ESCITALOPRAM OXALATE 10 MG PO TABS: 10.0000 mg | ORAL_TABLET | Freq: Every day | ORAL | 2 refills | Status: DC

## 2019-10-12 ENCOUNTER — Encounter: Payer: Self-pay | Admitting: Family Medicine

## 2019-10-13 ENCOUNTER — Ambulatory Visit: Payer: BC Managed Care – PPO | Admitting: Family Medicine

## 2019-10-13 ENCOUNTER — Encounter: Payer: Self-pay | Admitting: Family Medicine

## 2019-10-13 ENCOUNTER — Other Ambulatory Visit: Payer: Self-pay

## 2019-10-13 VITALS — BP 104/64 | HR 78 | Temp 97.6°F | Ht 67.0 in | Wt 164.1 lb

## 2019-10-13 DIAGNOSIS — M545 Low back pain, unspecified: Secondary | ICD-10-CM

## 2019-10-13 LAB — POCT URINALYSIS DIPSTICK
Bilirubin, UA: NEGATIVE
Blood, UA: NEGATIVE
Glucose, UA: NEGATIVE
Ketones, UA: NEGATIVE
Leukocytes, UA: NEGATIVE
Nitrite, UA: NEGATIVE
Protein, UA: NEGATIVE
Spec Grav, UA: <=1.005 — AB (ref 1.010–1.025)
Urobilinogen, UA: 0.2 E.U./dL
pH, UA: 6.5 (ref 5.0–8.0)

## 2019-10-13 MED ORDER — PANTOPRAZOLE SODIUM 40 MG PO TBEC: 40.0000 mg | DELAYED_RELEASE_TABLET | Freq: Every day | ORAL | 2 refills | Status: DC

## 2019-10-13 NOTE — Progress Notes (Signed)
Subjective:    Patient ID: Jill Leonard, female    DOB: Jan 17, 1971, 49 y.o.   MRN: [id number]  HPI Chief Complaint  Patient presents with  . Back Pain    lower back, bilateral, all week  Has noticed for about 1 week, seems worse at night. Pain is achy, on both flanks. No worse with movement. Pain is constant today, prior was more intermittent. No known trauma or overuse. Has been drinking more fluids. No radiation into buttocks, no weakness. Normal bowel movements. Has not taken anything or tried heat/ice. No hematuria, no dysuria, no frequency. She had ventral hernia repair 06/12/19 and feels like she has had decreased abdominal muscle strength.   Doing better with meds- started escitalopram 10 mg a couple of weeks ago, has noticed decreased anxiety and improved sleep.     Review of Systems Per HPI    Objective:   Physical Exam Vitals reviewed.  Constitutional:      General: She is not in acute distress.    Appearance: Normal appearance. She is normal weight. She is not ill-appearing, toxic-appearing or diaphoretic.  HENT:     Head: Normocephalic and atraumatic.  Eyes:     Conjunctiva/sclera: Conjunctivae normal.  Cardiovascular:     Rate and Rhythm: Normal rate and regular rhythm.     Heart sounds: Normal heart sounds.  Pulmonary:     Effort: Pulmonary effort is normal.     Breath sounds: Normal breath sounds.  Abdominal:     Tenderness: There is no right CVA tenderness or left CVA tenderness.  Musculoskeletal:     Cervical back: Normal, normal range of motion and neck supple. No tenderness.     Thoracic back: Normal.     Lumbar back: No swelling, deformity, tenderness or bony tenderness. Normal range of motion. Negative right straight leg raise test and negative left straight leg raise test.     Comments: Pain with rotation.   Skin:    General: Skin is warm and dry.  Neurological:     Mental Status: She is alert and oriented to person, place, and time.     Motor:  No weakness.     Gait: Gait normal.     Deep Tendon Reflexes: Reflexes normal.  Psychiatric:        Mood and Affect: Mood normal.        Behavior: Behavior normal.        Thought Content: Thought content normal.        Judgment: Judgment normal.       BP 104/64   Pulse 78   Temp 97.6 F (36.4 C)   Ht 5\' 7"  (1.702 m)   Wt 164 lb 1.6 oz (74.4 kg)   LMP 07/03/2018   SpO2 99%   BMI 25.70 kg/m      Results for orders placed or performed in visit on 10/13/19  POCT urinalysis dipstick  Result Value Ref Range   Color, UA yellow    Clarity, UA clear    Glucose, UA Negative Negative   Bilirubin, UA neg    Ketones, UA neg    Spec Grav, UA <=1.005 (A) 1.010 - 1.025   Blood, UA neg    pH, UA 6.5 5.0 - 8.0   Protein, UA Negative Negative   Urobilinogen, UA 0.2 0.2 or 1.0 E.U./dL   Nitrite, UA neg    Leukocytes, UA Negative Negative   Appearance     Odor      Assessment &  Plan:  1. Acute bilateral low back pain without sciatica - POCT urinalysis dipstick - Provided written and verbal information regarding diagnosis and treatment. - she is not one for medication, discussed taking 2 Tylenol at bedtime, heat/ ice - she was given back exercises  - RTC precautions reveiwed  Clarene Reamer, FNP-BC  [facility], [place]  10/16/2019 8:31 AM

## 2019-10-13 NOTE — Patient Instructions (Signed)
Good to see you today  Try two tylenol at bedtime   Back Exercises These exercises help to make your trunk and back strong. They also help to keep the lower back flexible. Doing these exercises can help to prevent back pain or lessen existing pain.  If you have back pain, try to do these exercises 2-3 times each day or as told by your doctor.  As you get better, do the exercises once each day. Repeat the exercises more often as told by your doctor.  To stop back pain from coming back, do the exercises once each day, or as told by your doctor. Exercises Single knee to chest Do these steps 3-5 times in a row for each leg: 1. Lie on your back on a firm bed or the floor with your legs stretched out. 2. Bring one knee to your chest. 3. Grab your knee or thigh with both hands and hold them it in place. 4. Pull on your knee until you feel a gentle stretch in your lower back or buttocks. 5. Keep doing the stretch for 10-30 seconds. 6. Slowly let go of your leg and straighten it. Pelvic tilt Do these steps 5-10 times in a row: 1. Lie on your back on a firm bed or the floor with your legs stretched out. 2. Bend your knees so they point up to the ceiling. Your feet should be flat on the floor. 3. Tighten your lower belly (abdomen) muscles to press your lower back against the floor. This will make your tailbone point up to the ceiling instead of pointing down to your feet or the floor. 4. Stay in this position for 5-10 seconds while you gently tighten your muscles and breathe evenly. Cat-cow Do these steps until your lower back bends more easily: 1. Get on your hands and knees on a firm surface. Keep your hands under your shoulders, and keep your knees under your hips. You may put padding under your knees. 2. Let your head hang down toward your chest. Tighten (contract) the muscles in your belly. Point your tailbone toward the floor so your lower back becomes rounded like the back of a  cat. 3. Stay in this position for 5 seconds. 4. Slowly lift your head. Let the muscles of your belly relax. Point your tailbone up toward the ceiling so your back forms a sagging arch like the back of a cow. 5. Stay in this position for 5 seconds.  Press-ups Do these steps 5-10 times in a row: 1. Lie on your belly (face-down) on the floor. 2. Place your hands near your head, about shoulder-width apart. 3. While you keep your back relaxed and keep your hips on the floor, slowly straighten your arms to raise the top half of your body and lift your shoulders. Do not use your back muscles. You may change where you place your hands in order to make yourself more comfortable. 4. Stay in this position for 5 seconds. 5. Slowly return to lying flat on the floor.  Bridges Do these steps 10 times in a row: 1. Lie on your back on a firm surface. 2. Bend your knees so they point up to the ceiling. Your feet should be flat on the floor. Your arms should be flat at your sides, next to your body. 3. Tighten your butt muscles and lift your butt off the floor until your waist is almost as high as your knees. If you do not feel the muscles working in  your butt and the back of your thighs, slide your feet 1-2 inches farther away from your butt. 4. Stay in this position for 3-5 seconds. 5. Slowly lower your butt to the floor, and let your butt muscles relax. If this exercise is too easy, try doing it with your arms crossed over your chest. Belly crunches Do these steps 5-10 times in a row: 1. Lie on your back on a firm bed or the floor with your legs stretched out. 2. Bend your knees so they point up to the ceiling. Your feet should be flat on the floor. 3. Cross your arms over your chest. 4. Tip your chin a little bit toward your chest but do not bend your neck. 5. Tighten your belly muscles and slowly raise your chest just enough to lift your shoulder blades a tiny bit off of the floor. Avoid raising your  body higher than that, because it can put too much stress on your low back. 6. Slowly lower your chest and your head to the floor. Back lifts Do these steps 5-10 times in a row: 1. Lie on your belly (face-down) with your arms at your sides, and rest your forehead on the floor. 2. Tighten the muscles in your legs and your butt. 3. Slowly lift your chest off of the floor while you keep your hips on the floor. Keep the back of your head in line with the curve in your back. Look at the floor while you do this. 4. Stay in this position for 3-5 seconds. 5. Slowly lower your chest and your face to the floor. Contact a doctor if:  Your back pain gets a lot worse when you do an exercise.  Your back pain does not get better 2 hours after you exercise. If you have any of these problems, stop doing the exercises. Do not do them again unless your doctor says it is okay. Get help right away if:  You have sudden, very bad back pain. If this happens, stop doing the exercises. Do not do them again unless your doctor says it is okay. This information is not intended to replace advice given to you by your health care provider. Make sure you discuss any questions you have with your health care provider. Document Revised: 05/26/2018 Document Reviewed: 05/26/2018 Elsevier Patient Education  2020 [employer].

## 2019-10-16 ENCOUNTER — Encounter: Payer: Self-pay | Admitting: Family Medicine

## 2019-10-17 ENCOUNTER — Other Ambulatory Visit: Payer: Self-pay

## 2019-10-17 DIAGNOSIS — R002 Palpitations: Secondary | ICD-10-CM

## 2019-10-17 MED ORDER — METOPROLOL SUCCINATE ER 25 MG PO TB24: 12.5000 mg | ORAL_TABLET | Freq: Every day | ORAL | 3 refills | Status: DC

## 2019-10-27 ENCOUNTER — Other Ambulatory Visit: Payer: Self-pay

## 2019-10-27 ENCOUNTER — Ambulatory Visit
Admission: RE | Admit: 2019-10-27 | Discharge: 2019-10-27 | Disposition: A | Discharge status: Home / Self Care | Payer: BC Managed Care – PPO | Source: Ambulatory Visit | Attending: Family Medicine | Admitting: Family Medicine

## 2019-10-27 DIAGNOSIS — R918 Other nonspecific abnormal finding of lung field: Secondary | ICD-10-CM | POA: Diagnosis not present

## 2019-10-27 DIAGNOSIS — R911 Solitary pulmonary nodule: Secondary | ICD-10-CM

## 2019-10-27 MED ORDER — IOPAMIDOL (ISOVUE-300) INJECTION 61%
100.0000 mL | Freq: Once | INTRAVENOUS | Status: AC | PRN
  Administered 2019-10-27: 14:00:00 75 mL via INTRAVENOUS

## 2019-10-30 ENCOUNTER — Telehealth: Payer: Self-pay | Admitting: Internal Medicine

## 2019-10-30 NOTE — Telephone Encounter (Signed)
Received a new pt referral from Clarene Reamer, [place]. Ms Urschel has been cld and scheduled to see Dr. Julien Nordmann on 2/22 at 2:15pm w/labs at 1:45pm. Pt aware to arrive 15 minutes early.

## 2019-11-03 ENCOUNTER — Other Ambulatory Visit: Payer: Self-pay | Admitting: *Deleted

## 2019-11-03 DIAGNOSIS — D492 Neoplasm of unspecified behavior of bone, soft tissue, and skin: Secondary | ICD-10-CM

## 2019-11-03 DIAGNOSIS — R911 Solitary pulmonary nodule: Secondary | ICD-10-CM

## 2019-11-06 ENCOUNTER — Other Ambulatory Visit: Payer: Self-pay | Admitting: Family Medicine

## 2019-11-06 ENCOUNTER — Inpatient Hospital Stay: Payer: BC Managed Care – PPO | Attending: Internal Medicine | Admitting: Internal Medicine

## 2019-11-06 ENCOUNTER — Encounter: Payer: Self-pay | Admitting: Family Medicine

## 2019-11-06 ENCOUNTER — Other Ambulatory Visit: Payer: Self-pay

## 2019-11-06 ENCOUNTER — Inpatient Hospital Stay: Payer: BC Managed Care – PPO

## 2019-11-06 ENCOUNTER — Encounter: Payer: Self-pay | Admitting: Internal Medicine

## 2019-11-06 VITALS — BP 126/83 | HR 96 | Temp 98.5°F | Resp 18 | Ht 68.5 in | Wt 159.9 lb

## 2019-11-06 DIAGNOSIS — M3500 Sicca syndrome, unspecified: Secondary | ICD-10-CM | POA: Insufficient documentation

## 2019-11-06 DIAGNOSIS — R911 Solitary pulmonary nodule: Secondary | ICD-10-CM | POA: Diagnosis not present

## 2019-11-06 DIAGNOSIS — Z803 Family history of malignant neoplasm of breast: Secondary | ICD-10-CM | POA: Diagnosis not present

## 2019-11-06 DIAGNOSIS — Z79899 Other long term (current) drug therapy: Secondary | ICD-10-CM | POA: Diagnosis not present

## 2019-11-06 DIAGNOSIS — F329 Major depressive disorder, single episode, unspecified: Secondary | ICD-10-CM

## 2019-11-06 DIAGNOSIS — F419 Anxiety disorder, unspecified: Secondary | ICD-10-CM | POA: Diagnosis not present

## 2019-11-06 DIAGNOSIS — E042 Nontoxic multinodular goiter: Secondary | ICD-10-CM | POA: Diagnosis not present

## 2019-11-06 DIAGNOSIS — R918 Other nonspecific abnormal finding of lung field: Secondary | ICD-10-CM | POA: Diagnosis not present

## 2019-11-06 LAB — CMP (CANCER CENTER ONLY)
ALT: 19 U/L (ref 0–44)
AST: 23 U/L (ref 15–41)
Albumin: 4.1 g/dL (ref 3.5–5.0)
Alkaline Phosphatase: 58 U/L (ref 38–126)
Anion gap: 9 (ref 5–15)
BUN: 6 mg/dL (ref 6–20)
CO2: 29 mmol/L (ref 22–32)
Calcium: 9.1 mg/dL (ref 8.9–10.3)
Chloride: 105 mmol/L (ref 98–111)
Creatinine: 0.79 mg/dL (ref 0.44–1.00)
GFR, Est AFR Am: >60 mL/min (ref >60)
GFR, Estimated: >60 mL/min (ref >60)
Glucose, Bld: 111 mg/dL — ABNORMAL HIGH (ref 70–99)
Potassium: 3.7 mmol/L (ref 3.5–5.1)
Sodium: 143 mmol/L (ref 135–145)
Total Bilirubin: 0.3 mg/dL (ref 0.3–1.2)
Total Protein: 8.3 g/dL — ABNORMAL HIGH (ref 6.5–8.1)

## 2019-11-06 LAB — CBC WITH DIFFERENTIAL (CANCER CENTER ONLY)
Abs Immature Granulocytes: 0.02 10*3/uL (ref 0.00–0.07)
Basophils Absolute: 0 10*3/uL (ref 0.0–0.1)
Basophils Relative: 1 %
Eosinophils Absolute: 0 10*3/uL (ref 0.0–0.5)
Eosinophils Relative: 1 %
HCT: 43.6 % (ref 36.0–46.0)
Hemoglobin: 14.4 g/dL (ref 12.0–15.0)
Immature Granulocytes: 0 %
Lymphocytes Relative: 32 %
Lymphs Abs: 1.7 10*3/uL (ref 0.7–4.0)
MCH: 27.6 pg (ref 26.0–34.0)
MCHC: 33 g/dL (ref 30.0–36.0)
MCV: 83.5 fL (ref 80.0–100.0)
Monocytes Absolute: 0.5 10*3/uL (ref 0.1–1.0)
Monocytes Relative: 9 %
Neutro Abs: 3.1 10*3/uL (ref 1.7–7.7)
Neutrophils Relative %: 57 %
Platelet Count: 300 10*3/uL (ref 150–400)
RBC: 5.22 MIL/uL — ABNORMAL HIGH (ref 3.87–5.11)
RDW: 13.4 % (ref 11.5–15.5)
WBC Count: 5.4 10*3/uL (ref 4.0–10.5)
nRBC: 0 % (ref 0.0–0.2)

## 2019-11-06 NOTE — Progress Notes (Signed)
[facility] [phone]   Fax:([phone]) [phone]  CONSULT NOTE  REFERRING PHYSICIAN: Clarene Reamer, FNP  REASON FOR CONSULTATION:  49 years old white female with suspicious right lung nodule  HPI Jill Leonard is a 49 y.o. female with past medical history significant for GERD, fibromyalgia, anxiety, Sjogren's syndrome, depression, dyslipidemia, history of kidney stones as well as migraine and anal fissure.  The patient is a non-smoker and was undergoing treatment for impetigo of the lips and nose.  She presented to the emergency department on September 26, 2019 complaining of left-sided chest pain as well as shortness of breath.  Chest x-ray at that time was unremarkable but CT angiogram of the chest performed on the same day showed no evidence for pulmonary embolism.  Incidentally there was focal airspace opacity in the axillary subsegment of the right upper lobe which she has somewhat nodular appearance on coronal and sagittal images measuring 1.2 x 0.9 cm.  This area was suspicious for pneumonia but the nodular appearance were not additional surveillance.  The patient was seen by her primary care provider Ms. Carlean Purl and she had repeat CT scan of the chest with contrast on October 27, 2019 and it showed stable 0.6 x 2.0 x 1.2 cm nodular opacity within the posterior lateral superior aspect of the right middle lobe.  There was no mediastinal mass or enlarged lymph nodes identified. The patient was referred to me today for evaluation and recommendation regarding her condition. When seen today she is very anxious about her condition.  She is also under a lot of stress taking care of her mom who was recently diagnosed with a stroke and had cardiac arrest and defibrillator placement.  The patient denied having any current chest pain but has shortness of breath with exertion with no cough or hemoptysis.  She denied having any recent weight loss or night sweats.  She has  intermittent nausea with no vomiting, abdominal pain, diarrhea or constipation.  She denied having any headache or visual changes. Family history significant for father with colon polyps, mother recently diagnosed with a stroke and had cardiac arrest but survived it.  Maternal grandmother had breast cancer. The patient is married and has 1 son age 67.  She works in a Adult nurse.  She has no history of smoking.  She drinks alcohol occasionally and no history of drug abuse.  HPI  Past Medical History:  Diagnosis Date  . Anal fissure   . Arthralgia of temporomandibular joint    MIGRAINES AND REGULAR  . Calculus of gallbladder without mention of cholecystitis or obstruction   . Complication of anesthesia   . Depression   . Dysrhythmia    RAPID HEARTBEAT AND SKIPPED BEATS ON METOPROLOL  . Family history of adverse reaction to anesthesia    FAMILY MEMBERS HAVE PONV  . Fibromyalgia   . GERD (gastroesophageal reflux disease)   . Heart murmur    IN PAST, TOLD IS GONE NOW  . History of kidney stones   . History of nephrolithiasis   . HLD (hyperlipidemia)   . PONV (postoperative nausea and vomiting)   . Sjogren - Larsson's syndrome   . Sjogren's syndrome [facility])     Past Surgical History:  Procedure Laterality Date  . ABDOMINAL HYSTERECTOMY    . CHOLECYSTECTOMY  08/20/08  . MANDIBLE SURGERY    . VENTRAL HERNIA REPAIR N/A 06/12/2019   Procedure: VENTRAL HERNIA REPAIR WITH MESH;  Surgeon: Jovita Kussmaul, MD;  Location: [facility] OR;  Service: General;  Laterality: N/A;  . WISDOM TOOTH EXTRACTION      Family History  Problem Relation Age of Onset  . Colon polyps Father   . Bone cancer Other        Grandmother  . Diabetes Other        Grandmother  . Heart disease Other        Grandmother    Social History Social History   Tobacco Use  . Smoking status: Never Smoker  . Smokeless tobacco: Never Used  Substance Use Topics  . Alcohol use: Yes    Alcohol/week: 0.0 standard drinks      Comment: Socially-rare  . Drug use: No    Allergies  Allergen Reactions  . Albuterol Other (See Comments)    REACTION: intolerant- "jittery" after use  . Sulfonamide Derivatives Other (See Comments)    REACTION: intolerant  . Trazodone And Nefazodone Other (See Comments)    Jittery and anxious and difficulty sleeping.  . Latex Rash and Other (See Comments)    Blisters    Current Outpatient Medications  Medication Sig Dispense Refill  . Ascorbic Acid (VITAMIN C) 1000 MG tablet Take 1,000 mg by mouth daily.    Marland Kitchen aspirin-acetaminophen-caffeine (EXCEDRIN MIGRAINE) 250-250-65 MG tablet Take 1 tablet by mouth every 8 (eight) hours as needed for headache or migraine.    . cholecalciferol (VITAMIN D) 1000 UNITS tablet Take 1,000 Units by mouth every evening.     . diphenhydramine-acetaminophen (TYLENOL PM) 25-500 MG TABS tablet Take 1 tablet by mouth at bedtime.     Marland Kitchen escitalopram (LEXAPRO) 10 MG tablet Take 1 tablet (10 mg total) by mouth daily. 30 tablet 2  . fluticasone (FLONASE) 50 MCG/ACT nasal spray Place 2 sprays into both nostrils daily as needed for allergies or rhinitis.    . metoprolol succinate (TOPROL-XL) 25 MG 24 hr tablet Take 0.5 tablets (12.5 mg total) by mouth daily. 45 tablet 3  . mupirocin ointment (BACTROBAN) 2 % Apply 1 application topically 3 (three) times daily. 22 g 1  . pantoprazole (PROTONIX) 40 MG tablet Take 1 tablet (40 mg total) by mouth daily. 90 tablet 2  . Probiotic Product (PROBIOTIC DAILY PO) Take 1 capsule by mouth every evening.     . vitamin B-12 (CYANOCOBALAMIN) 1000 MCG tablet Take 1,000 mcg by mouth every evening.      No current facility-administered medications for this visit.    Review of Systems  Constitutional: positive for fatigue Eyes: negative Ears, nose, mouth, throat, and face: negative Respiratory: positive for dyspnea on exertion Cardiovascular: negative Gastrointestinal: negative Genitourinary:negative Integument/breast:  negative Hematologic/lymphatic: negative Musculoskeletal:negative Neurological: negative Behavioral/Psych: positive for anxiety Endocrine: negative Allergic/Immunologic: negative  Physical Exam  UYQ:IHKVQ, healthy, no distress, well nourished, well developed and anxious SKIN: skin color, texture, turgor are normal, no rashes or significant lesions HEAD: Normocephalic, No masses, lesions, tenderness or abnormalities EYES: normal, PERRLA, Conjunctiva are pink and non-injected EARS: External ears normal, Canals clear OROPHARYNX:no exudate, no erythema and lips, buccal mucosa, and tongue normal  NECK: supple, no adenopathy, no JVD LYMPH:  no palpable lymphadenopathy, no hepatosplenomegaly BREAST:not examined LUNGS: clear to auscultation , and palpation HEART: regular rate & rhythm, no murmurs and no gallops ABDOMEN:abdomen soft, non-tender, normal bowel sounds and no masses or organomegaly BACK: No CVA tenderness, Range of motion is normal EXTREMITIES:no joint deformities, effusion, or inflammation, no edema  NEURO: alert & oriented x 3 with fluent speech, no focal motor/sensory deficits  PERFORMANCE STATUS: ECOG 1  LABORATORY DATA: Lab Results  Component Value Date   WBC 4.6 09/26/2019   HGB 14.2 09/26/2019   HCT 43.1 09/26/2019   MCV 84.5 09/26/2019   PLT 300 09/26/2019      Chemistry      Component Value Date/Time   NA 141 09/26/2019 0644   K 3.7 09/26/2019 0644   CL 108 09/26/2019 0644   CO2 25 09/26/2019 0644   BUN 8 09/26/2019 0644   CREATININE 0.87 09/26/2019 0644      Component Value Date/Time   CALCIUM 9.4 09/26/2019 0644   ALKPHOS 65 07/18/2019 0851   AST 33 07/18/2019 0851   ALT 28 07/18/2019 0851   BILITOT 0.5 07/18/2019 0851       RADIOGRAPHIC STUDIES: CT Chest W Contrast  Result Date: 10/27/2019 CLINICAL DATA:  49 year old female for follow-up of RIGHT lung opacity/nodule. EXAM: CT CHEST WITH CONTRAST TECHNIQUE: Multidetector CT imaging of the  chest was performed during intravenous contrast administration. CONTRAST:  81mL ISOVUE-300 IOPAMIDOL (ISOVUE-300) INJECTION 61% COMPARISON:  09/26/2019 CT FINDINGS: Cardiovascular: No significant vascular findings. Normal heart size. No pericardial effusion. Mediastinum/Nodes: Multiple thyroid nodules are unchanged, the largest measuring 1 cm. No mediastinal mass or enlarged lymph nodes identified. The visualized esophagus and trachea are unremarkable. Lungs/Pleura: A stable 0.6 x 2 x 1.2 cm nodular opacity (AP x transverse x CC) within the posterolaterosuperior aspect of the RIGHT middle lobe noted. A few calcifications INFERIOR and MEDIAL to this nodular opacity is identified. No other pulmonary nodule, mass, airspace disease, consolidation, pleural effusion or pneumothorax identified. Upper Abdomen: No acute abnormality. Musculoskeletal: No acute or suspicious bony abnormalities identified. IMPRESSION: 1. Stable 0.6 x 2 x 1.2 cm RIGHT middle lobe opacity for 1 month. Given no improvement in 1 month and no symptoms of infection, consider one of the following in 3 months for both low-risk and high-risk individuals: (a) repeat chest CT, (b) follow-up PET-CT, or (c) tissue sampling. This recommendation follows the consensus statement: Guidelines for Management of Incidental Pulmonary Nodules Detected on CT Images: From the Fleischner Society 2017; Radiology 2017; 284:228-243. Electronically Signed   By: Margarette Canada M.D.   On: 10/27/2019 16:24    ASSESSMENT: This is a very pleasant 49 years old never smoker white female with suspicious right middle lobe lung opacity, likely inflammatory in origin but underlying malignancy could not be completely excluded. The patient is very anxious about her condition.  PLAN: I had a lengthy discussion with the patient today about her current condition and further investigation to confirm diagnosis. I personally and independently reviewed the scan images and discussed the  results and showed the images to the patient today. This area is concerning for malignancy but it could be inflammatory in origin too. I gave the patient the option of continuous observation and monitoring with repeat CT scan of the chest in 6-8 weeks versus proceeding with a PET scan to see if the nodule has any hypermetabolic activity that warrant further investigation and referral to surgery. With the high anxiety level the patient does not want to wait for repeat scan in 6 to 8 weeks. I ordered a PET scan to be performed next week and then I will see the patient back for follow-up visit in 2 weeks for discussion of her PET scan results and recommendation regarding her condition. The patient was advised to call immediately if she has any other concerning symptoms in the interval. The patient voices understanding of current disease  status and treatment options and is in agreement with the current care plan.  All questions were answered. The patient knows to call the clinic with any problems, questions or concerns. We can certainly see the patient much sooner if necessary.  Thank you so much for allowing me to participate in the care of [place]. I will continue to follow up the patient with you and assist in her care.  The total time spent in the appointment was 60 minutes.  Disclaimer: This note was dictated with voice recognition software. Similar sounding words can inadvertently be transcribed and may not be corrected upon review.   Eilleen Kempf November 06, 2019, 2:03 PM

## 2019-11-07 ENCOUNTER — Telehealth: Payer: Self-pay | Admitting: Internal Medicine

## 2019-11-07 ENCOUNTER — Ambulatory Visit: Payer: BC Managed Care – PPO | Admitting: Family Medicine

## 2019-11-07 ENCOUNTER — Encounter: Payer: Self-pay | Admitting: Family Medicine

## 2019-11-07 DIAGNOSIS — F411 Generalized anxiety disorder: Secondary | ICD-10-CM

## 2019-11-07 MED ORDER — HYDROXYZINE HCL 10 MG PO TABS: 10.0000 mg | ORAL_TABLET | Freq: Four times a day (QID) | ORAL | 0 refills | Status: DC | PRN

## 2019-11-07 NOTE — Progress Notes (Signed)
Chief Complaint  Patient presents with  . Panic Attack    History of Present Illness: HPI    49 year old female pt of Jill Leonard's presents with increased anxiety and panick attacks.   She reports she has had worsening anxiety in last  Few weeks. She finds herself obsessing over hear health.  Having menopausal symptoms.. hot flashes.    Off and on she has been waking up at night.. chest pressure, tightness, warm tingling sensation in chest to neck. Increase heart rate   Occ. Using metoprolol 1/2 tab at ngiht for SVT per cardiology.  Cardiac eval in ER in past negative.   Last night worst ever. Could not calm herself for 1 hour.   She reports she is under a lot of stress, taking care of her Mom who recently had a CVA/cardiac arrest.  SHe is also undergoing work up of a suspicious right middle lobe opacity with oncology Dr. Earlie Server. Note from 11/06/2019 reviewed. Plans further eval with PET scan. Reviewed pt MyChart conversation yesterday with her PCP.   She is on lexapro 10 mg daily. Tor Netters recommended increase to 15 mg then 20 mg. Also recommended hydroxyzine for panic attacks and sleep.    GAD 7: 18 PHq9 15  This visit occurred during the SARS-CoV-2 public health emergency.  Safety protocols were in place, including screening questions prior to the visit, additional usage of staff PPE, and extensive cleaning of exam room while observing appropriate contact time as indicated for disinfecting solutions.   COVID 19 screen:  No recent travel or known exposure to COVID19 The patient denies respiratory symptoms of COVID 19 at this time. The importance of social distancing was discussed today.     Review of Systems  Constitutional: Negative for chills and fever.  HENT: Negative for congestion and ear pain.   Eyes: Negative for pain and redness.  Respiratory: Negative for cough and shortness of breath.   Cardiovascular: Negative for chest pain, palpitations and leg  swelling.  Gastrointestinal: Negative for abdominal pain, blood in stool, constipation, diarrhea, nausea and vomiting.  Genitourinary: Negative for dysuria.  Musculoskeletal: Negative for falls and myalgias.  Skin: Negative for rash.  Neurological: Negative for dizziness.  Psychiatric/Behavioral: The patient is nervous/anxious.       Past Medical History:  Diagnosis Date  . Anal fissure   . Arthralgia of temporomandibular joint    MIGRAINES AND REGULAR  . Calculus of gallbladder without mention of cholecystitis or obstruction   . Complication of anesthesia   . Depression   . Dysrhythmia    RAPID HEARTBEAT AND SKIPPED BEATS ON METOPROLOL  . Family history of adverse reaction to anesthesia    FAMILY MEMBERS HAVE PONV  . Fibromyalgia   . GERD (gastroesophageal reflux disease)   . Heart murmur    IN PAST, TOLD IS GONE NOW  . History of kidney stones   . History of nephrolithiasis   . HLD (hyperlipidemia)   . PONV (postoperative nausea and vomiting)   . Sjogren - Larsson's syndrome   . Sjogren's syndrome ([place])     reports that she has never smoked. She has never used smokeless tobacco. She reports current alcohol use. She reports that she does not use drugs.   Current Outpatient Medications:  .  Ascorbic Acid (VITAMIN C) 1000 MG tablet, Take 1,000 mg by mouth daily., Disp: , Rfl:  .  aspirin-acetaminophen-caffeine (EXCEDRIN MIGRAINE) 250-250-65 MG tablet, Take 1 tablet by mouth every 8 (eight) hours as  needed for headache or migraine., Disp: , Rfl:  .  cholecalciferol (VITAMIN D) 1000 UNITS tablet, Take 1,000 Units by mouth every evening. , Disp: , Rfl:  .  diphenhydramine-acetaminophen (TYLENOL PM) 25-500 MG TABS tablet, Take 1 tablet by mouth at bedtime. , Disp: , Rfl:  .  escitalopram (LEXAPRO) 10 MG tablet, Take 1 tablet (10 mg total) by mouth daily., Disp: 30 tablet, Rfl: 2 .  fluticasone (FLONASE) 50 MCG/ACT nasal spray, Place 2 sprays into both nostrils daily as needed for  allergies or rhinitis., Disp: , Rfl:  .  metoprolol succinate (TOPROL-XL) 25 MG 24 hr tablet, Take 0.5 tablets (12.5 mg total) by mouth daily., Disp: 45 tablet, Rfl: 3 .  pantoprazole (PROTONIX) 40 MG tablet, Take 1 tablet (40 mg total) by mouth daily., Disp: 90 tablet, Rfl: 2 .  Probiotic Product (PROBIOTIC DAILY PO), Take 1 capsule by mouth every evening. , Disp: , Rfl:  .  vitamin B-12 (CYANOCOBALAMIN) 1000 MCG tablet, Take 1,000 mcg by mouth every evening. , Disp: , Rfl:    Observations/Objective: Blood pressure 110/82, pulse 99, temperature 99.4 F (37.4 C), temperature source Temporal, height 5\' 7"  (1.702 m), weight 158 lb 8 oz (71.9 kg), last menstrual period 07/03/2018, SpO2 97 %.  Physical Exam Constitutional:      General: She is not in acute distress.    Appearance: Normal appearance. She is well-developed. She is not ill-appearing or toxic-appearing.  HENT:     Head: Normocephalic.     Right Ear: Hearing, tympanic membrane, ear canal and external ear normal. Tympanic membrane is not erythematous, retracted or bulging.     Left Ear: Hearing, tympanic membrane, ear canal and external ear normal. Tympanic membrane is not erythematous, retracted or bulging.     Nose: No mucosal edema or rhinorrhea.     Right Sinus: No maxillary sinus tenderness or frontal sinus tenderness.     Left Sinus: No maxillary sinus tenderness or frontal sinus tenderness.     Mouth/Throat:     Pharynx: Uvula midline.  Eyes:     General: Lids are normal. Lids are everted, no foreign bodies appreciated.     Conjunctiva/sclera: Conjunctivae normal.     Pupils: Pupils are equal, round, and reactive to light.  Neck:     Thyroid: No thyroid mass or thyromegaly.     Vascular: No carotid bruit.     Trachea: Trachea normal.  Cardiovascular:     Rate and Rhythm: Normal rate and regular rhythm.     Pulses: Normal pulses.     Heart sounds: Normal heart sounds, S1 normal and S2 normal. No murmur. No friction rub.  No gallop.   Pulmonary:     Effort: Pulmonary effort is normal. No tachypnea or respiratory distress.     Breath sounds: Normal breath sounds. No decreased breath sounds, wheezing, rhonchi or rales.  Abdominal:     General: Bowel sounds are normal.     Palpations: Abdomen is soft.     Tenderness: There is no abdominal tenderness.  Musculoskeletal:     Cervical back: Normal range of motion and neck supple.  Skin:    General: Skin is warm and dry.     Findings: No rash.  Neurological:     Mental Status: She is alert.  Psychiatric:        Mood and Affect: Mood is not anxious or depressed.        Speech: Speech normal.  Behavior: Behavior normal. Behavior is cooperative.        Thought Content: Thought content normal.        Judgment: Judgment normal.      Assessment and Plan Generalized anxiety disorder Use atarax as needed for anxiety  Increase lexapro to 20 mg daily for better long term control.  Pt reassured no clear other ongoing issues.   Close follow up with PCP.        Eliezer Lofts, MD

## 2019-11-07 NOTE — Patient Instructions (Signed)
Use atarax as needed for anxiety as discussed.  Increase lexapro as discussed.

## 2019-11-07 NOTE — Telephone Encounter (Signed)
Scheduled per los. Called and spoke with patient. Confirmed appt 

## 2019-11-13 ENCOUNTER — Other Ambulatory Visit: Payer: Self-pay | Admitting: Family Medicine

## 2019-11-13 DIAGNOSIS — F329 Major depressive disorder, single episode, unspecified: Secondary | ICD-10-CM

## 2019-11-13 DIAGNOSIS — F32A Depression, unspecified: Secondary | ICD-10-CM

## 2019-11-13 MED ORDER — ESCITALOPRAM OXALATE 20 MG PO TABS: 20.0000 mg | ORAL_TABLET | Freq: Every day | ORAL | 1 refills | Status: DC

## 2019-11-14 ENCOUNTER — Ambulatory Visit: Payer: BC Managed Care – PPO | Admitting: Cardiovascular Disease

## 2019-11-14 ENCOUNTER — Encounter: Payer: Self-pay | Admitting: Cardiovascular Disease

## 2019-11-14 ENCOUNTER — Other Ambulatory Visit: Payer: Self-pay

## 2019-11-14 VITALS — BP 124/86 | HR 88 | Temp 95.5°F | Ht 67.0 in | Wt 157.0 lb

## 2019-11-14 DIAGNOSIS — I059 Rheumatic mitral valve disease, unspecified: Secondary | ICD-10-CM

## 2019-11-14 DIAGNOSIS — R002 Palpitations: Secondary | ICD-10-CM

## 2019-11-14 NOTE — Patient Instructions (Signed)
Medication Instructions:  No changes *If you need a refill on your cardiac medications before your next appointment, please call your pharmacy*   Lab Work: None ordered If you have labs (blood work) drawn today and your tests are completely normal, you will receive your results only by: Marland Kitchen MyChart Message (if you have MyChart) OR . A paper copy in the mail If you have any lab test that is abnormal or we need to change your treatment, we will call you to review the results.   Testing/Procedures: None ordered   Follow-Up: At [facility], you and your health needs are our priority.  As part of our continuing mission to provide you with exceptional heart care, we have created designated Provider Care Teams.  These Care Teams include your primary Cardiologist (physician) and Advanced Practice Providers (APPs -  Physician Assistants and Nurse Practitioners) who all work together to provide you with the care you need, when you need it.  We recommend signing up for the patient portal called "MyChart".  Sign up information is provided on this After Visit Summary.  MyChart is used to connect with patients for Virtual Visits (Telemedicine).  Patients are able to view lab/test results, encounter notes, upcoming appointments, etc.  Non-urgent messages can be sent to your provider as well.   To learn more about what you can do with MyChart, go to [email or web address].    Your next appointment:   12 month(s)  The format for your next appointment:   In Person  Provider:   Kathlyn Sacramento, MD

## 2019-11-14 NOTE — Progress Notes (Signed)
Cardiology Office Note   Date:  11/14/2019   ID:  Jill Leonard, DOB April 27, 1971, MRN [id number]  PCP:  Elby Beck, FNP  Cardiologist:   Kathlyn Sacramento, MD   No chief complaint on file.     History of Present Illness: Jill Leonard is a 49 y.o. female who presents for a follow-up visit regarding palpitations and tachycardia. She has known history of Sjogren's syndrome.  Echocardiogram in January 2016 showed normal LV systolic function with an ejection fraction of 60% with borderline prolapse of anterior mitral valve leaflet with only mild mitral regurgitation and no evidence of pulmonary hypertension. A Holter monitor showed normal sinus rhythm with occasional PVCs and PACs. She had a total of 445 PVCs and 1200 PACs. A treadmill stress test in June of 2016 was normal. she was started on small dose Toprol with improvement in palpitations. She also cut down on caffeine intake.  She had a repeat echocardiogram in October 2019 which showed normal LV systolic function with mild mitral valve prolapse and trivial regurgitation.  She reports worsening palpitations in the setting of significant increase in stress and anxiety.  She had hernia surgery in September and the recovery was harder than what she expected.  In addition, she was found to have right middle lobe lung opacity most likely inflammatory in origin but underlying malignancy cannot be completely excluded.  She was seen by oncology and she is going to have a PET scan done.  Also her mother had a recent stroke was diagnosed with severe cardiomyopathy.   Past Medical History:  Diagnosis Date  . Anal fissure   . Arthralgia of temporomandibular joint    MIGRAINES AND REGULAR  . Calculus of gallbladder without mention of cholecystitis or obstruction   . Complication of anesthesia   . Depression   . Dysrhythmia    RAPID HEARTBEAT AND SKIPPED BEATS ON METOPROLOL  . Family history of adverse reaction to anesthesia    FAMILY MEMBERS HAVE PONV  . Fibromyalgia   . GERD (gastroesophageal reflux disease)   . Heart murmur    IN PAST, TOLD IS GONE NOW  . History of kidney stones   . History of nephrolithiasis   . HLD (hyperlipidemia)   . PONV (postoperative nausea and vomiting)   . Sjogren - Larsson's syndrome   . Sjogren's syndrome [facility])     Past Surgical History:  Procedure Laterality Date  . ABDOMINAL HYSTERECTOMY    . CHOLECYSTECTOMY  08/20/08  . MANDIBLE SURGERY    . VENTRAL HERNIA REPAIR N/A 06/12/2019   Procedure: VENTRAL HERNIA REPAIR WITH MESH;  Surgeon: Jovita Kussmaul, MD;  Location: [facility];  Service: General;  Laterality: N/A;  . WISDOM TOOTH EXTRACTION       Current Outpatient Medications  Medication Sig Dispense Refill  . Ascorbic Acid (VITAMIN C) 1000 MG tablet Take 1,000 mg by mouth daily.    Marland Kitchen aspirin-acetaminophen-caffeine (EXCEDRIN MIGRAINE) 250-250-65 MG tablet Take 1 tablet by mouth every 8 (eight) hours as needed for headache or migraine.    . cholecalciferol (VITAMIN D) 1000 UNITS tablet Take 1,000 Units by mouth every evening.     . diphenhydramine-acetaminophen (TYLENOL PM) 25-500 MG TABS tablet Take 1 tablet by mouth at bedtime.     Marland Kitchen escitalopram (LEXAPRO) 20 MG tablet Take 1 tablet (20 mg total) by mouth daily. 90 tablet 1  . fluticasone (FLONASE) 50 MCG/ACT nasal spray Place 2 sprays into both nostrils daily as needed for  allergies or rhinitis.    . hydrOXYzine (ATARAX/VISTARIL) 10 MG tablet Take 1 tablet (10 mg total) by mouth every 6 (six) hours as needed for anxiety. 30 tablet 0  . metoprolol succinate (TOPROL-XL) 25 MG 24 hr tablet Take 0.5 tablets (12.5 mg total) by mouth daily. 45 tablet 3  . pantoprazole (PROTONIX) 40 MG tablet Take 1 tablet (40 mg total) by mouth daily. 90 tablet 2  . Probiotic Product (PROBIOTIC DAILY PO) Take 1 capsule by mouth every evening.     . vitamin B-12 (CYANOCOBALAMIN) 1000 MCG tablet Take 1,000 mcg by mouth every evening.      No current  facility-administered medications for this visit.    Allergies:   Albuterol, Sulfonamide derivatives, Trazodone and nefazodone, and Latex    Social History:  The patient  reports that she has never smoked. She has never used smokeless tobacco. She reports current alcohol use. She reports that she does not use drugs.   Family History:  The patient's family history includes Bone cancer in an other family member; Colon polyps in her father; Diabetes in an other family member; Heart disease in an other family member.    ROS:  Please see the history of present illness.   Otherwise, review of systems are positive for none.   All other systems are reviewed and negative.    PHYSICAL EXAM: VS:  BP 124/86   Pulse 88   Temp (!) 95.5 F (35.3 C)   Ht 5\' 7"  (1.702 m)   Wt 157 lb (71.2 kg)   LMP 07/03/2018   SpO2 98%   BMI 24.59 kg/m  , BMI Body mass index is 24.59 kg/m. GEN: Well nourished, well developed, in no acute distress  HEENT: normal  Neck: no JVD, carotid bruits, or masses Cardiac: RRR; no murmurs, rubs, or gallops,no edema  Respiratory:  clear to auscultation bilaterally, normal work of breathing GI: soft, nontender, nondistended, + BS MS: no deformity or atrophy  Skin: warm and dry, no rash Neuro:  Strength and sensation are intact Psych: euthymic mood, full affect   EKG:  EKG is ordered today. EKG showed normal sinus rhythm with no significant ST or T wave changes.   Recent Labs: 07/16/2019: TSH 1.370 07/18/2019: Magnesium 2.0 11/06/2019: ALT 19; BUN 6; Creatinine 0.79; Hemoglobin 14.4; Platelet Count 300; Potassium 3.7; Sodium 143    Lipid Panel    Component Value Date/Time   CHOL 200 06/10/2018 0837   TRIG 152.0 (H) 06/10/2018 0837   HDL 48.20 06/10/2018 0837   CHOLHDL 4 06/10/2018 0837   VLDL 30.4 06/10/2018 0837   LDLCALC 121 (H) 06/10/2018 0837      Wt Readings from Last 3 Encounters:  11/14/19 157 lb (71.2 kg)  11/07/19 158 lb 8 oz (71.9 kg)  11/06/19  159 lb 14.4 oz (72.5 kg)       No flowsheet data found.    ASSESSMENT AND PLAN:  1.  Palpitations due to PVCs and PACs: Overall well controlled with small dose of Toprol.  Recent worsening of symptoms likely due to stress and anxiety.  I discussed with her mechanisms of improving that.  I do think we need to order more testing or change management.  2.  Borderline mitral valve prolapse with mild mitral regurgitation.  No murmur by physical exam.  Echocardiogram in 2019 showed only trivial regurgitation.  3.  Panic attacks and anxiety.  She is on Lexapro with partial improvement in symptoms.   Disposition:  FU with me in 1 year  Signed,  Kathlyn Sacramento, MD  11/14/2019 10:06 AM    [place]

## 2019-11-15 ENCOUNTER — Ambulatory Visit (HOSPITAL_COMMUNITY)
Admission: RE | Admit: 2019-11-15 | Discharge: 2019-11-15 | Disposition: A | Discharge status: Home / Self Care | Payer: BC Managed Care – PPO | Source: Ambulatory Visit | Attending: Internal Medicine | Admitting: Internal Medicine

## 2019-11-15 DIAGNOSIS — R911 Solitary pulmonary nodule: Secondary | ICD-10-CM | POA: Insufficient documentation

## 2019-11-15 LAB — GLUCOSE, CAPILLARY: Glucose-Capillary: 86 mg/dL (ref 70–99)

## 2019-11-15 MED ORDER — FLUDEOXYGLUCOSE F - 18 (FDG) INJECTION
7.7600 | Freq: Once | INTRAVENOUS | Status: AC
  Administered 2019-11-15: 7.76 via INTRAVENOUS

## 2019-11-16 ENCOUNTER — Ambulatory Visit (HOSPITAL_COMMUNITY): Admission: RE | Admit: 2019-11-16 | Payer: BC Managed Care – PPO | Source: Ambulatory Visit

## 2019-11-17 ENCOUNTER — Other Ambulatory Visit (INDEPENDENT_AMBULATORY_CARE_PROVIDER_SITE_OTHER): Payer: BC Managed Care – PPO

## 2019-11-17 DIAGNOSIS — R002 Palpitations: Secondary | ICD-10-CM | POA: Diagnosis not present

## 2019-11-20 ENCOUNTER — Other Ambulatory Visit: Payer: Self-pay

## 2019-11-20 ENCOUNTER — Encounter: Payer: Self-pay | Admitting: *Deleted

## 2019-11-20 ENCOUNTER — Inpatient Hospital Stay: Payer: BC Managed Care – PPO | Attending: Internal Medicine | Admitting: Internal Medicine

## 2019-11-20 ENCOUNTER — Encounter: Payer: Self-pay | Admitting: Internal Medicine

## 2019-11-20 VITALS — BP 127/82 | HR 73 | Temp 98.7°F | Resp 18 | Ht 67.0 in | Wt 158.7 lb

## 2019-11-20 DIAGNOSIS — M797 Fibromyalgia: Secondary | ICD-10-CM | POA: Diagnosis not present

## 2019-11-20 DIAGNOSIS — F329 Major depressive disorder, single episode, unspecified: Secondary | ICD-10-CM | POA: Insufficient documentation

## 2019-11-20 DIAGNOSIS — R918 Other nonspecific abnormal finding of lung field: Secondary | ICD-10-CM | POA: Insufficient documentation

## 2019-11-20 DIAGNOSIS — E785 Hyperlipidemia, unspecified: Secondary | ICD-10-CM | POA: Diagnosis not present

## 2019-11-20 DIAGNOSIS — C3490 Malignant neoplasm of unspecified part of unspecified bronchus or lung: Secondary | ICD-10-CM

## 2019-11-20 DIAGNOSIS — R911 Solitary pulmonary nodule: Secondary | ICD-10-CM

## 2019-11-20 DIAGNOSIS — E042 Nontoxic multinodular goiter: Secondary | ICD-10-CM | POA: Diagnosis not present

## 2019-11-20 DIAGNOSIS — Z79899 Other long term (current) drug therapy: Secondary | ICD-10-CM | POA: Insufficient documentation

## 2019-11-20 NOTE — Progress Notes (Signed)
[facility] Telephone:(336) [phone]   Fax:(336) [phone]  OFFICE PROGRESS NOTE  Elby Beck, [place] [address]  DIAGNOSIS: Right lower lobe lung nodule, likely inflammatory.  PRIOR THERAPY: None  CURRENT THERAPY: None  INTERVAL HISTORY: Jill Leonard 49 y.o. female returns to the clinic today for follow-up visit.  The patient is feeling fine today with no concerning complaints.  She denied having any current chest pain, shortness of breath, cough or hemoptysis.  She denied having any fever or chills.  She has no nausea, vomiting, diarrhea or constipation.  She denied having any headache or visual changes.  She was found on previous CT scan of the chest to have suspicious right lower lobe lung nodule.  The patient had a PET scan performed recently and she is here for evaluation and discussion of her PET scan results and recommendation regarding her condition.  MEDICAL HISTORY: Past Medical History:  Diagnosis Date  . Anal fissure   . Arthralgia of temporomandibular joint    MIGRAINES AND REGULAR  . Calculus of gallbladder without mention of cholecystitis or obstruction   . Complication of anesthesia   . Depression   . Dysrhythmia    RAPID HEARTBEAT AND SKIPPED BEATS ON METOPROLOL  . Family history of adverse reaction to anesthesia    FAMILY MEMBERS HAVE PONV  . Fibromyalgia   . GERD (gastroesophageal reflux disease)   . Heart murmur    IN PAST, TOLD IS GONE NOW  . History of kidney stones   . History of nephrolithiasis   . HLD (hyperlipidemia)   . PONV (postoperative nausea and vomiting)   . Sjogren - Larsson's syndrome   . Sjogren's syndrome ([place])     ALLERGIES:  is allergic to albuterol; sulfonamide derivatives; trazodone and nefazodone; and latex.  MEDICATIONS:  Current Outpatient Medications  Medication Sig Dispense Refill  . Ascorbic Acid (VITAMIN C) 1000 MG tablet Take 1,000 mg by mouth daily.    Marland Kitchen  aspirin-acetaminophen-caffeine (EXCEDRIN MIGRAINE) 250-250-65 MG tablet Take 1 tablet by mouth every 8 (eight) hours as needed for headache or migraine.    . cholecalciferol (VITAMIN D) 1000 UNITS tablet Take 1,000 Units by mouth every evening.     . diphenhydramine-acetaminophen (TYLENOL PM) 25-500 MG TABS tablet Take 1 tablet by mouth at bedtime.     Marland Kitchen escitalopram (LEXAPRO) 20 MG tablet Take 1 tablet (20 mg total) by mouth daily. 90 tablet 1  . fluticasone (FLONASE) 50 MCG/ACT nasal spray Place 2 sprays into both nostrils daily as needed for allergies or rhinitis.    . hydrOXYzine (ATARAX/VISTARIL) 10 MG tablet Take 1 tablet (10 mg total) by mouth every 6 (six) hours as needed for anxiety. 30 tablet 0  . metoprolol succinate (TOPROL-XL) 25 MG 24 hr tablet Take 0.5 tablets (12.5 mg total) by mouth daily. 45 tablet 3  . pantoprazole (PROTONIX) 40 MG tablet Take 1 tablet (40 mg total) by mouth daily. 90 tablet 2  . Probiotic Product (PROBIOTIC DAILY PO) Take 1 capsule by mouth every evening.     . vitamin B-12 (CYANOCOBALAMIN) 1000 MCG tablet Take 1,000 mcg by mouth every evening.      No current facility-administered medications for this visit.    SURGICAL HISTORY:  Past Surgical History:  Procedure Laterality Date  . ABDOMINAL HYSTERECTOMY    . CHOLECYSTECTOMY  08/20/08  . MANDIBLE SURGERY    . VENTRAL HERNIA REPAIR N/A 06/12/2019  Procedure: VENTRAL HERNIA REPAIR WITH MESH;  Surgeon: Jovita Kussmaul, MD;  Location: [facility];  Service: General;  Laterality: N/A;  . WISDOM TOOTH EXTRACTION      REVIEW OF SYSTEMS:  A comprehensive review of systems was negative.   PHYSICAL EXAMINATION: General appearance: alert, cooperative and no distress Head: Normocephalic, without obvious abnormality, atraumatic Neck: no adenopathy, no JVD, supple, symmetrical, trachea midline and thyroid not enlarged, symmetric, no tenderness/mass/nodules Lymph nodes: Cervical, supraclavicular, and axillary nodes  normal. Resp: clear to auscultation bilaterally Back: symmetric, no curvature. ROM normal. No CVA tenderness. Cardio: regular rate and rhythm, S1, S2 normal, no murmur, click, rub or gallop GI: soft, non-tender; bowel sounds normal; no masses,  no organomegaly Extremities: extremities normal, atraumatic, no cyanosis or edema  ECOG PERFORMANCE STATUS: 1 - Symptomatic but completely ambulatory  Blood pressure 127/82, pulse 73, temperature 98.7 F (37.1 C), temperature source Temporal, resp. rate 18, height 5\' 7"  (1.702 m), weight 158 lb 11.2 oz (72 kg), last menstrual period 07/03/2018, SpO2 98 %.  LABORATORY DATA: Lab Results  Component Value Date   WBC 5.4 11/06/2019   HGB 14.4 11/06/2019   HCT 43.6 11/06/2019   MCV 83.5 11/06/2019   PLT 300 11/06/2019      Chemistry      Component Value Date/Time   NA 143 11/06/2019 1345   K 3.7 11/06/2019 1345   CL 105 11/06/2019 1345   CO2 29 11/06/2019 1345   BUN 6 11/06/2019 1345   CREATININE 0.79 11/06/2019 1345      Component Value Date/Time   CALCIUM 9.1 11/06/2019 1345   ALKPHOS 58 11/06/2019 1345   AST 23 11/06/2019 1345   ALT 19 11/06/2019 1345   BILITOT 0.3 11/06/2019 1345       RADIOGRAPHIC STUDIES: CT Chest W Contrast  Result Date: 10/27/2019 CLINICAL DATA:  49 year old female for follow-up of RIGHT lung opacity/nodule. EXAM: CT CHEST WITH CONTRAST TECHNIQUE: Multidetector CT imaging of the chest was performed during intravenous contrast administration. CONTRAST:  38mL ISOVUE-300 IOPAMIDOL (ISOVUE-300) INJECTION 61% COMPARISON:  09/26/2019 CT FINDINGS: Cardiovascular: No significant vascular findings. Normal heart size. No pericardial effusion. Mediastinum/Nodes: Multiple thyroid nodules are unchanged, the largest measuring 1 cm. No mediastinal mass or enlarged lymph nodes identified. The visualized esophagus and trachea are unremarkable. Lungs/Pleura: A stable 0.6 x 2 x 1.2 cm nodular opacity (AP x transverse x CC) within  the posterolaterosuperior aspect of the RIGHT middle lobe noted. A few calcifications INFERIOR and MEDIAL to this nodular opacity is identified. No other pulmonary nodule, mass, airspace disease, consolidation, pleural effusion or pneumothorax identified. Upper Abdomen: No acute abnormality. Musculoskeletal: No acute or suspicious bony abnormalities identified. IMPRESSION: 1. Stable 0.6 x 2 x 1.2 cm RIGHT middle lobe opacity for 1 month. Given no improvement in 1 month and no symptoms of infection, consider one of the following in 3 months for both low-risk and high-risk individuals: (a) repeat chest CT, (b) follow-up PET-CT, or (c) tissue sampling. This recommendation follows the consensus statement: Guidelines for Management of Incidental Pulmonary Nodules Detected on CT Images: From the Fleischner Society 2017; Radiology 2017; 284:228-243. Electronically Signed   By: Margarette Canada M.D.   On: 10/27/2019 16:24   NM PET Image Initial (PI) Skull Base To Thigh  Result Date: 11/15/2019 CLINICAL DATA:  Initial treatment strategy for solitary pulmonary nodule. EXAM: NUCLEAR MEDICINE PET SKULL BASE TO THIGH TECHNIQUE: 7.8 mCi F-18 FDG was injected intravenously. Full-ring PET imaging was performed from the skull  base to thigh after the radiotracer. CT data was obtained and used for attenuation correction and anatomic localization. Fasting blood glucose: 86 mg/dl COMPARISON:  CT chest dated 10/27/2019.  CTA chest dated 09/26/2019. FINDINGS: Mediastinal blood pool activity: SUV max 2.4 Liver activity: SUV max NA NECK: No hypermetabolic cervical lymphadenopathy. 11 mm right thyroid nodule (series 4/image 43), max SUV 10.3. 9 mm left inferior thyroid nodule (series 4/image 47), max SUV 5.4. Incidental CT findings: none CHEST: No hypermetabolic thoracic lymphadenopathy. 6 x 10 mm nodule in the anterolateral aspect of the right lower lobe (series 8/image 35), with adjacent parenchymal opacity laterally and calcifications  medially, but without hypermetabolism (max SUV 1.5). Incidental CT findings: none ABDOMEN/PELVIS: No hypermetabolic abdominopelvic lymphadenopathy. No abnormal hypermetabolism in the liver, spleen, pancreas, or adrenal glands. Postsurgical changes along the midline anterior abdominal wall. Mild residual hypermetabolism, max SUV 3.7 (PET image 124), without soft tissue lesion on CT. Incidental CT findings: Geographic hepatic steatosis in the right hepatic lobe. Prior cholecystectomy. Status post hysterectomy. SKELETON: No focal hypermetabolic activity to suggest skeletal metastasis. Incidental CT findings: none IMPRESSION: 10 mm right lower lobe nodule is non FDG avid, favoring post infectious/inflammatory scarring. Consider follow-up CT chest in 6 months to document stability. Bilateral thyroid nodules, measuring up to 11 mm on the right, which do not meet size criteria for biopsy but demonstrate hypermetabolism on PET. Recommend thyroid US and biopsy. (Ref: J Am Coll Radiol. 2015 Feb;12(2): 143-50). Electronically Signed   By: Julian Hy M.D.   On: 11/15/2019 12:37    ASSESSMENT AND PLAN: This is a very pleasant 49 years old white female with suspicious right lower lobe pulmonary nodule likely inflammatory in origin. The patient had a recent PET scan performed.  I personally and independently reviewed the scan images and discussed the results with the patient today.  The right lower lobe pulmonary nodule did not show any hypermetabolic activity and likely inflammatory in origin but repeat CT scan in 6 months was recommended.  She also had bilateral thyroid nodules that may need further evaluation with ultrasound. I recommended for the patient to come back for follow-up visit in 6 months with repeat CT scan of the chest. For the thyroid nodule she will discuss with her primary care physician ordering ultrasound of the thyroid to rule out any concerning abnormalities. The patient was advised to call  immediately if she has any concerning symptoms in the interval. The patient voices understanding of current disease status and treatment options and is in agreement with the current care plan.  All questions were answered. The patient knows to call the clinic with any problems, questions or concerns. We can certainly see the patient much sooner if necessary.  Disclaimer: This note was dictated with voice recognition software. Similar sounding words can inadvertently be transcribed and may not be corrected upon review.

## 2019-11-20 NOTE — Progress Notes (Signed)
Oncology Nurse Navigator Documentation  Oncology Nurse Navigator Flowsheets 11/20/2019  Navigator Location [facility]  Patient Visit Type MedOnc/I spoke with patient today at her visit with Dr. Julien Nordmann.  Her treatment plan is observation with follow up ct in 6 months.  I helped to explain plan of care.  She verbalized understanding.   Barriers/Navigation Needs Education  Education Other  Interventions Education  Acuity Level 2-Minimal Needs (1-2 Barriers Identified)  Education Method Verbal  Time Spent with Patient 15

## 2019-11-20 NOTE — Assessment & Plan Note (Addendum)
Use atarax as needed for anxiety  Increase lexapro to 20 mg daily for better long term control.  Pt reassured no clear other ongoing issues.   Close follow up with PCP.

## 2019-11-22 ENCOUNTER — Telehealth: Payer: Self-pay | Admitting: Internal Medicine

## 2019-11-22 NOTE — Telephone Encounter (Signed)
Scheduled per los. Called and left msg. Mailed printout  °

## 2019-11-27 ENCOUNTER — Encounter: Payer: Self-pay | Admitting: Family Medicine

## 2019-11-27 ENCOUNTER — Other Ambulatory Visit: Payer: Self-pay | Admitting: Family Medicine

## 2019-11-27 DIAGNOSIS — E042 Nontoxic multinodular goiter: Secondary | ICD-10-CM

## 2019-11-27 DIAGNOSIS — E041 Nontoxic single thyroid nodule: Secondary | ICD-10-CM

## 2019-11-27 NOTE — Progress Notes (Signed)
Per hem-onc, needs thyroid US to evaluate nodules seen on CT.

## 2019-12-05 ENCOUNTER — Ambulatory Visit
Admission: RE | Admit: 2019-12-05 | Discharge: 2019-12-05 | Disposition: A | Discharge status: Home / Self Care | Payer: BC Managed Care – PPO | Source: Ambulatory Visit | Attending: Family Medicine | Admitting: Family Medicine

## 2019-12-05 DIAGNOSIS — E041 Nontoxic single thyroid nodule: Secondary | ICD-10-CM | POA: Diagnosis not present

## 2019-12-06 NOTE — Addendum Note (Signed)
Addended by: Clarene Reamer B on: 12/06/2019 02:07 PM   Modules accepted: Orders

## 2019-12-07 ENCOUNTER — Other Ambulatory Visit: Payer: Self-pay

## 2019-12-07 ENCOUNTER — Other Ambulatory Visit (INDEPENDENT_AMBULATORY_CARE_PROVIDER_SITE_OTHER): Payer: BC Managed Care – PPO

## 2019-12-07 DIAGNOSIS — E042 Nontoxic multinodular goiter: Secondary | ICD-10-CM | POA: Diagnosis not present

## 2019-12-07 LAB — T4, FREE: Free T4: 0.94 ng/dL (ref 0.60–1.60)

## 2019-12-07 LAB — T3: T3, Total: 138 ng/dL (ref 76–181)

## 2019-12-07 LAB — TSH: TSH: 0.91 u[IU]/mL (ref 0.35–4.50)

## 2019-12-25 DIAGNOSIS — H52203 Unspecified astigmatism, bilateral: Secondary | ICD-10-CM | POA: Diagnosis not present

## 2019-12-25 DIAGNOSIS — H538 Other visual disturbances: Secondary | ICD-10-CM | POA: Diagnosis not present

## 2019-12-25 DIAGNOSIS — H04123 Dry eye syndrome of bilateral lacrimal glands: Secondary | ICD-10-CM | POA: Diagnosis not present

## 2019-12-25 DIAGNOSIS — H524 Presbyopia: Secondary | ICD-10-CM | POA: Diagnosis not present

## 2019-12-25 DIAGNOSIS — H5213 Myopia, bilateral: Secondary | ICD-10-CM | POA: Diagnosis not present

## 2020-01-10 DIAGNOSIS — E042 Nontoxic multinodular goiter: Secondary | ICD-10-CM | POA: Diagnosis not present

## 2020-01-17 ENCOUNTER — Other Ambulatory Visit: Payer: Self-pay | Admitting: General Surgery

## 2020-01-17 DIAGNOSIS — E042 Nontoxic multinodular goiter: Secondary | ICD-10-CM

## 2020-01-19 ENCOUNTER — Other Ambulatory Visit: Payer: Self-pay

## 2020-01-19 ENCOUNTER — Ambulatory Visit: Payer: BC Managed Care – PPO | Admitting: Family Medicine

## 2020-01-19 ENCOUNTER — Encounter: Payer: Self-pay | Admitting: Family Medicine

## 2020-01-19 VITALS — BP 115/78 | HR 75 | Temp 97.6°F | Ht 67.0 in | Wt 160.8 lb

## 2020-01-19 DIAGNOSIS — S6991XA Unspecified injury of right wrist, hand and finger(s), initial encounter: Secondary | ICD-10-CM

## 2020-01-19 MED ORDER — CEPHALEXIN 500 MG PO CAPS: 500.0000 mg | ORAL_CAPSULE | Freq: Three times a day (TID) | ORAL | 0 refills | Status: DC

## 2020-01-19 NOTE — Progress Notes (Signed)
   Subjective:    Patient ID: Jill Leonard, female    DOB: March 19, 1971, 49 y.o.   MRN: [id number]  HPI Chief Complaint  Patient presents with  . Pain    right thumb little swollen, very painful possible infection symptoms x 4 days.   Patient trimmed right thumb nail cuticle several days ago and has noticed throbbing pain and redness. She has applied ice which seemed to make pain increase. No drainage, slightly puffy. Feels better with counter pressure.     Review of Systems Per HPI    Objective:   Physical Exam Vitals reviewed.  Constitutional:      Appearance: Normal appearance. She is normal weight.  HENT:     Head: Normocephalic and atraumatic.  Cardiovascular:     Rate and Rhythm: Normal rate.  Pulmonary:     Effort: Pulmonary effort is normal.  Skin:    General: Skin is warm and dry.     Comments: Right medial area around thumbnail with mild erythema, slight swelling, no fluctuance, no drainage.   Neurological:     Mental Status: She is alert and oriented to person, place, and time.  Psychiatric:        Mood and Affect: Mood normal.        Behavior: Behavior normal.        Thought Content: Thought content normal.        Judgment: Judgment normal.       BP 115/78   Pulse 75   Temp 97.6 F (36.4 C) (Tympanic)   Ht 5\' 7"  (1.702 m)   Wt 160 lb 12.8 oz (72.9 kg)   LMP 07/03/2018   SpO2 98%   BMI 25.18 kg/m  Wt Readings from Last 3 Encounters:  01/19/20 160 lb 12.8 oz (72.9 kg)  11/20/19 158 lb 11.2 oz (72 kg)  11/14/19 157 lb (71.2 kg)       Assessment & Plan:  1. Injury of right thumbnail, initial encounter - discussed warm compresses, soaks, if worsening or no improvement, can start antibiotic- printed prescription provided - cephALEXin (KEFLEX) 500 MG capsule; Take 1 capsule (500 mg total) by mouth 3 (three) times daily.  Dispense: 21 capsule; Refill: 0  This visit occurred during the SARS-CoV-2 public health emergency.  Safety protocols were in  place, including screening questions prior to the visit, additional usage of staff PPE, and extensive cleaning of exam room while observing appropriate contact time as indicated for disinfecting solutions.    Clarene Reamer, FNP-BC  [facility]  01/20/2020 10:51 AM

## 2020-01-20 ENCOUNTER — Encounter: Payer: Self-pay | Admitting: Family Medicine

## 2020-01-30 ENCOUNTER — Other Ambulatory Visit (HOSPITAL_COMMUNITY)
Admission: RE | Admit: 2020-01-30 | Discharge: 2020-01-30 | Disposition: A | Discharge status: Home / Self Care | Payer: BC Managed Care – PPO | Source: Ambulatory Visit | Attending: Physician Assistant | Admitting: Physician Assistant

## 2020-01-30 ENCOUNTER — Ambulatory Visit
Admission: RE | Admit: 2020-01-30 | Discharge: 2020-01-30 | Disposition: A | Discharge status: Home / Self Care | Payer: BC Managed Care – PPO | Source: Ambulatory Visit | Attending: General Surgery | Admitting: General Surgery

## 2020-01-30 DIAGNOSIS — E042 Nontoxic multinodular goiter: Secondary | ICD-10-CM

## 2020-01-30 DIAGNOSIS — E041 Nontoxic single thyroid nodule: Secondary | ICD-10-CM | POA: Diagnosis not present

## 2020-01-31 LAB — CYTOLOGY - NON PAP

## 2020-02-08 DIAGNOSIS — E042 Nontoxic multinodular goiter: Secondary | ICD-10-CM | POA: Diagnosis not present

## 2020-02-22 ENCOUNTER — Encounter (HOSPITAL_COMMUNITY): Payer: Self-pay

## 2020-03-11 ENCOUNTER — Other Ambulatory Visit: Payer: Self-pay | Admitting: Family Medicine

## 2020-03-11 DIAGNOSIS — F419 Anxiety disorder, unspecified: Secondary | ICD-10-CM

## 2020-03-27 ENCOUNTER — Other Ambulatory Visit: Payer: Self-pay | Admitting: Family Medicine

## 2020-03-27 DIAGNOSIS — F419 Anxiety disorder, unspecified: Secondary | ICD-10-CM

## 2020-03-29 NOTE — Telephone Encounter (Signed)
Last refilled 11/13/2019 #90 x 1 refill Last OV 01/19/20 acute  Please advise, thanks.

## 2020-05-17 ENCOUNTER — Telehealth: Payer: Self-pay | Admitting: Family Medicine

## 2020-05-21 ENCOUNTER — Inpatient Hospital Stay: Payer: BC Managed Care – PPO | Attending: Internal Medicine

## 2020-05-21 ENCOUNTER — Other Ambulatory Visit: Payer: Self-pay

## 2020-05-21 ENCOUNTER — Ambulatory Visit (HOSPITAL_COMMUNITY)
Admission: RE | Admit: 2020-05-21 | Discharge: 2020-05-21 | Disposition: A | Discharge status: Home / Self Care | Payer: BC Managed Care – PPO | Source: Ambulatory Visit | Attending: Internal Medicine | Admitting: Internal Medicine

## 2020-05-21 ENCOUNTER — Encounter (HOSPITAL_COMMUNITY): Payer: Self-pay

## 2020-05-21 DIAGNOSIS — F329 Major depressive disorder, single episode, unspecified: Secondary | ICD-10-CM | POA: Diagnosis not present

## 2020-05-21 DIAGNOSIS — R911 Solitary pulmonary nodule: Secondary | ICD-10-CM | POA: Diagnosis not present

## 2020-05-21 DIAGNOSIS — C3490 Malignant neoplasm of unspecified part of unspecified bronchus or lung: Secondary | ICD-10-CM | POA: Diagnosis not present

## 2020-05-21 DIAGNOSIS — M3500 Sicca syndrome, unspecified: Secondary | ICD-10-CM | POA: Insufficient documentation

## 2020-05-21 DIAGNOSIS — I700 Atherosclerosis of aorta: Secondary | ICD-10-CM | POA: Diagnosis not present

## 2020-05-21 DIAGNOSIS — M797 Fibromyalgia: Secondary | ICD-10-CM | POA: Diagnosis not present

## 2020-05-21 DIAGNOSIS — R918 Other nonspecific abnormal finding of lung field: Secondary | ICD-10-CM | POA: Diagnosis not present

## 2020-05-21 DIAGNOSIS — Z79899 Other long term (current) drug therapy: Secondary | ICD-10-CM | POA: Diagnosis not present

## 2020-05-21 LAB — CBC WITH DIFFERENTIAL (CANCER CENTER ONLY)
Abs Immature Granulocytes: 0.01 10*3/uL (ref 0.00–0.07)
Basophils Absolute: 0 10*3/uL (ref 0.0–0.1)
Basophils Relative: 1 %
Eosinophils Absolute: 0.1 10*3/uL (ref 0.0–0.5)
Eosinophils Relative: 2 %
HCT: 43.7 % (ref 36.0–46.0)
Hemoglobin: 14.4 g/dL (ref 12.0–15.0)
Immature Granulocytes: 0 %
Lymphocytes Relative: 36 %
Lymphs Abs: 1.6 10*3/uL (ref 0.7–4.0)
MCH: 28.6 pg (ref 26.0–34.0)
MCHC: 33 g/dL (ref 30.0–36.0)
MCV: 86.7 fL (ref 80.0–100.0)
Monocytes Absolute: 0.4 10*3/uL (ref 0.1–1.0)
Monocytes Relative: 9 %
Neutro Abs: 2.3 10*3/uL (ref 1.7–7.7)
Neutrophils Relative %: 52 %
Platelet Count: 253 10*3/uL (ref 150–400)
RBC: 5.04 MIL/uL (ref 3.87–5.11)
RDW: 13.2 % (ref 11.5–15.5)
WBC Count: 4.5 10*3/uL (ref 4.0–10.5)
nRBC: 0 % (ref 0.0–0.2)

## 2020-05-21 LAB — CMP (CANCER CENTER ONLY)
ALT: 14 U/L (ref 0–44)
AST: 22 U/L (ref 15–41)
Albumin: 3.8 g/dL (ref 3.5–5.0)
Alkaline Phosphatase: 54 U/L (ref 38–126)
Anion gap: 4 — ABNORMAL LOW (ref 5–15)
BUN: 8 mg/dL (ref 6–20)
CO2: 30 mmol/L (ref 22–32)
Calcium: 9.1 mg/dL (ref 8.9–10.3)
Chloride: 106 mmol/L (ref 98–111)
Creatinine: 0.77 mg/dL (ref 0.44–1.00)
GFR, Est AFR Am: >60 mL/min (ref >60)
GFR, Estimated: >60 mL/min (ref >60)
Glucose, Bld: 76 mg/dL (ref 70–99)
Potassium: 3.9 mmol/L (ref 3.5–5.1)
Sodium: 140 mmol/L (ref 135–145)
Total Bilirubin: 0.3 mg/dL (ref 0.3–1.2)
Total Protein: 8.1 g/dL (ref 6.5–8.1)

## 2020-05-21 MED ORDER — IOHEXOL 300 MG/ML  SOLN
75.0000 mL | Freq: Once | INTRAMUSCULAR | Status: AC | PRN
  Administered 2020-05-21: 75 mL via INTRAVENOUS

## 2020-05-21 NOTE — Telephone Encounter (Signed)
Please try and schedule patient for CPE with fasting labs with Debbie.

## 2020-05-23 ENCOUNTER — Other Ambulatory Visit: Payer: Self-pay

## 2020-05-23 ENCOUNTER — Inpatient Hospital Stay: Payer: BC Managed Care – PPO | Admitting: Internal Medicine

## 2020-05-23 ENCOUNTER — Encounter: Payer: Self-pay | Admitting: Family Medicine

## 2020-05-23 ENCOUNTER — Encounter: Payer: Self-pay | Admitting: Internal Medicine

## 2020-05-23 ENCOUNTER — Encounter (HOSPITAL_COMMUNITY): Payer: Self-pay | Admitting: Radiology

## 2020-05-23 VITALS — BP 116/80 | HR 72 | Temp 97.0°F | Resp 18 | Ht 67.0 in | Wt 160.0 lb

## 2020-05-23 DIAGNOSIS — M797 Fibromyalgia: Secondary | ICD-10-CM | POA: Diagnosis not present

## 2020-05-23 DIAGNOSIS — Z79899 Other long term (current) drug therapy: Secondary | ICD-10-CM | POA: Diagnosis not present

## 2020-05-23 DIAGNOSIS — F329 Major depressive disorder, single episode, unspecified: Secondary | ICD-10-CM | POA: Diagnosis not present

## 2020-05-23 DIAGNOSIS — R911 Solitary pulmonary nodule: Secondary | ICD-10-CM

## 2020-05-23 DIAGNOSIS — M3500 Sicca syndrome, unspecified: Secondary | ICD-10-CM | POA: Diagnosis not present

## 2020-05-23 NOTE — Progress Notes (Signed)
[facility] [phone]   Fax:(336) [phone]  OFFICE PROGRESS NOTE  Elby Beck, FNP [place] [address]  DIAGNOSIS: Persistent right lower lobe lung nodule suspicious for inflammatory process versus neoplasm  PRIOR THERAPY: None  CURRENT THERAPY: None  INTERVAL HISTORY: Jill Leonard 49 y.o. female returns to the clinic today for 6 months follow-up visit.  The patient is feeling fine today with no concerning complaints.  She denied having any current chest pain, shortness of breath, cough or hemoptysis.  She denied having any fever or chills.  She has no nausea, vomiting, diarrhea or constipation.  She has no headache or visual changes.  She was found to have a suspicious lesion in the right upper lobe on previous CT scan of the chest.  This was evaluated with a PET scan 6 months ago and that showed no hypermetabolic activity.  The patient is currently on observation.  She had repeat CT scan of the chest performed recently and she is here for evaluation and discussion of her scan results and treatment options.  MEDICAL HISTORY: Past Medical History:  Diagnosis Date   Anal fissure    Arthralgia of temporomandibular joint    MIGRAINES AND REGULAR   Calculus of gallbladder without mention of cholecystitis or obstruction    Complication of anesthesia    Depression    Dysrhythmia    RAPID HEARTBEAT AND SKIPPED BEATS ON METOPROLOL   Family history of adverse reaction to anesthesia    FAMILY MEMBERS HAVE PONV   Fibromyalgia    GERD (gastroesophageal reflux disease)    Heart murmur    IN PAST, TOLD IS GONE NOW   History of kidney stones    History of nephrolithiasis    HLD (hyperlipidemia)    PONV (postoperative nausea and vomiting)    Sjogren - Larsson's syndrome    Sjogren's syndrome ([facility])     ALLERGIES:  is allergic to albuterol, sulfonamide derivatives, trazodone and nefazodone, and  latex.  MEDICATIONS:  Current Outpatient Medications  Medication Sig Dispense Refill   Ascorbic Acid (VITAMIN C) 1000 MG tablet Take 1,000 mg by mouth daily.     aspirin-acetaminophen-caffeine (EXCEDRIN MIGRAINE) 250-250-65 MG tablet Take 1 tablet by mouth every 8 (eight) hours as needed for headache or migraine.     cephALEXin (KEFLEX) 500 MG capsule Take 1 capsule (500 mg total) by mouth 3 (three) times daily. 21 capsule 0   cholecalciferol (VITAMIN D) 1000 UNITS tablet Take 1,000 Units by mouth every evening.      diphenhydramine-acetaminophen (TYLENOL PM) 25-500 MG TABS tablet Take 1 tablet by mouth at bedtime.      escitalopram (LEXAPRO) 20 MG tablet TAKE 1 TABLET BY MOUTH  DAILY 90 tablet 3   fluticasone (FLONASE) 50 MCG/ACT nasal spray Place 2 sprays into both nostrils daily as needed for allergies or rhinitis.     hydrOXYzine (ATARAX/VISTARIL) 10 MG tablet Take 1 tablet (10 mg total) by mouth every 6 (six) hours as needed for anxiety. 30 tablet 0   metoprolol succinate (TOPROL-XL) 25 MG 24 hr tablet Take 0.5 tablets (12.5 mg total) by mouth daily. 45 tablet 3   pantoprazole (PROTONIX) 40 MG tablet TAKE 1 TABLET BY MOUTH  DAILY 90 tablet 0   Probiotic Product (PROBIOTIC DAILY PO) Take 1 capsule by mouth every evening.      vitamin B-12 (CYANOCOBALAMIN) 1000 MCG tablet Take 1,000 mcg by mouth every evening.  No current facility-administered medications for this visit.    SURGICAL HISTORY:  Past Surgical History:  Procedure Laterality Date   ABDOMINAL HYSTERECTOMY     CHOLECYSTECTOMY  08/20/08   MANDIBLE SURGERY     VENTRAL HERNIA REPAIR N/A 06/12/2019   Procedure: VENTRAL HERNIA REPAIR WITH MESH;  Surgeon: Jovita Kussmaul, MD;  Location: [facility];  Service: General;  Laterality: N/A;   WISDOM TOOTH EXTRACTION      REVIEW OF SYSTEMS:  A comprehensive review of systems was negative.   PHYSICAL EXAMINATION: General appearance: alert, cooperative and no  distress Head: Normocephalic, without obvious abnormality, atraumatic Neck: no adenopathy, no JVD, supple, symmetrical, trachea midline and thyroid not enlarged, symmetric, no tenderness/mass/nodules Lymph nodes: Cervical, supraclavicular, and axillary nodes normal. Resp: clear to auscultation bilaterally Back: symmetric, no curvature. ROM normal. No CVA tenderness. Cardio: regular rate and rhythm, S1, S2 normal, no murmur, click, rub or gallop GI: soft, non-tender; bowel sounds normal; no masses,  no organomegaly Extremities: extremities normal, atraumatic, no cyanosis or edema  ECOG PERFORMANCE STATUS: 0 - Asymptomatic  Blood pressure 116/80, pulse 72, temperature (!) 97 F (36.1 C), temperature source Tympanic, resp. rate 18, height 5\' 7"  (1.702 m), weight 160 lb (72.6 kg), last menstrual period 07/03/2018, SpO2 100 %.  LABORATORY DATA: Lab Results  Component Value Date   WBC 4.5 05/21/2020   HGB 14.4 05/21/2020   HCT 43.7 05/21/2020   MCV 86.7 05/21/2020   PLT 253 05/21/2020      Chemistry      Component Value Date/Time   NA 140 05/21/2020 1313   K 3.9 05/21/2020 1313   CL 106 05/21/2020 1313   CO2 30 05/21/2020 1313   BUN 8 05/21/2020 1313   CREATININE 0.77 05/21/2020 1313      Component Value Date/Time   CALCIUM 9.1 05/21/2020 1313   ALKPHOS 54 05/21/2020 1313   AST 22 05/21/2020 1313   ALT 14 05/21/2020 1313   BILITOT 0.3 05/21/2020 1313       RADIOGRAPHIC STUDIES: CT Chest W Contrast  Result Date: 05/21/2020 CLINICAL DATA:  Non-small cell lung cancer staging EXAM: CT CHEST WITH CONTRAST TECHNIQUE: Multidetector CT imaging of the chest was performed during intravenous contrast administration. CONTRAST:  59mL OMNIPAQUE IOHEXOL 300 MG/ML  SOLN COMPARISON:  October 27 2019 and PET exam from November 15, 2019 FINDINGS: Cardiovascular: Aorta is of normal caliber without significant atherosclerotic changes. Heart size is normal without pericardial effusion. Central  pulmonary vasculature without abnormality on venous phase assessment. Mediastinum/Nodes: Thoracic inlet structures with nodular thyroid, see previous imaging and biopsy results. No thoracic inlet adenopathy or axillary lymphadenopathy. Esophagus grossly normal. No hilar adenopathy. No mediastinal adenopathy. Lungs/Pleura: Nodule in the RIGHT middle lobe just above the minor fissure and abutting the major fissure has enlarged at 2.4 x 1.0 cm as compared to approximately 1.6 x 0.6 cm. There is some adjacent micro nodularity. There are areas of calcification. Areas of calcification show no change. No consolidation. No pleural effusion. No new pulmonary nodule. Upper Abdomen: Adrenal glands are normal. No acute upper abdominal process. Limited imaging of upper abdominal contents. Musculoskeletal: No acute bone finding or destructive bone process. Spinal degenerative changes. IMPRESSION: 1. Enlarging nodule in the RIGHT middle lobe just above the minor fissure and abutting the major fissure. Findings are concerning for bronchogenic neoplasm. Discussion in multi disciplinary thoracic oncologic setting is suggested with consideration for biopsy if not yet performed. 2. No new pulmonary nodule. 3. Nodular thyroid as  before. 4. Aortic atherosclerosis. Aortic Atherosclerosis (ICD10-I70.0). Electronically Signed   By: Zetta Bills M.D.   On: 05/21/2020 16:04    ASSESSMENT AND PLAN: This is a very pleasant 49 years old white female with suspicious right lower lobe pulmonary nodule concerning for neoplasm versus inflammatory process in a patient with a history of Sjogren's syndrome. She had a previous PET scan that showed no significant hypermetabolic activity in this nodule The patient had repeat CT scan of the chest performed recently.  I personally and independently reviewed the scan images and discussed the results with the patient today. Unfortunately the right middle lobe lesion continue to increase in size which  is concerning for neoplastic process of any etiology including non-small cell lung cancer versus a myeloproliferative disorder or low-grade lymphoma. I recommended for the patient to proceed with CT-guided core biopsy of the lesion.  I also referred her to cardiothoracic surgery for evaluation and surgical resection if needed based on the biopsy results. I will arrange for the patient follow-up visit after her evaluation by surgery. She was advised to call immediately if she has any concerning symptoms in the interval. The patient voices understanding of current disease status and treatment options and is in agreement with the current care plan.  All questions were answered. The patient knows to call the clinic with any problems, questions or concerns. We can certainly see the patient much sooner if necessary.  Disclaimer: This note was dictated with voice recognition software. Similar sounding words can inadvertently be transcribed and may not be corrected upon review.

## 2020-05-23 NOTE — Progress Notes (Signed)
Jill Leonard Female, 49 y.o., 1971-06-12 MRN:  [id number] Phone:  [phone] [name]) PCP:  Elby Beck, FNP Coverage:  Sherre Poot [employer] Ppo  RE: CT Biopsy Received: Today Corrie Mckusick, DO  [name] OK for CT guided right lung mass biopsy.   Enlarging. Non-FDG avid. Best for [employer] day.   Earleen Newport       Previous Messages   ----- Message -----  From: [name]  Sent: 05/23/2020 11:37 AM EDT  To: [facility] Procedure Requests  Subject: CT Biopsy                     Procedure: CT Biopsy   Reason: Solitary pulmonary nodule, CT-guided core biopsy of enlarging right lung mass   History: CT, NM PET in computer   Provider: Curt Bears   Provider Contact:  [phone]

## 2020-05-28 ENCOUNTER — Encounter: Payer: BC Managed Care – PPO | Admitting: Cardiothoracic Surgery

## 2020-05-30 NOTE — Telephone Encounter (Signed)
Spoke with pt she stated she wanted to hold off she is having a lung biopsy next will and will call back to r/s

## 2020-05-31 NOTE — Telephone Encounter (Signed)
Noted  

## 2020-06-01 ENCOUNTER — Ambulatory Visit (HOSPITAL_COMMUNITY)
Admission: RE | Admit: 2020-06-01 | Discharge: 2020-06-01 | Disposition: A | Discharge status: Home / Self Care | Payer: BC Managed Care – PPO | Source: Ambulatory Visit | Attending: Internal Medicine | Admitting: Internal Medicine

## 2020-06-01 DIAGNOSIS — Z20822 Contact with and (suspected) exposure to covid-19: Secondary | ICD-10-CM | POA: Insufficient documentation

## 2020-06-01 DIAGNOSIS — Z01812 Encounter for preprocedural laboratory examination: Secondary | ICD-10-CM | POA: Diagnosis not present

## 2020-06-01 LAB — SARS CORONAVIRUS 2 (TAT 6-24 HRS): SARS Coronavirus 2: NEGATIVE

## 2020-06-03 ENCOUNTER — Other Ambulatory Visit: Payer: Self-pay | Admitting: Student

## 2020-06-03 ENCOUNTER — Other Ambulatory Visit: Payer: Self-pay | Admitting: Radiology

## 2020-06-03 DIAGNOSIS — Z1231 Encounter for screening mammogram for malignant neoplasm of breast: Secondary | ICD-10-CM | POA: Diagnosis not present

## 2020-06-03 LAB — HM MAMMOGRAPHY

## 2020-06-04 ENCOUNTER — Ambulatory Visit (HOSPITAL_COMMUNITY): Admission: RE | Admit: 2020-06-04 | Payer: BC Managed Care – PPO | Source: Ambulatory Visit

## 2020-06-04 ENCOUNTER — Encounter (HOSPITAL_COMMUNITY): Payer: Self-pay

## 2020-06-06 ENCOUNTER — Encounter: Payer: BC Managed Care – PPO | Admitting: Cardiothoracic Surgery

## 2020-06-06 DIAGNOSIS — U071 COVID-19: Secondary | ICD-10-CM | POA: Diagnosis not present

## 2020-06-11 ENCOUNTER — Other Ambulatory Visit: Payer: Self-pay | Admitting: Family Medicine

## 2020-06-11 DIAGNOSIS — R059 Cough, unspecified: Secondary | ICD-10-CM

## 2020-06-11 MED ORDER — HYDROCODONE-HOMATROPINE 5-1.5 MG/5ML PO SYRP: 5.0000 mL | ORAL_SOLUTION | Freq: Three times a day (TID) | ORAL | 0 refills | Status: DC | PRN

## 2020-06-19 ENCOUNTER — Telehealth: Payer: Self-pay | Admitting: *Deleted

## 2020-06-19 ENCOUNTER — Other Ambulatory Visit: Payer: Self-pay | Admitting: Family Medicine

## 2020-06-19 ENCOUNTER — Encounter: Payer: Self-pay | Admitting: Family Medicine

## 2020-06-19 DIAGNOSIS — J22 Unspecified acute lower respiratory infection: Secondary | ICD-10-CM

## 2020-06-19 MED ORDER — AZITHROMYCIN 250 MG PO TABS: ORAL_TABLET | ORAL | 0 refills | Status: DC

## 2020-06-19 NOTE — Telephone Encounter (Signed)
I followed up with Dr. Julien Nordmann regarding Jill Leonard's bx. He states patient is recovering from [place] but once she feels better, she can get her bx.  I called and spoke to Ms. Marley.  She states she is still recovering.  I listened as she explained.  I gave her the phone number to central scheduling to call and re-schedule.  She states she will do this in a few days.

## 2020-06-21 ENCOUNTER — Other Ambulatory Visit: Payer: Self-pay | Admitting: Family Medicine

## 2020-06-21 DIAGNOSIS — R059 Cough, unspecified: Secondary | ICD-10-CM

## 2020-06-21 MED ORDER — ALBUTEROL SULFATE HFA 108 (90 BASE) MCG/ACT IN AERS: 1.0000 | INHALATION_SPRAY | RESPIRATORY_TRACT | 1 refills | Status: DC | PRN

## 2020-06-28 ENCOUNTER — Encounter: Payer: Self-pay | Admitting: Family Medicine

## 2020-07-01 ENCOUNTER — Other Ambulatory Visit (HOSPITAL_COMMUNITY)
Admission: RE | Admit: 2020-07-01 | Discharge: 2020-07-01 | Disposition: A | Discharge status: Home / Self Care | Payer: BC Managed Care – PPO | Source: Ambulatory Visit | Attending: Internal Medicine | Admitting: Internal Medicine

## 2020-07-01 DIAGNOSIS — Z20822 Contact with and (suspected) exposure to covid-19: Secondary | ICD-10-CM | POA: Diagnosis not present

## 2020-07-01 DIAGNOSIS — Z01812 Encounter for preprocedural laboratory examination: Secondary | ICD-10-CM | POA: Insufficient documentation

## 2020-07-01 LAB — SARS CORONAVIRUS 2 (TAT 6-24 HRS): SARS Coronavirus 2: NEGATIVE

## 2020-07-03 ENCOUNTER — Other Ambulatory Visit: Payer: Self-pay | Admitting: Radiology

## 2020-07-04 ENCOUNTER — Other Ambulatory Visit: Payer: Self-pay

## 2020-07-04 ENCOUNTER — Ambulatory Visit (HOSPITAL_COMMUNITY)
Admission: RE | Admit: 2020-07-04 | Discharge: 2020-07-04 | Disposition: A | Discharge status: Home / Self Care | Payer: BC Managed Care – PPO | Source: Ambulatory Visit | Attending: Internal Medicine | Admitting: Internal Medicine

## 2020-07-04 ENCOUNTER — Ambulatory Visit (HOSPITAL_COMMUNITY)
Admission: RE | Admit: 2020-07-04 | Discharge: 2020-07-04 | Disposition: A | Discharge status: Home / Self Care | Payer: BC Managed Care – PPO | Source: Ambulatory Visit | Attending: Interventional Radiology | Admitting: Interventional Radiology

## 2020-07-04 DIAGNOSIS — M3500 Sicca syndrome, unspecified: Secondary | ICD-10-CM | POA: Insufficient documentation

## 2020-07-04 DIAGNOSIS — Z79899 Other long term (current) drug therapy: Secondary | ICD-10-CM | POA: Insufficient documentation

## 2020-07-04 DIAGNOSIS — F329 Major depressive disorder, single episode, unspecified: Secondary | ICD-10-CM | POA: Insufficient documentation

## 2020-07-04 DIAGNOSIS — E785 Hyperlipidemia, unspecified: Secondary | ICD-10-CM | POA: Diagnosis not present

## 2020-07-04 DIAGNOSIS — K219 Gastro-esophageal reflux disease without esophagitis: Secondary | ICD-10-CM | POA: Diagnosis not present

## 2020-07-04 DIAGNOSIS — R911 Solitary pulmonary nodule: Secondary | ICD-10-CM | POA: Diagnosis not present

## 2020-07-04 DIAGNOSIS — M797 Fibromyalgia: Secondary | ICD-10-CM | POA: Insufficient documentation

## 2020-07-04 DIAGNOSIS — J95811 Postprocedural pneumothorax: Secondary | ICD-10-CM

## 2020-07-04 LAB — CBC
HCT: 45.6 % (ref 36.0–46.0)
Hemoglobin: 14.6 g/dL (ref 12.0–15.0)
MCH: 28 pg (ref 26.0–34.0)
MCHC: 32 g/dL (ref 30.0–36.0)
MCV: 87.5 fL (ref 80.0–100.0)
Platelets: 256 10*3/uL (ref 150–400)
RBC: 5.21 MIL/uL — ABNORMAL HIGH (ref 3.87–5.11)
RDW: 12.9 % (ref 11.5–15.5)
WBC: 4.1 10*3/uL (ref 4.0–10.5)
nRBC: 0 % (ref 0.0–0.2)

## 2020-07-04 LAB — PROTIME-INR
INR: 0.8 (ref 0.8–1.2)
Prothrombin Time: 11.2 seconds — ABNORMAL LOW (ref 11.4–15.2)

## 2020-07-04 LAB — APTT: aPTT: 32 seconds (ref 24–36)

## 2020-07-04 MED ORDER — ONDANSETRON HCL 4 MG/2ML IJ SOLN: 4.0000 mg | Freq: Once | INTRAMUSCULAR | Status: AC

## 2020-07-04 MED ORDER — FENTANYL CITRATE (PF) 100 MCG/2ML IJ SOLN
INTRAMUSCULAR | Status: AC | PRN
Start: 2020-07-04 — End: 2020-07-04
  Administered 2020-07-04 (×2): 50 ug via INTRAVENOUS

## 2020-07-04 MED ORDER — MIDAZOLAM HCL 2 MG/2ML IJ SOLN
INTRAMUSCULAR | Status: AC | PRN
  Administered 2020-07-04 (×2): 1 mg via INTRAVENOUS

## 2020-07-04 MED ORDER — FENTANYL CITRATE (PF) 100 MCG/2ML IJ SOLN
INTRAMUSCULAR | Status: AC
  Filled 2020-07-04: qty 2

## 2020-07-04 MED ORDER — MIDAZOLAM HCL 2 MG/2ML IJ SOLN
INTRAMUSCULAR | Status: AC
  Filled 2020-07-04: qty 2

## 2020-07-04 MED ORDER — ONDANSETRON HCL 4 MG/2ML IJ SOLN
INTRAMUSCULAR | Status: AC
  Administered 2020-07-04: 4 mg via INTRAVENOUS
  Filled 2020-07-04: qty 2

## 2020-07-04 MED ORDER — ONDANSETRON HCL 4 MG/2ML IJ SOLN
INTRAMUSCULAR | Status: AC
  Filled 2020-07-04: qty 2

## 2020-07-04 MED ORDER — SODIUM CHLORIDE 0.9 % IV SOLN: INTRAVENOUS | Status: DC

## 2020-07-04 MED ORDER — FENTANYL CITRATE (PF) 100 MCG/2ML IJ SOLN
INTRAMUSCULAR | Status: DC
Start: 2020-07-04 — End: 2020-07-05
  Filled 2020-07-04: qty 2

## 2020-07-04 NOTE — Procedures (Signed)
Interventional Radiology Procedure Note  Procedure: CT guided biopsy of right upper lobe nodule Complications: None Recommendations: - Bedrest until CXR cleared.  Minimize talking, coughing or otherwise straining.  - Follow up 1 hr CXR pending  - NPO until CXR cleared  Signed,  Corrie Mckusick, DO

## 2020-07-04 NOTE — H&P (Signed)
Chief Complaint: Patient was seen in consultation today for right lobe lung nodule  Referring Physician(s): Curt Bears  Supervising Physician: Corrie Mckusick  Patient Status: [facility] - Out-pt  History of Present Illness: Jill Leonard is a 49 y.o. female with a medical history significant for depression, dysrhythmias, fibromyalgia and Sjogren's syndrome. She presented to the ED January 2021 with palpitations and shortness of breath. Imaging showed a right lobe nodule and 4-6 week imaging follow up was recommended. Subsequent imaging showed no changes and PET scan 11/15/19 showed the nodule to be non FDG avid suggesting infectious/inflammatory scarring and a 6 month follow up was advised.   CT chest 05/21/20: Lungs/Pleura: Nodule in the RIGHT middle lobe just above the minor fissure and abutting the major fissure has enlarged at 2.4 x 1.0 cm as compared to approximately 1.6 x 0.6 cm. There is some adjacent micro nodularity. There are areas of calcification. Areas of calcification show no change. No consolidation. No pleural effusion. No new pulmonary nodule. IMPRESSION: 1. Enlarging nodule in the RIGHT middle lobe just above the minor fissure and abutting the major fissure. Findings are concerning for bronchogenic neoplasm. Discussion in multi disciplinary thoracic oncologic setting is suggested with consideration for biopsy if not yet performed. 2. No new pulmonary nodule. 3. Nodular thyroid as before. 4. Aortic atherosclerosis.  Interventional Radiology has been asked to evaluate this patient for an image-guided right lobe lung nodule biopsy for further work up and evaluation. This case has been reviewed and procedure approved by Dr. Earleen Newport.    Past Medical History:  Diagnosis Date  . Anal fissure   . Arthralgia of temporomandibular joint    MIGRAINES AND REGULAR  . Calculus of gallbladder without mention of cholecystitis or obstruction   . Complication of anesthesia     . Depression   . Dysrhythmia    RAPID HEARTBEAT AND SKIPPED BEATS ON METOPROLOL  . Family history of adverse reaction to anesthesia    FAMILY MEMBERS HAVE PONV  . Fibromyalgia   . GERD (gastroesophageal reflux disease)   . Heart murmur    IN PAST, TOLD IS GONE NOW  . History of kidney stones   . History of nephrolithiasis   . HLD (hyperlipidemia)   . PONV (postoperative nausea and vomiting)   . Sjogren - Larsson's syndrome   . Sjogren's syndrome [facility])     Past Surgical History:  Procedure Laterality Date  . ABDOMINAL HYSTERECTOMY    . CHOLECYSTECTOMY  08/20/08  . MANDIBLE SURGERY    . VENTRAL HERNIA REPAIR N/A 06/12/2019   Procedure: VENTRAL HERNIA REPAIR WITH MESH;  Surgeon: Jovita Kussmaul, MD;  Location: [place];  Service: General;  Laterality: N/A;  . WISDOM TOOTH EXTRACTION      Allergies: Hydrocodone-guaifenesin, Albuterol, Sulfonamide derivatives, Trazodone and nefazodone, and Latex  Medications: Prior to Admission medications   Medication Sig Start Date End Date Taking? Authorizing Provider  acetaminophen (TYLENOL) 500 MG tablet Take 1,000 mg by mouth every 6 (six) hours as needed for moderate pain or headache.    Yes [provider]  albuterol (VENTOLIN HFA) 108 (90 Base) MCG/ACT inhaler Inhale 1-2 puffs into the lungs every 4 (four) hours as needed for wheezing or shortness of breath (cough, shortness of breath or wheezing.). 06/21/20  Yes Elby Beck, FNP  ascorbic acid (VITAMIN C) 500 MG tablet Take 500 mg by mouth daily.   Yes [provider]  aspirin-acetaminophen-caffeine (EXCEDRIN MIGRAINE) [phone] MG tablet Take 1 tablet by  mouth every 8 (eight) hours as needed for headache or migraine.   Yes [provider]  calcium carbonate (TUMS - DOSED IN MG ELEMENTAL CALCIUM) 500 MG chewable tablet Chew 500 mg by mouth 3 (three) times daily as needed for indigestion or heartburn.    Yes [provider]  Carboxymethylcellul-Glycerin  (LUBRICATING EYE DROPS OP) Place 1 drop into both eyes daily as needed (dry eyes).   Yes [provider]  cholecalciferol (VITAMIN D) 1000 UNITS tablet Take 1,000 Units by mouth every evening.    Yes [provider]  diphenhydramine-acetaminophen (TYLENOL PM) 25-500 MG TABS tablet Take 1 tablet by mouth at bedtime as needed (sleep).    Yes [provider]  escitalopram (LEXAPRO) 20 MG tablet TAKE 1 TABLET BY MOUTH  DAILY Patient taking differently: Take 20 mg by mouth daily.  03/29/20  Yes Elby Beck, FNP  fluticasone (FLONASE) 50 MCG/ACT nasal spray Place 1 spray into both nostrils daily.    Yes [provider]  loratadine (CLARITIN) 10 MG tablet Take 10 mg by mouth daily as needed for allergies.   Yes [provider]  metoprolol succinate (TOPROL-XL) 25 MG 24 hr tablet Take 0.5 tablets (12.5 mg total) by mouth daily. 10/17/19  Yes Wellington Hampshire, MD  neomycin-bacitracin-polymyxin (NEOSPORIN) ointment Apply 1 application topically as needed for wound care.   Yes [provider]  pantoprazole (PROTONIX) 40 MG tablet TAKE 1 TABLET BY MOUTH  DAILY Patient taking differently: Take 40 mg by mouth daily.  05/21/20  Yes Bedsole, Amy E, MD  Probiotic Product (PROBIOTIC DAILY PO) Take 1 capsule by mouth every evening.    Yes [provider]  PSE HCL-DM HBR-GUAIFENESIN TAN PO Take 2 tablets by mouth 2 (two) times daily as needed (lingering covid symptoms).   Yes [provider]  vitamin B-12 (CYANOCOBALAMIN) 1000 MCG tablet Take 1,000 mcg by mouth every evening.    Yes [provider]  zinc gluconate 50 MG tablet Take 50 mg by mouth daily.   Yes [provider]  azithromycin (ZITHROMAX) 250 MG tablet Take 2 tabs PO x 1 dose, then 1 tab PO QD x 4 days Patient not taking: Reported on 07/02/2020 06/19/20   Elby Beck, FNP  mupirocin nasal ointment Drue Stager) 2 % Apply to wound 1-2 times a day 07/14/19 09/18/19   Raylene Everts, MD     Family History  Problem Relation Age of Onset  . Colon polyps Father   . Bone cancer Other        Grandmother  . Diabetes Other        Grandmother  . Heart disease Other        Grandmother    Social History   Socioeconomic History  . Marital status: Married    Spouse name: Not on file  . Number of children: Not on file  . Years of education: Not on file  . Highest education level: Not on file  Occupational History  . Not on file  Tobacco Use  . Smoking status: Never Smoker  . Smokeless tobacco: Never Used  Vaping Use  . Vaping Use: Never used  Substance and Sexual Activity  . Alcohol use: Yes    Alcohol/week: 0.0 standard drinks    Comment: Socially-rare  . Drug use: No  . Sexual activity: Not on file  Other Topics Concern  . Not on file  Social History Narrative   Married  Caffeine: 2 cups/day      No regular exercise         Social Determinants of Health   Financial Resource Strain:   . Difficulty of Paying Living Expenses: Not on file  Food Insecurity:   . Worried About Charity fundraiser in the Last Year: Not on file  . Ran Out of Food in the Last Year: Not on file  Transportation Needs:   . Lack of Transportation (Medical): Not on file  . Lack of Transportation (Non-Medical): Not on file  Physical Activity:   . Days of Exercise per Week: Not on file  . Minutes of Exercise per Session: Not on file  Stress:   . Feeling of Stress : Not on file  Social Connections:   . Frequency of Communication with Friends and Family: Not on file  . Frequency of Social Gatherings with Friends and Family: Not on file  . Attends Religious Services: Not on file  . Active Member of Clubs or Organizations: Not on file  . Attends Archivist Meetings: Not on file  . Marital Status: Not on file    Review of Systems: A 12 point ROS discussed and pertinent positives are indicated in the HPI above.  All other systems are  negative.  Review of Systems  Constitutional: Negative for appetite change and fatigue.  HENT: Positive for sinus pressure.   Respiratory: Positive for cough and shortness of breath.   Cardiovascular: Negative for chest pain and leg swelling.  Gastrointestinal: Negative for abdominal pain, diarrhea, nausea and vomiting.  Musculoskeletal: Negative for back pain.  Neurological: Positive for headaches.  Hematological: Does not bruise/bleed easily.    Vital Signs: BP 104/76   Pulse 76   Temp 98 F (36.7 C) (Oral)   Resp 17   Ht 5\' 7"  (1.702 m)   Wt 155 lb (70.3 kg)   LMP 07/03/2018   SpO2 100%   BMI 24.28 kg/m   Physical Exam Constitutional:      General: She is not in acute distress. HENT:     Mouth/Throat:     Mouth: Mucous membranes are moist.     Pharynx: Oropharynx is clear.  Cardiovascular:     Rate and Rhythm: Normal rate and regular rhythm.     Pulses: Normal pulses.     Heart sounds: Normal heart sounds.  Pulmonary:     Effort: Pulmonary effort is normal.     Comments: Faint crackles throughout left and right upper lobes. Non-productive cough.  Abdominal:     General: Bowel sounds are normal.     Palpations: Abdomen is soft.  Musculoskeletal:        General: Normal range of motion.  Skin:    General: Skin is warm and dry.  Neurological:     General: No focal deficit present.     Mental Status: She is alert and oriented to person, place, and time.  Psychiatric:        Mood and Affect: Mood normal.        Behavior: Behavior normal.        Thought Content: Thought content normal.        Judgment: Judgment normal.     Imaging: No results found.  Labs:  CBC: Recent Labs    09/26/19 0644 11/06/19 1345 05/21/20 1313 07/04/20 0928  WBC 4.6 5.4 4.5 4.1  HGB 14.2 14.4 14.4 14.6  HCT 43.1 43.6 43.7 45.6  PLT 300 300 253 256  COAGS: Recent Labs    07/04/20 0928  INR 0.8  APTT 32    BMP: Recent Labs    07/18/19 0851 09/26/19 0644  11/06/19 1345 05/21/20 1313  NA 140 141 143 140  K 3.6 3.7 3.7 3.9  CL 102 108 105 106  CO2 25 25 29 30   GLUCOSE 93 105* 111* 76  BUN 5* 8 6 8   CALCIUM 9.9 9.4 9.1 9.1  CREATININE 0.79 0.87 0.79 0.77  GFRNONAA >60 >60 >60 >60  GFRAA >60 >60 >60 >60    LIVER FUNCTION TESTS: Recent Labs    07/18/19 0851 11/06/19 1345 05/21/20 1313  BILITOT 0.5 0.3 0.3  AST 33 23 22  ALT 28 19 14   ALKPHOS 65 58 54  PROT 8.2* 8.3* 8.1  ALBUMIN 4.1 4.1 3.8    TUMOR MARKERS: No results for input(s): AFPTM, CEA, CA199, CHROMGRNA in the last 8760 hours.  Assessment and Plan:  Right lower lobe lung nodule: Jill Leonard, 49 year old female, presents today to the [facility] Interventional Radiology department for an image-guided right lung nodule biopsy.  Risks and benefits of CT guided lung nodule biopsy was discussed with the patient including, but not limited to bleeding, hemoptysis, respiratory failure requiring intubation, infection, pneumothorax requiring chest tube placement, stroke from air embolism or even death.  All of the patient's questions were answered and the patient is agreeable to proceed.  The patient has been NPO. Labs and vitals have been reviewed.   Consent signed and in chart.  Thank you for this interesting consult.  I greatly enjoyed meeting Jill Leonard and look forward to participating in their care.  A copy of this report was sent to the requesting provider on this date.  Electronically Signed: Soyla Dryer, AGACNP-BC [phone] 07/04/2020, 11:15 AM   I spent a total of  30 Minutes   in face to face in clinical consultation, greater than 50% of which was counseling/coordinating care for right lung nodule biopsy.

## 2020-07-04 NOTE — Discharge Instructions (Addendum)
Lung Biopsy, Care After This sheet gives you information about how to care for yourself after your procedure. Your health care provider may also give you more specific instructions depending on the type of biopsy you had. If you have problems or questions, contact your health care provider. What can I expect after the procedure? After the procedure, it is common to have:  A cough.  A sore throat.  Pain where a needle, bronchoscope, or incision was used to collect a biopsy sample (biopsy site). Follow these instructions at home: Medicines  Take over-the-counter and prescription medicines only as told by your health care provider.  Do not drink alcohol if your health care provider tells you not to drink.  Ask your health care provider if the medicine prescribed to you: ? Requires you to avoid driving or using heavy machinery. ? Can cause constipation. You may need to take these actions to prevent or treat constipation:  Drink enough fluid to keep your urine pale yellow.  Take over-the-counter or prescription medicines.  Eat foods that are high in fiber, such as beans, whole grains, and fresh fruits and vegetables.  Limit foods that are high in fat and processed sugars, such as fried or sweet foods.  Do not drive for 24 hours if you were given a sedative. Biopsy site care   Follow instructions from your health care provider about how to take care of your biopsy site. Make sure you: ? Wash your hands with soap and water before and after you change your bandage (dressing). If soap and water are not available, use hand sanitizer. ? Change your dressing as told by your health care provider. ? Leave stitches (sutures), skin glue, or adhesive strips in place. These skin closures may need to stay in place for 2 weeks or longer. If adhesive strip edges start to loosen and curl up, you may trim the loose edges. Do not remove adhesive strips completely unless your health care provider tells  you to do that.  Do not take baths, swim, or use a hot tub until your health care provider approves. Ask your health care provider if you may take showers. You may only be allowed to take sponge baths.  Check your biopsy site every day for signs of infection. Check for: ? Redness, swelling, or more pain. ? Fluid or blood. ? Warmth. ? Pus or a bad smell. General instructions  Return to your normal activities as told by your health care provider. Ask your health care provider what activities are safe for you.  It is up to you to get the results of your procedure. Ask your health care provider, or the department that is doing the procedure, when your results will be ready.  Keep all follow-up visits as told by your health care provider. This is important. Contact a health care provider if:  You have a fever.  You have redness, swelling, or more pain around your biopsy site.  You have fluid or blood coming from your biopsy site.  Your biopsy site feels warm to the touch.  You have pus or a bad smell coming from your biopsy site.  You have pain that does not get better with medicine. Get help right away if:  You cough up blood.  You have trouble breathing.  You have chest pain.  You lose consciousness. Summary  After the procedure, it is common to have a sore throat and a cough.  Return to your normal activities as told by your  health care provider. Ask your health care provider what activities are safe for you.  Take over-the-counter and prescription medicines only as told by your health care provider.  Report any unusual symptoms to your health care provider. This information is not intended to replace advice given to you by your health care provider. Make sure you discuss any questions you have with your health care provider. Document Revised: 10/05/2018 Document Reviewed: 09/29/2016 Elsevier Patient Education  [place]. Moderate Conscious Sedation,  Adult Sedation is the use of medicines to promote relaxation and relieve discomfort and anxiety. Moderate conscious sedation is a type of sedation. Under moderate conscious sedation, you are less alert than normal, but you are still able to respond to instructions, touch, or both. Moderate conscious sedation is used during short medical and dental procedures. It is milder than deep sedation, which is a type of sedation under which you cannot be easily woken up. It is also milder than general anesthesia, which is the use of medicines to make you unconscious. Moderate conscious sedation allows you to return to your regular activities sooner. Tell a health care provider about:  Any allergies you have.  All medicines you are taking, including vitamins, herbs, eye drops, creams, and over-the-counter medicines.  Use of steroids (by mouth or creams).  Any problems you or family members have had with sedatives and anesthetic medicines.  Any blood disorders you have.  Any surgeries you have had.  Any medical conditions you have, such as sleep apnea.  Whether you are pregnant or may be pregnant.  Any use of cigarettes, alcohol, marijuana, or street drugs. What are the risks? Generally, this is a safe procedure. However, problems may occur, including:  Getting too much medicine (oversedation).  Nausea.  Allergic reaction to medicines.  Trouble breathing. If this happens, a breathing tube may be used to help with breathing. It will be removed when you are awake and breathing on your own.  Heart trouble.  Lung trouble. What happens before the procedure? Staying hydrated Follow instructions from your health care provider about hydration, which may include:  Up to 2 hours before the procedure - you may continue to drink clear liquids, such as water, clear fruit juice, black coffee, and plain tea. Eating and drinking restrictions Follow instructions from your health care provider about  eating and drinking, which may include:  8 hours before the procedure - stop eating heavy meals or foods such as meat, fried foods, or fatty foods.  6 hours before the procedure - stop eating light meals or foods, such as toast or cereal.  6 hours before the procedure - stop drinking milk or drinks that contain milk.  2 hours before the procedure - stop drinking clear liquids. Medicine Ask your health care provider about:  Changing or stopping your regular medicines. This is especially important if you are taking diabetes medicines or blood thinners.  Taking medicines such as aspirin and ibuprofen. These medicines can thin your blood. Do not take these medicines before your procedure if your health care provider instructs you not to.  Tests and exams  You will have a physical exam.  You may have blood tests done to show: ? How well your kidneys and liver are working. ? How well your blood can clot. General instructions  Plan to have someone take you home from the hospital or clinic.  If you will be going home right after the procedure, plan to have someone with you for 24 hours.  What happens during the procedure?  An IV tube will be inserted into one of your veins.  Medicine to help you relax (sedative) will be given through the IV tube.  The medical or dental procedure will be performed. What happens after the procedure?  Your blood pressure, heart rate, breathing rate, and blood oxygen level will be monitored often until the medicines you were given have worn off.  Do not drive for 24 hours. This information is not intended to replace advice given to you by your health care provider. Make sure you discuss any questions you have with your health care provider. Document Revised: 08/13/2017 Document Reviewed: 12/21/2015 Elsevier Patient Education  2020 [employer].

## 2020-07-10 NOTE — Telephone Encounter (Signed)
Can you call Mr. Trigo mother about an earlier appointment? See if there is a 30 minute spot you can use, please note that ok per me. Thanks.

## 2020-07-11 LAB — SURGICAL PATHOLOGY

## 2020-07-11 NOTE — Telephone Encounter (Signed)
Appointment 11/8 Ms lick aware

## 2020-07-12 ENCOUNTER — Encounter: Payer: Self-pay | Admitting: Internal Medicine

## 2020-07-15 ENCOUNTER — Other Ambulatory Visit: Payer: Self-pay | Admitting: Internal Medicine

## 2020-07-15 DIAGNOSIS — R911 Solitary pulmonary nodule: Secondary | ICD-10-CM

## 2020-07-18 ENCOUNTER — Encounter: Payer: BC Managed Care – PPO | Admitting: Cardiothoracic Surgery

## 2020-07-19 ENCOUNTER — Other Ambulatory Visit: Payer: Self-pay | Admitting: Family Medicine

## 2020-07-19 DIAGNOSIS — R059 Cough, unspecified: Secondary | ICD-10-CM

## 2020-07-22 ENCOUNTER — Encounter: Payer: Self-pay | Admitting: Family Medicine

## 2020-07-30 ENCOUNTER — Other Ambulatory Visit: Payer: Self-pay

## 2020-07-30 ENCOUNTER — Ambulatory Visit (INDEPENDENT_AMBULATORY_CARE_PROVIDER_SITE_OTHER): Payer: BC Managed Care – PPO | Admitting: Emergency Medicine

## 2020-07-30 ENCOUNTER — Encounter: Payer: Self-pay | Admitting: Emergency Medicine

## 2020-07-30 DIAGNOSIS — R0602 Shortness of breath: Secondary | ICD-10-CM

## 2020-07-30 DIAGNOSIS — R911 Solitary pulmonary nodule: Secondary | ICD-10-CM | POA: Diagnosis not present

## 2020-07-30 NOTE — Assessment & Plan Note (Signed)
Intermittent dyspnea, sometimes associated with speaking, exerting.  She may occasionally hear wheezing.  I think she needs pulmonary function testing to better characterize.  Her CT scan of the chest does not show any evidence of interstitial disease associated with her autoimmune process which is good news.

## 2020-07-30 NOTE — Patient Instructions (Addendum)
We will plan to repeat your CT chest in March 2021 without contrast to follow your pulmonary nodule. We will arrange for pulmonary function testing in next office visit Follow with Dr. Lamonte Sakai next available with full pulmonary function testing on the same day.

## 2020-07-30 NOTE — Addendum Note (Signed)
Addended by: Gavin Potters R on: 07/30/2020 04:11 PM   Modules accepted: Orders

## 2020-07-30 NOTE — Assessment & Plan Note (Signed)
Very interesting case.  Her biopsy results show lymphoid and giant cell inflammatory change without a monoclonal population with some foreign body material that not otherwise specified.  I suspect that she has had episodic aspiration events and that this represents aspirated material.  There are reports of lymphoid nodular disease associated with Sjogren's, often associated with the pleura.  Not entirely clear whether the nodule needs to be followed but because it grew on serial CT scans I think that I will follow it for now.  The calcium is reassuring and clearly the biopsy results are as well.  Unclear to me that there is an indication to treat with anti-inflammatory regimen.  Next CT scan 6 months

## 2020-07-30 NOTE — Progress Notes (Signed)
Subjective:    Patient ID: Jill Leonard, female    DOB: April 21, 1971, 49 y.o.   MRN: [id number]  HPI 49 year old never smoker with a history of Sjogren's syndrome, migraines, GERD.  She had COVID19 in September, is now improved.   She has been followed by Dr. Earlie Server with oncology for a poorly formed right pulmonary nodule that looks like it was first identified on the CT-PA 09/26/2019, somewhat lobulated and abutting the major fissure 1.2 x 0.9 cm with some nearby calcification.  I have reviewed all the studies.  Still present on follow-up CT 10/27/2019 which prompted a PET scan 11/15/2019.  This was unchanged in size, did not show any hypermetabolism. No ILD present.   Most recent CT from 05/21/2020 identified some interval enlargement to 2.4 x 1.0 cm with some adjacent micro nodularity and persisting calcification. A CT biopsy was done on 07/04/2020 in IR.  Pathology revealed no evidence of malignancy, nodular lymphoid aggregates with some foreign material, foamy histiocytes and foreign body giant cells.  Immunohistochemistry was performed and was reassuring without any evidence of a lymphoproliferative disorder.  The principal sx of Sjogren's that she has are body pain, dry mouth and eyes. She occasionally has aspiration especially of dry foods, associated with coughing. She has some exertional SOB.    Review of Systems As per HPI  Past Medical History:  Diagnosis Date  . Anal fissure   . Arthralgia of temporomandibular joint    MIGRAINES AND REGULAR  . Calculus of gallbladder without mention of cholecystitis or obstruction   . Complication of anesthesia   . Depression   . Dysrhythmia    RAPID HEARTBEAT AND SKIPPED BEATS ON METOPROLOL  . Family history of adverse reaction to anesthesia    FAMILY MEMBERS HAVE PONV  . Fibromyalgia   . GERD (gastroesophageal reflux disease)   . Heart murmur    IN PAST, TOLD IS GONE NOW  . History of kidney stones   . History of nephrolithiasis   .  HLD (hyperlipidemia)   . PONV (postoperative nausea and vomiting)   . Sjogren - Larsson's syndrome   . Sjogren's syndrome ([place])      Family History  Problem Relation Age of Onset  . Colon polyps Father   . Bone cancer Other        Grandmother  . Diabetes Other        Grandmother  . Heart disease Other        Grandmother     Social History   Socioeconomic History  . Marital status: Married    Spouse name: Not on file  . Number of children: Not on file  . Years of education: Not on file  . Highest education level: Not on file  Occupational History  . Not on file  Tobacco Use  . Smoking status: Never Smoker  . Smokeless tobacco: Never Used  Vaping Use  . Vaping Use: Never used  Substance and Sexual Activity  . Alcohol use: Yes    Alcohol/week: 0.0 standard drinks    Comment: Socially-rare  . Drug use: No  . Sexual activity: Not on file  Other Topics Concern  . Not on file  Social History Narrative   Married      Caffeine: 2 cups/day      No regular exercise         Social Determinants of Health   Financial Resource Strain:   . Difficulty of Paying Living Expenses: Not on file  Food Insecurity:   . Worried About Charity fundraiser in the Last Year: Not on file  . Ran Out of Food in the Last Year: Not on file  Transportation Needs:   . Lack of Transportation (Medical): Not on file  . Lack of Transportation (Non-Medical): Not on file  Physical Activity:   . Days of Exercise per Week: Not on file  . Minutes of Exercise per Session: Not on file  Stress:   . Feeling of Stress : Not on file  Social Connections:   . Frequency of Communication with Friends and Family: Not on file  . Frequency of Social Gatherings with Friends and Family: Not on file  . Attends Religious Services: Not on file  . Active Member of Clubs or Organizations: Not on file  . Attends Archivist Meetings: Not on file  . Marital Status: Not on file  Intimate Partner Violence:    . Fear of Current or Ex-Partner: Not on file  . Emotionally Abused: Not on file  . Physically Abused: Not on file  . Sexually Abused: Not on file    From [place] May have had remote mold exposure in her 20's Has worked an office environment  Allergies  Allergen Reactions  . Hydrocodone-Guaifenesin Nausea And Vomiting  . Albuterol Other (See Comments)    Pt uses but it causes jitters after use  . Sulfonamide Derivatives Nausea And Vomiting    " I could not function"  . Trazodone And Nefazodone Other (See Comments)    Jittery and anxious and difficulty sleeping.  . Latex Rash and Other (See Comments)    Blisters     Outpatient Medications Prior to Visit  Medication Sig Dispense Refill  . acetaminophen (TYLENOL) 500 MG tablet Take 1,000 mg by mouth every 6 (six) hours as needed for moderate pain or headache.     . albuterol (VENTOLIN HFA) 108 (90 Base) MCG/ACT inhaler INHALE 1 TO 2 PUFFS INTO THE LUNGS EVERY 4 HOURS AS NEEDED FOR WHEEZING OR SHORTNESS OF BREATH OR COUGH 6.7 g 0  . ascorbic acid (VITAMIN C) 500 MG tablet Take 500 mg by mouth daily.    Marland Kitchen aspirin-acetaminophen-caffeine (EXCEDRIN MIGRAINE) 250-250-65 MG tablet Take 1 tablet by mouth every 8 (eight) hours as needed for headache or migraine.    . calcium carbonate (TUMS - DOSED IN MG ELEMENTAL CALCIUM) 500 MG chewable tablet Chew 500 mg by mouth 3 (three) times daily as needed for indigestion or heartburn.     . Carboxymethylcellul-Glycerin (LUBRICATING EYE DROPS OP) Place 1 drop into both eyes daily as needed (dry eyes).    . cholecalciferol (VITAMIN D) 1000 UNITS tablet Take 1,000 Units by mouth every evening.     . diphenhydramine-acetaminophen (TYLENOL PM) 25-500 MG TABS tablet Take 1 tablet by mouth at bedtime as needed (sleep).     Marland Kitchen escitalopram (LEXAPRO) 20 MG tablet TAKE 1 TABLET BY MOUTH  DAILY (Patient taking differently: Take 20 mg by mouth daily. ) 90 tablet 3  . fluticasone (FLONASE) 50 MCG/ACT nasal spray Place 1  spray into both nostrils daily.     Marland Kitchen loratadine (CLARITIN) 10 MG tablet Take 10 mg by mouth daily as needed for allergies.    . metoprolol succinate (TOPROL-XL) 25 MG 24 hr tablet Take 0.5 tablets (12.5 mg total) by mouth daily. 45 tablet 3  . neomycin-bacitracin-polymyxin (NEOSPORIN) ointment Apply 1 application topically as needed for wound care.    . pantoprazole (PROTONIX) 40 MG  tablet TAKE 1 TABLET BY MOUTH  DAILY (Patient taking differently: Take 40 mg by mouth daily. ) 90 tablet 0  . Probiotic Product (PROBIOTIC DAILY PO) Take 1 capsule by mouth every evening.     Marland Kitchen PSE HCL-DM HBR-GUAIFENESIN TAN PO Take 2 tablets by mouth 2 (two) times daily as needed (lingering covid symptoms).    . vitamin B-12 (CYANOCOBALAMIN) 1000 MCG tablet Take 1,000 mcg by mouth every evening.     . zinc gluconate 50 MG tablet Take 50 mg by mouth daily.    Marland Kitchen azithromycin (ZITHROMAX) 250 MG tablet Take 2 tabs PO x 1 dose, then 1 tab PO QD x 4 days 6 tablet 0   No facility-administered medications prior to visit.         Objective:   Physical Exam  Vitals:   07/30/20 1534  BP: 112/76  Pulse: 74  Temp: 97.6 F (36.4 C)  SpO2: 99%  Weight: 164 lb 3.2 oz (74.5 kg)  Height: 5\' 7"  (1.702 m)   Gen: Pleasant, well-nourished, in no distress,  normal affect  ENT: No lesions,  mouth clear,  oropharynx dry but clear, no postnasal drip  Neck: No JVD, no stridor  Lungs: No use of accessory muscles, no crackles or wheezing on normal respiration, no wheeze on forced expiration  Cardiovascular: RRR, heart sounds normal, no murmur or gallops, no peripheral edema  Musculoskeletal: No deformities, no cyanosis or clubbing  Neuro: alert, awake, non focal  Skin: Warm, no lesions or rash      Assessment & Plan:  Solitary pulmonary nodule Very interesting case.  Her biopsy results show lymphoid and giant cell inflammatory change without a monoclonal population with some foreign body material that not otherwise  specified.  I suspect that she has had episodic aspiration events and that this represents aspirated material.  There are reports of lymphoid nodular disease associated with Sjogren's, often associated with the pleura.  Not entirely clear whether the nodule needs to be followed but because it grew on serial CT scans I think that I will follow it for now.  The calcium is reassuring and clearly the biopsy results are as well.  Unclear to me that there is an indication to treat with anti-inflammatory regimen.  Next CT scan 6 months  Dyspnea Intermittent dyspnea, sometimes associated with speaking, exerting.  She may occasionally hear wheezing.  I think she needs pulmonary function testing to better characterize.  Her CT scan of the chest does not show any evidence of interstitial disease associated with her autoimmune process which is good news.  Baltazar Apo, MD, PhD 07/30/2020, 4:01 PM [facility] [phone] or if no answer [phone]

## 2020-08-01 ENCOUNTER — Other Ambulatory Visit: Payer: Self-pay | Admitting: Cardiovascular Disease

## 2020-08-01 DIAGNOSIS — R002 Palpitations: Secondary | ICD-10-CM

## 2020-08-02 NOTE — Telephone Encounter (Signed)
Refill request

## 2020-08-10 ENCOUNTER — Other Ambulatory Visit: Payer: Self-pay | Admitting: Family Medicine

## 2020-08-12 NOTE — Telephone Encounter (Signed)
Pharmacy requests refill on: Pantoprazole Sodium 40 mg   LAST REFILL: 05/21/2020 (Q-90, R-0) LAST OV: 01/19/2020 NEXT OV: Not Scheduled  PHARMACY: [facility]

## 2020-08-16 DIAGNOSIS — L853 Xerosis cutis: Secondary | ICD-10-CM | POA: Diagnosis not present

## 2020-08-16 DIAGNOSIS — L814 Other melanin hyperpigmentation: Secondary | ICD-10-CM | POA: Diagnosis not present

## 2020-08-16 DIAGNOSIS — L821 Other seborrheic keratosis: Secondary | ICD-10-CM | POA: Diagnosis not present

## 2020-08-16 DIAGNOSIS — D229 Melanocytic nevi, unspecified: Secondary | ICD-10-CM | POA: Diagnosis not present

## 2020-09-21 ENCOUNTER — Other Ambulatory Visit (HOSPITAL_COMMUNITY)
Admission: RE | Admit: 2020-09-21 | Discharge: 2020-09-21 | Disposition: A | Discharge status: Home / Self Care | Payer: BC Managed Care – PPO | Source: Ambulatory Visit | Attending: Emergency Medicine | Admitting: Emergency Medicine

## 2020-09-21 DIAGNOSIS — Z20822 Contact with and (suspected) exposure to covid-19: Secondary | ICD-10-CM | POA: Insufficient documentation

## 2020-09-21 LAB — SARS CORONAVIRUS 2 (TAT 6-24 HRS): SARS Coronavirus 2: NEGATIVE

## 2020-09-24 ENCOUNTER — Other Ambulatory Visit: Payer: Self-pay

## 2020-09-24 ENCOUNTER — Ambulatory Visit (INDEPENDENT_AMBULATORY_CARE_PROVIDER_SITE_OTHER): Payer: BC Managed Care – PPO | Admitting: Emergency Medicine

## 2020-09-24 ENCOUNTER — Encounter: Payer: Self-pay | Admitting: Emergency Medicine

## 2020-09-24 DIAGNOSIS — R911 Solitary pulmonary nodule: Secondary | ICD-10-CM

## 2020-09-24 DIAGNOSIS — R0602 Shortness of breath: Secondary | ICD-10-CM

## 2020-09-24 LAB — PULMONARY FUNCTION TEST
DL/VA % pred: 117 %
DL/VA: 4.99 ml/min/mmHg/L
DLCO cor % pred: 95 %
DLCO cor: 22.23 ml/min/mmHg
DLCO unc % pred: 95 %
DLCO unc: 22.23 ml/min/mmHg
FEF 25-75 Post: 4.41 L/sec
FEF 25-75 Pre: 3.73 L/sec
FEF2575-%Change-Post: 18 %
FEF2575-%Pred-Post: 148 %
FEF2575-%Pred-Pre: 125 %
FEV1-%Change-Post: 4 %
FEV1-%Pred-Post: 98 %
FEV1-%Pred-Pre: 94 %
FEV1-Post: 3.07 L
FEV1-Pre: 2.94 L
FEV1FVC-%Change-Post: 5 %
FEV1FVC-%Pred-Pre: 106 %
FEV6-%Change-Post: 0 %
FEV6-%Pred-Post: 88 %
FEV6-%Pred-Pre: 89 %
FEV6-Post: 3.39 L
FEV6-Pre: 3.42 L
FEV6FVC-%Pred-Post: 102 %
FEV6FVC-%Pred-Pre: 102 %
FVC-%Change-Post: -1 %
FVC-%Pred-Post: 86 %
FVC-%Pred-Pre: 87 %
FVC-Post: 3.39 L
FVC-Pre: 3.42 L
Post FEV1/FVC ratio: 91 %
Post FEV6/FVC ratio: 100 %
Pre FEV1/FVC ratio: 86 %
Pre FEV6/FVC Ratio: 100 %
RV % pred: 97 %
RV: 1.86 L
TLC % pred: 95 %
TLC: 5.23 L

## 2020-09-24 NOTE — Assessment & Plan Note (Signed)
Most consistent with inflammatory response to aspiration.  She does have intermittent aspiration symptoms due to her Sjogren's.  We will repeat her CT in March.  If stable then we can decide whether it needs to be followed further.  There are areas of calcification and the biopsy was benign.

## 2020-09-24 NOTE — Addendum Note (Signed)
Addended by: Gavin Potters R on: 09/24/2020 04:49 PM   Modules accepted: Orders

## 2020-09-24 NOTE — Patient Instructions (Signed)
Your pulmonary function testing today is normal.  This is good news. Continue your pantoprazole as you have been taking it. Continue your fluticasone nasal spray and your nasal saline as you have been taking them Agree with continuing to work on your exercise, cardiopulmonary conditioning We will repeat your CT scan of the chest without contrast in March 2022 Follow Dr. Lamonte Sakai in March after your CT so that we can review the results together.

## 2020-09-24 NOTE — Progress Notes (Signed)
Full PFT performed today. °

## 2020-09-24 NOTE — Progress Notes (Signed)
Subjective:    Patient ID: Jill Leonard, female    DOB: 1970-11-22, 50 y.o.   MRN: [id number]  HPI 50 year old never smoker with a history of Sjogren's syndrome, migraines, GERD.  She had COVID19 in September, is now improved.   She has been followed by Dr. Earlie Server with oncology for a poorly formed right pulmonary nodule that looks like it was first identified on the CT-PA 09/26/2019, somewhat lobulated and abutting the major fissure 1.2 x 0.9 cm with some nearby calcification.  I have reviewed all the studies.  Still present on follow-up CT 10/27/2019 which prompted a PET scan 11/15/2019.  This was unchanged in size, did not show any hypermetabolism. No ILD present.   Most recent CT from 05/21/2020 identified some interval enlargement to 2.4 x 1.0 cm with some adjacent micro nodularity and persisting calcification. A CT biopsy was done on 07/04/2020 in IR.  Pathology revealed no evidence of malignancy, nodular lymphoid aggregates with some foreign material, foamy histiocytes and foreign body giant cells.  Immunohistochemistry was performed and was reassuring without any evidence of a lymphoproliferative disorder.  The principal sx of Sjogren's that she has are body pain, dry mouth and eyes. She occasionally has aspiration especially of dry foods, associated with coughing. She has some exertional SOB.   ROV 09/24/20 --follow-up visit for 50 year old woman with a history of Sjogren's, GERD, migraines, a poorly formed right pulmonary nodule that was biopsied and found to have lymphoid nodularity, histiocytes and foreign body giant cells.  No evidence for a lymphoproliferative disorder, suspect related to an aspiration event versus related to her Sjogren's. She underwent pulmonary function testing today to evaluate intermittent shortness of breath that can sometimes happen with exertion, speaking. She has a cough at night, can wake her. She has some breakthrough reflux even on PPI. She has some drainage  on flonase. Avoids loratadine due to her dryness. She can get winded with exerting, when bending over, more in the evening.   Pulmonary function testing performed today reviewed by me, show normal airflows without a bronchodilator response, normal lung volumes, normal diffusion capacity.  Her flow volume loop was normal in appearance.  Planning for repeat CT chest in March to follow her pulmonary nodule   Review of Systems As per HPI        Objective:   Physical Exam  Vitals:   09/24/20 1456  BP: 110/72  Pulse: 85  Temp: (!) 97.1 F (36.2 C)  SpO2: 98%  Weight: 165 lb (74.8 kg)  Height: 5\' 6"  (1.676 m)   Gen: Pleasant, well-nourished, in no distress,  normal affect  ENT: No lesions,  mouth clear,  oropharynx dry but clear, no postnasal drip  Neck: No JVD, no stridor  Lungs: No use of accessory muscles, no crackles or wheezing on normal respiration, no wheeze on forced expiration  Cardiovascular: RRR, heart sounds normal, no murmur or gallops, no peripheral edema  Musculoskeletal: No deformities, no cyanosis or clubbing  Neuro: alert, awake, non focal  Skin: Warm, no lesions or rash      Assessment & Plan:  Solitary pulmonary nodule Most consistent with inflammatory response to aspiration.  She does have intermittent aspiration symptoms due to her Sjogren's.  We will repeat her CT in March.  If stable then we can decide whether it needs to be followed further.  There are areas of calcification and the biopsy was benign.  Dyspnea Reassuring PFT.  I suspect that there is a component of upper  airway irritation in the setting of severe dryness from her Sjogren's, GERD, rhinitis.  Possibly also component of deconditioning.  She is starting to work on this with daily exercise.  Continue to try to treat her other contributors.  Baltazar Apo, MD, PhD 09/24/2020, 3:19 PM [facility] [phone] or if no answer [phone]

## 2020-09-24 NOTE — Assessment & Plan Note (Signed)
Reassuring PFT.  I suspect that there is a component of upper airway irritation in the setting of severe dryness from her Sjogren's, GERD, rhinitis.  Possibly also component of deconditioning.  She is starting to work on this with daily exercise.  Continue to try to treat her other contributors.

## 2020-10-24 ENCOUNTER — Other Ambulatory Visit: Payer: Self-pay | Admitting: Surgery

## 2020-10-24 DIAGNOSIS — E041 Nontoxic single thyroid nodule: Secondary | ICD-10-CM

## 2020-11-04 DIAGNOSIS — E042 Nontoxic multinodular goiter: Secondary | ICD-10-CM | POA: Diagnosis not present

## 2020-11-05 ENCOUNTER — Ambulatory Visit
Admission: RE | Admit: 2020-11-05 | Discharge: 2020-11-05 | Disposition: A | Discharge status: Home / Self Care | Payer: BC Managed Care – PPO | Source: Ambulatory Visit | Attending: Surgery | Admitting: Surgery

## 2020-11-05 DIAGNOSIS — E041 Nontoxic single thyroid nodule: Secondary | ICD-10-CM | POA: Diagnosis not present

## 2020-11-18 DIAGNOSIS — L820 Inflamed seborrheic keratosis: Secondary | ICD-10-CM | POA: Diagnosis not present

## 2020-11-19 ENCOUNTER — Ambulatory Visit: Payer: BC Managed Care – PPO | Admitting: Cardiovascular Disease

## 2020-11-28 ENCOUNTER — Other Ambulatory Visit: Payer: Self-pay

## 2020-11-28 ENCOUNTER — Ambulatory Visit (INDEPENDENT_AMBULATORY_CARE_PROVIDER_SITE_OTHER)
Admission: RE | Admit: 2020-11-28 | Discharge: 2020-11-28 | Disposition: A | Discharge status: Home / Self Care | Payer: BC Managed Care – PPO | Source: Ambulatory Visit | Attending: Emergency Medicine | Admitting: Emergency Medicine

## 2020-11-28 DIAGNOSIS — Z9049 Acquired absence of other specified parts of digestive tract: Secondary | ICD-10-CM | POA: Diagnosis not present

## 2020-11-28 DIAGNOSIS — R911 Solitary pulmonary nodule: Secondary | ICD-10-CM | POA: Diagnosis not present

## 2020-12-17 ENCOUNTER — Encounter: Payer: Self-pay | Admitting: Cardiovascular Disease

## 2020-12-17 ENCOUNTER — Other Ambulatory Visit: Payer: Self-pay

## 2020-12-17 ENCOUNTER — Ambulatory Visit (INDEPENDENT_AMBULATORY_CARE_PROVIDER_SITE_OTHER): Payer: BC Managed Care – PPO | Admitting: Cardiovascular Disease

## 2020-12-17 VITALS — BP 110/80 | HR 74 | Ht 66.0 in | Wt 151.2 lb

## 2020-12-17 DIAGNOSIS — I059 Rheumatic mitral valve disease, unspecified: Secondary | ICD-10-CM | POA: Diagnosis not present

## 2020-12-17 DIAGNOSIS — R002 Palpitations: Secondary | ICD-10-CM | POA: Diagnosis not present

## 2020-12-17 NOTE — Patient Instructions (Signed)
Medication Instructions:  No changes  *If you need a refill on your cardiac medications before your next appointment, please call your pharmacy*   Lab Work: None ordered   If you have labs (blood work) drawn today and your tests are completely normal, you will receive your results only by: Marland Kitchen MyChart Message (if you have MyChart) OR . A paper copy in the mail If you have any lab test that is abnormal or we need to change your treatment, we will call you to review the results.   Testing/Procedures: Your physician has requested that you have an echocardiogram. Echocardiography is a painless test that uses sound waves to create images of your heart. It provides your doctor with information about the size and shape of your heart and how well your heart's chambers and valves are working. This procedure takes approximately one hour. There are no restrictions for this procedure.    Follow-Up: At [facility], you and your health needs are our priority.  As part of our continuing mission to provide you with exceptional heart care, we have created designated Provider Care Teams.  These Care Teams include your primary Cardiologist (physician) and Advanced Practice Providers (APPs -  Physician Assistants and Nurse Practitioners) who all work together to provide you with the care you need, when you need it.  We recommend signing up for the patient portal called "MyChart".  Sign up information is provided on this After Visit Summary.  MyChart is used to connect with patients for Virtual Visits (Telemedicine).  Patients are able to view lab/test results, encounter notes, upcoming appointments, etc.  Non-urgent messages can be sent to your provider as well.   To learn more about what you can do with MyChart, go to [email or web address].    Your next appointment:   12 month(s)  The format for your next appointment:   In Person  Provider:   Kathlyn Sacramento, MD

## 2020-12-17 NOTE — Progress Notes (Signed)
Cardiology Office Note   Date:  12/17/2020   ID:  Jill Leonard, DOB Aug 22, 1971, MRN [id number]  PCP:  Jinny Sanders, MD  Cardiologist:   Kathlyn Sacramento, MD   No chief complaint on file.     History of Present Illness: Jill Leonard is a 50 y.o. female who presents for a follow-up visit regarding palpitations and tachycardia. She has known history of Sjogren's syndrome.  Echocardiogram in January 2016 showed normal LV systolic function with an ejection fraction of 60% with borderline prolapse of anterior mitral valve leaflet with only mild mitral regurgitation and no evidence of pulmonary hypertension. A Holter monitor showed normal sinus rhythm with occasional PVCs and PACs. She had a total of 445 PVCs and 1200 PACs. A treadmill stress test in June of 2016 was normal. she was started on small dose Toprol with improvement in palpitations. She also cut down on caffeine intake.  She had a repeat echocardiogram in October 2019 which showed normal LV systolic function with mild mitral valve prolapse and trivial regurgitation.  She has long and thyroid biopsies done in the last year and both were benign.  She still stressed about her mother's illness who had a stroke and congestive heart failure.  She has been doing reasonably well with stable palpitations.  No chest pain and she has mild exertional dyspnea.  Past Medical History:  Diagnosis Date  . Anal fissure   . Arthralgia of temporomandibular joint    MIGRAINES AND REGULAR  . Calculus of gallbladder without mention of cholecystitis or obstruction   . Complication of anesthesia   . Depression   . Dysrhythmia    RAPID HEARTBEAT AND SKIPPED BEATS ON METOPROLOL  . Family history of adverse reaction to anesthesia    FAMILY MEMBERS HAVE PONV  . Fibromyalgia   . GERD (gastroesophageal reflux disease)   . Heart murmur    IN PAST, TOLD IS GONE NOW  . History of kidney stones   . History of nephrolithiasis   . HLD  (hyperlipidemia)   . PONV (postoperative nausea and vomiting)   . Sjogren - Larsson's syndrome   . Sjogren's syndrome [facility])     Past Surgical History:  Procedure Laterality Date  . ABDOMINAL HYSTERECTOMY    . CHOLECYSTECTOMY  08/20/08  . MANDIBLE SURGERY    . VENTRAL HERNIA REPAIR N/A 06/12/2019   Procedure: VENTRAL HERNIA REPAIR WITH MESH;  Surgeon: Jovita Kussmaul, MD;  Location: [facility];  Service: General;  Laterality: N/A;  . WISDOM TOOTH EXTRACTION       Current Outpatient Medications  Medication Sig Dispense Refill  . acetaminophen (TYLENOL) 500 MG tablet Take 1,000 mg by mouth every 6 (six) hours as needed for moderate pain or headache.     . albuterol (VENTOLIN HFA) 108 (90 Base) MCG/ACT inhaler INHALE 1 TO 2 PUFFS INTO THE LUNGS EVERY 4 HOURS AS NEEDED FOR WHEEZING OR SHORTNESS OF BREATH OR COUGH 6.7 g 0  . ascorbic acid (VITAMIN C) 500 MG tablet Take 500 mg by mouth daily.    Marland Kitchen aspirin-acetaminophen-caffeine (EXCEDRIN MIGRAINE) 250-250-65 MG tablet Take 1 tablet by mouth every 8 (eight) hours as needed for headache or migraine.    . calcium carbonate (TUMS - DOSED IN MG ELEMENTAL CALCIUM) 500 MG chewable tablet Chew 500 mg by mouth 3 (three) times daily as needed for indigestion or heartburn.     . Carboxymethylcellul-Glycerin (LUBRICATING EYE DROPS OP) Place 1 drop into both eyes daily  as needed (dry eyes).    . cholecalciferol (VITAMIN D) 1000 UNITS tablet Take 1,000 Units by mouth every evening.    . diphenhydramine-acetaminophen (TYLENOL PM) 25-500 MG TABS tablet Take 1 tablet by mouth at bedtime as needed (sleep).     Marland Kitchen escitalopram (LEXAPRO) 20 MG tablet TAKE 1 TABLET BY MOUTH  DAILY (Patient taking differently: Take 20 mg by mouth daily.) 90 tablet 3  . fluticasone (FLONASE) 50 MCG/ACT nasal spray Place 1 spray into both nostrils daily.    . metoprolol succinate (TOPROL-XL) 25 MG 24 hr tablet TAKE ONE-HALF TABLET BY  MOUTH DAILY 45 tablet 3  . neomycin-bacitracin-polymyxin  (NEOSPORIN) ointment Apply 1 application topically as needed for wound care.    . pantoprazole (PROTONIX) 40 MG tablet TAKE 1 TABLET BY MOUTH  DAILY 90 tablet 1  . Probiotic Product (PROBIOTIC DAILY PO) Take 1 capsule by mouth every evening.     . vitamin B-12 (CYANOCOBALAMIN) 1000 MCG tablet Take 1,000 mcg by mouth every evening.     . zinc gluconate 50 MG tablet Take 50 mg by mouth daily.     No current facility-administered medications for this visit.    Allergies:   Hydrocodone-guaifenesin, Albuterol, Sulfonamide derivatives, Trazodone and nefazodone, and Latex    Social History:  The patient  reports that she has never smoked. She has never used smokeless tobacco. She reports current alcohol use. She reports that she does not use drugs.   Family History:  The patient's family history includes Bone cancer in an other family member; Colon polyps in her father; Diabetes in an other family member; Heart disease in an other family member.    ROS:  Please see the history of present illness.   Otherwise, review of systems are positive for none.   All other systems are reviewed and negative.    PHYSICAL EXAM: VS:  BP 110/80   Pulse 74   Ht 5\' 6"  (1.676 m)   Wt 151 lb 3.2 oz (68.6 kg)   LMP 07/03/2018   BMI 24.40 kg/m  , BMI Body mass index is 24.4 kg/m. GEN: Well nourished, well developed, in no acute distress  HEENT: normal  Neck: no JVD, carotid bruits, or masses Cardiac: RRR; no murmurs, rubs, or gallops,no edema  Respiratory:  clear to auscultation bilaterally, normal work of breathing GI: soft, nontender, nondistended, + BS MS: no deformity or atrophy  Skin: warm and dry, no rash Neuro:  Strength and sensation are intact Psych: euthymic mood, full affect   EKG:  EKG is ordered today. EKG showed normal sinus rhythm with nonspecific T wave changes.   Recent Labs: 05/21/2020: ALT 14; BUN 8; Creatinine 0.77; Potassium 3.9; Sodium 140 07/04/2020: Hemoglobin 14.6; Platelets  256    Lipid Panel    Component Value Date/Time   CHOL 200 06/10/2018 0837   TRIG 152.0 (H) 06/10/2018 0837   HDL 48.20 06/10/2018 0837   CHOLHDL 4 06/10/2018 0837   VLDL 30.4 06/10/2018 0837   LDLCALC 121 (H) 06/10/2018 0837      Wt Readings from Last 3 Encounters:  12/17/20 151 lb 3.2 oz (68.6 kg)  09/24/20 165 lb (74.8 kg)  07/30/20 164 lb 3.2 oz (74.5 kg)       No flowsheet data found.    ASSESSMENT AND PLAN:  1.  Palpitations due to PVCs and PACs: Overall well controlled with small dose of Toprol.    2.  Borderline mitral valve prolapse with mild mitral regurgitation.  I requested a follow-up echocardiogram given that most recent echo was in 2019.  3.  Panic attacks and anxiety.  Seems to be improved compared to last year.  4.  Bilateral foot pain and numbness: Likely neuropathy related to Sjogren's syndrome.  Her distal pulses are normal.   Disposition:   FU with me in 1 year  Signed,  Kathlyn Sacramento, MD  12/17/2020 9:28 AM    [facility]

## 2020-12-30 DIAGNOSIS — H52203 Unspecified astigmatism, bilateral: Secondary | ICD-10-CM | POA: Diagnosis not present

## 2020-12-30 DIAGNOSIS — H524 Presbyopia: Secondary | ICD-10-CM | POA: Diagnosis not present

## 2020-12-30 DIAGNOSIS — H5213 Myopia, bilateral: Secondary | ICD-10-CM | POA: Diagnosis not present

## 2020-12-30 DIAGNOSIS — H04123 Dry eye syndrome of bilateral lacrimal glands: Secondary | ICD-10-CM | POA: Diagnosis not present

## 2021-01-23 ENCOUNTER — Other Ambulatory Visit: Payer: Self-pay

## 2021-01-23 ENCOUNTER — Ambulatory Visit (HOSPITAL_COMMUNITY): Payer: BC Managed Care – PPO | Attending: Cardiology

## 2021-01-23 DIAGNOSIS — I059 Rheumatic mitral valve disease, unspecified: Secondary | ICD-10-CM | POA: Diagnosis not present

## 2021-01-23 LAB — ECHOCARDIOGRAM COMPLETE
Area-P 1/2: 3.53 cm2
S' Lateral: 2.8 cm

## 2021-02-07 ENCOUNTER — Other Ambulatory Visit: Payer: Self-pay | Admitting: *Deleted

## 2021-02-07 DIAGNOSIS — F32A Depression, unspecified: Secondary | ICD-10-CM

## 2021-02-07 NOTE — Telephone Encounter (Signed)
Last office visit 01/19/2020 with Jill Leonard for injury to right thumbnail.  Last refilled Lexapro 03/29/2020 for #90 with 3 refills.  Pantoprazole 08/12/2020 for #90 with 1 refill.  No TOC appointments.  Not sure why Dr. Diona Browner is listed as PCP because we have never seen her.

## 2021-02-08 MED ORDER — ESCITALOPRAM OXALATE 20 MG PO TABS: 1.0000 | ORAL_TABLET | Freq: Every day | ORAL | 1 refills | Status: DC

## 2021-02-08 MED ORDER — PANTOPRAZOLE SODIUM 40 MG PO TBEC: 1.0000 | DELAYED_RELEASE_TABLET | Freq: Every day | ORAL | 1 refills | Status: DC

## 2021-02-20 ENCOUNTER — Encounter: Payer: Self-pay | Admitting: Primary Care

## 2021-02-20 ENCOUNTER — Ambulatory Visit (INDEPENDENT_AMBULATORY_CARE_PROVIDER_SITE_OTHER): Payer: BC Managed Care – PPO | Admitting: Primary Care

## 2021-02-20 ENCOUNTER — Other Ambulatory Visit: Payer: Self-pay

## 2021-02-20 DIAGNOSIS — K1370 Unspecified lesions of oral mucosa: Secondary | ICD-10-CM

## 2021-02-20 LAB — CBC WITH DIFFERENTIAL/PLATELET
Basophils Absolute: 0 10*3/uL (ref 0.0–0.1)
Basophils Relative: 1.1 % (ref 0.0–3.0)
Eosinophils Absolute: 0.1 10*3/uL (ref 0.0–0.7)
Eosinophils Relative: 2.2 % (ref 0.0–5.0)
HCT: 41.8 % (ref 36.0–46.0)
Hemoglobin: 14.1 g/dL (ref 12.0–15.0)
Lymphocytes Relative: 30.5 % (ref 12.0–46.0)
Lymphs Abs: 1.3 10*3/uL (ref 0.7–4.0)
MCHC: 33.7 g/dL (ref 30.0–36.0)
MCV: 83.9 fl (ref 78.0–100.0)
Monocytes Absolute: 0.4 10*3/uL (ref 0.1–1.0)
Monocytes Relative: 8.4 % (ref 3.0–12.0)
Neutro Abs: 2.5 10*3/uL (ref 1.4–7.7)
Neutrophils Relative %: 57.8 % (ref 43.0–77.0)
Platelets: 271 10*3/uL (ref 150.0–400.0)
RBC: 4.98 Mil/uL (ref 3.87–5.11)
RDW: 14.1 % (ref 11.5–15.5)
WBC: 4.3 10*3/uL (ref 4.0–10.5)

## 2021-02-20 MED ORDER — CLINDAMYCIN HCL 150 MG PO CAPS: 150.0000 mg | ORAL_CAPSULE | Freq: Three times a day (TID) | ORAL | 0 refills | Status: DC

## 2021-02-20 NOTE — Assessment & Plan Note (Addendum)
History of this previously, treated with clindamycin with resolve.  Today her lesions represent more HSV. She denies a history however I do not see where she has ever been tested. She agrees today.  Will proceed with clindamycin treatment given her history and request. She is adamant that this will help. We did discuss that Valtrex may be more beneficial if this is HSV. Will check CBC.  Rx for clindamycin 150 mg sent to pharmacy. She will update. Await labs.

## 2021-02-20 NOTE — Patient Instructions (Signed)
Stop by the lab prior to leaving today. I will notify you of your results once received.   Start clindamycin 150 mg. Take 1 capsule by mouth three times daily. I'll be in touch regarding your lab results.  It was a pleasure meeting you!

## 2021-02-20 NOTE — Progress Notes (Signed)
Subjective:    Patient ID: Jill Leonard, female    DOB: 1970/09/19, 50 y.o.   MRN: [id number]  HPI  Jill Leonard is a very pleasant 50 y.o. female patient of Dr. Diona Browner with a history of GERD, hearing loss, dyspnea, GAD, sjogren's disease, tinnitus who presents today to discuss oral sore.   Her sore is noted to the corner to the right lower lip which began about four days ago. She describes this as sore and tingling, noticed some white blisters with oozing a few days ago. She originally began to experience these sores in November 2020 with another flare in January 2021. One flare was to her nose which was thought to be impetigo. She was also diagnosed with "cellulitis", has been treated with several topical and oral antibiotics in the past without resolve. She was treated with Clindamycin 150 mg in January 2021 with resolve.   She denies throat closure or shortness of breath, fevers. She had Covid-19 two weeks ago, she has not been vaccinated.   She denies a history of HSV, husband does have cold sore outbreaks. She doesn't believe this is a cold sore. She would like to try Clindamycin again as this was helpful in the past. She would like to get ahead of this before it gets too bad.    BP Readings from Last 3 Encounters:  02/20/21 102/62  12/17/20 110/80  09/24/20 110/72      Review of Systems  Constitutional:  Negative for fever.  Skin:        Oral sore        Past Medical History:  Diagnosis Date   Anal fissure    Arthralgia of temporomandibular joint    MIGRAINES AND REGULAR   Calculus of gallbladder without mention of cholecystitis or obstruction    Complication of anesthesia    Depression    Dysrhythmia    RAPID HEARTBEAT AND SKIPPED BEATS ON METOPROLOL   Family history of adverse reaction to anesthesia    FAMILY MEMBERS HAVE PONV   Fibromyalgia    GERD (gastroesophageal reflux disease)    Heart murmur    IN PAST, TOLD IS GONE NOW   History of kidney  stones    History of nephrolithiasis    HLD (hyperlipidemia)    PONV (postoperative nausea and vomiting)    Sjogren - Larsson's syndrome    Sjogren's syndrome (HCC)     Social History   Socioeconomic History   Marital status: Married    Spouse name: Not on file   Number of children: Not on file   Years of education: Not on file   Highest education level: Not on file  Occupational History   Not on file  Tobacco Use   Smoking status: Never   Smokeless tobacco: Never  Vaping Use   Vaping Use: Never used  Substance and Sexual Activity   Alcohol use: Yes    Alcohol/week: 0.0 standard drinks    Comment: Socially-rare   Drug use: No   Sexual activity: Not on file  Other Topics Concern   Not on file  Social History Narrative   Married      Caffeine: 2 cups/day      No regular exercise         Social Determinants of Health   Financial Resource Strain: Not on file  Food Insecurity: Not on file  Transportation Needs: Not on file  Physical Activity: Not on file  Stress: Not on file  Social Connections: Not on file  Intimate Partner Violence: Not on file    Past Surgical History:  Procedure Laterality Date   ABDOMINAL HYSTERECTOMY     CHOLECYSTECTOMY  08/20/08   MANDIBLE SURGERY     VENTRAL HERNIA REPAIR N/A 06/12/2019   Procedure: VENTRAL HERNIA REPAIR WITH MESH;  Surgeon: Autumn Messing III, MD;  Location: [facility] OR;  Service: General;  Laterality: N/A;   WISDOM TOOTH EXTRACTION      Family History  Problem Relation Age of Onset   Colon polyps Father    Bone cancer Other        Grandmother   Diabetes Other        Grandmother   Heart disease Other        Grandmother    Allergies  Allergen Reactions   Hydrocodone-Guaifenesin Nausea And Vomiting   Albuterol Other (See Comments)    Pt uses but it causes jitters after use   Sulfonamide Derivatives Nausea And Vomiting    " I could not function"   Trazodone And Nefazodone Other (See Comments)    Jittery and anxious  and difficulty sleeping.   Latex Rash and Other (See Comments)    Blisters    Current Outpatient Medications on File Prior to Visit  Medication Sig Dispense Refill   acetaminophen (TYLENOL) 500 MG tablet Take 1,000 mg by mouth every 6 (six) hours as needed for moderate pain or headache.      albuterol (VENTOLIN HFA) 108 (90 Base) MCG/ACT inhaler INHALE 1 TO 2 PUFFS INTO THE LUNGS EVERY 4 HOURS AS NEEDED FOR WHEEZING OR SHORTNESS OF BREATH OR COUGH 6.7 g 0   ascorbic acid (VITAMIN C) 500 MG tablet Take 500 mg by mouth daily.     aspirin-acetaminophen-caffeine (EXCEDRIN MIGRAINE) 250-250-65 MG tablet Take 1 tablet by mouth every 8 (eight) hours as needed for headache or migraine.     calcium carbonate (TUMS - DOSED IN MG ELEMENTAL CALCIUM) 500 MG chewable tablet Chew 500 mg by mouth 3 (three) times daily as needed for indigestion or heartburn.      Carboxymethylcellul-Glycerin (LUBRICATING EYE DROPS OP) Place 1 drop into both eyes daily as needed (dry eyes).     cholecalciferol (VITAMIN D) 1000 UNITS tablet Take 1,000 Units by mouth every evening.     diphenhydramine-acetaminophen (TYLENOL PM) 25-500 MG TABS tablet Take 1 tablet by mouth at bedtime as needed (sleep).      escitalopram (LEXAPRO) 20 MG tablet Take 1 tablet (20 mg total) by mouth daily. 90 tablet 1   fluticasone (FLONASE) 50 MCG/ACT nasal spray Place 1 spray into both nostrils daily.     metoprolol succinate (TOPROL-XL) 25 MG 24 hr tablet TAKE ONE-HALF TABLET BY  MOUTH DAILY 45 tablet 3   neomycin-bacitracin-polymyxin (NEOSPORIN) ointment Apply 1 application topically as needed for wound care.     pantoprazole (PROTONIX) 40 MG tablet Take 1 tablet (40 mg total) by mouth daily. 90 tablet 1   Probiotic Product (PROBIOTIC DAILY PO) Take 1 capsule by mouth every evening.      vitamin B-12 (CYANOCOBALAMIN) 1000 MCG tablet Take 1,000 mcg by mouth every evening.      zinc gluconate 50 MG tablet Take 50 mg by mouth daily.     No current  facility-administered medications on file prior to visit.    BP 102/62   Pulse 97   Temp 98.6 F (37 C) (Temporal)   Ht 5\' 6"  (1.676 m)   Wt 158 lb (71.7  kg)   LMP 07/03/2018   SpO2 97%   BMI 25.50 kg/m  Objective:   Physical Exam Constitutional:      Appearance: She is not ill-appearing.  HENT:     Nose: Nose normal.     Mouth/Throat:     Comments: Two small blister like lesions noted to right lower lip at corner.  No drainage.  Pulmonary:     Effort: Pulmonary effort is normal.  Neurological:     Mental Status: She is alert.          Assessment & Plan:      This visit occurred during the SARS-CoV-2 public health emergency.  Safety protocols were in place, including screening questions prior to the visit, additional usage of staff PPE, and extensive cleaning of exam room while observing appropriate contact time as indicated for disinfecting solutions.

## 2021-02-25 DIAGNOSIS — Z6824 Body mass index (BMI) 24.0-24.9, adult: Secondary | ICD-10-CM | POA: Diagnosis not present

## 2021-02-25 DIAGNOSIS — Z01419 Encounter for gynecological examination (general) (routine) without abnormal findings: Secondary | ICD-10-CM | POA: Diagnosis not present

## 2021-02-25 DIAGNOSIS — Z1211 Encounter for screening for malignant neoplasm of colon: Secondary | ICD-10-CM | POA: Diagnosis not present

## 2021-02-25 DIAGNOSIS — Z1231 Encounter for screening mammogram for malignant neoplasm of breast: Secondary | ICD-10-CM | POA: Diagnosis not present

## 2021-02-27 LAB — HSV 1/2 AB (IGM), IFA W/RFLX TITER
HSV 1 IgM Screen: NEGATIVE
HSV 2 IgM Screen: NEGATIVE

## 2021-02-27 LAB — HSV(HERPES SIMPLEX VRS) I + II AB-IGG
HAV 1 IGG,TYPE SPECIFIC AB: 19.9 index — ABNORMAL HIGH
HSV 2 IGG,TYPE SPECIFIC AB: <0.9 index

## 2021-06-25 ENCOUNTER — Other Ambulatory Visit: Payer: Self-pay | Admitting: Family

## 2021-07-02 DIAGNOSIS — Z1231 Encounter for screening mammogram for malignant neoplasm of breast: Secondary | ICD-10-CM | POA: Diagnosis not present

## 2021-07-02 LAB — HM MAMMOGRAPHY

## 2021-07-04 ENCOUNTER — Encounter: Payer: Self-pay | Admitting: Family Medicine

## 2021-07-07 ENCOUNTER — Encounter: Payer: Self-pay | Admitting: *Deleted

## 2021-07-07 ENCOUNTER — Other Ambulatory Visit: Payer: Self-pay | Admitting: Cardiovascular Disease

## 2021-07-07 DIAGNOSIS — R002 Palpitations: Secondary | ICD-10-CM

## 2021-07-13 IMAGING — CT CT CHEST W/ CM
1 series · 15 of 34 positions shown, 19 images · IV contrast (APPLIED)
Comparison: 09/26/2019 CT

CLINICAL DATA: 48-year-old female for follow-up of RIGHT lung
opacity/nodule.

EXAM:
CT CHEST WITH CONTRAST
TECHNIQUE: Multidetector CT imaging of the chest was performed during
intravenous contrast administration.
CONTRAST:  75mL [id number] IOPAMIDOL ([id number]) INJECTION 61%

[Series 2: chest w/cm · axial · 0.61mm/px · z∈[-326,-30]mm · 15 of 174 slices shown, 19 images]
[im 13/174  mediastinal]
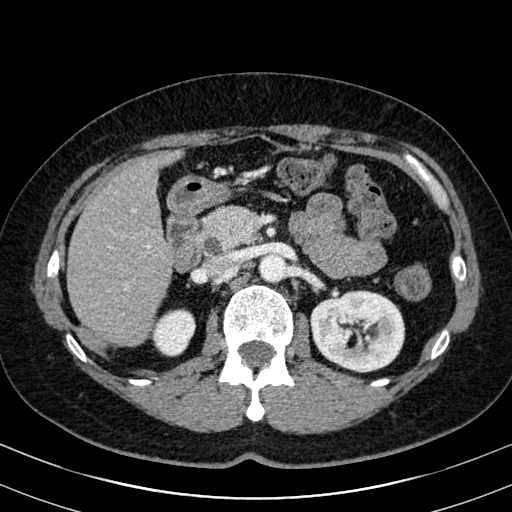
[im 13/174  lung]
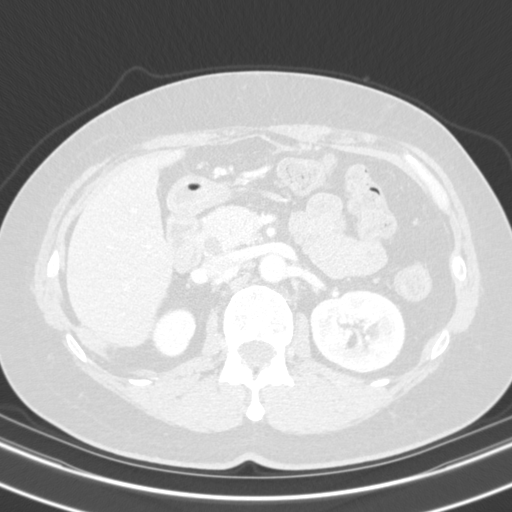
[im 26/174  lung]
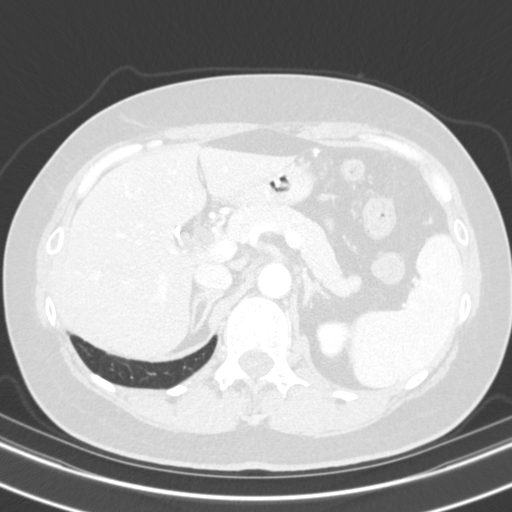
[im 35/174  lung]
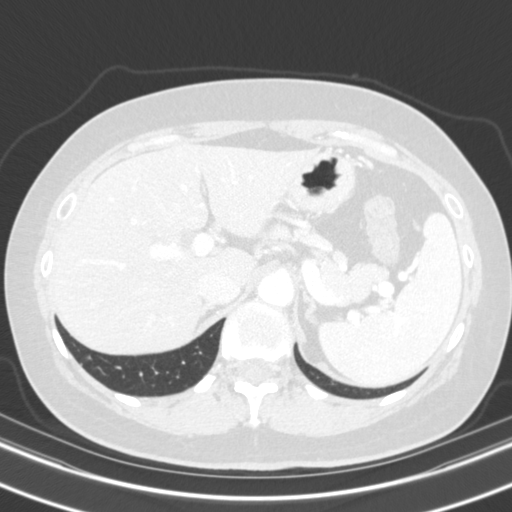
[im 45/174  lung]
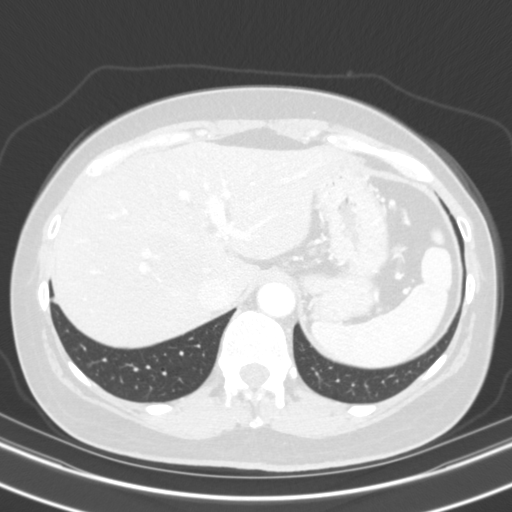
[im 58/174  mediastinal]
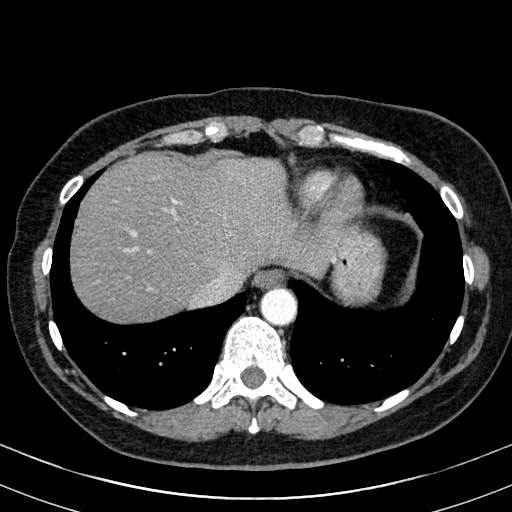
[im 58/174  lung]
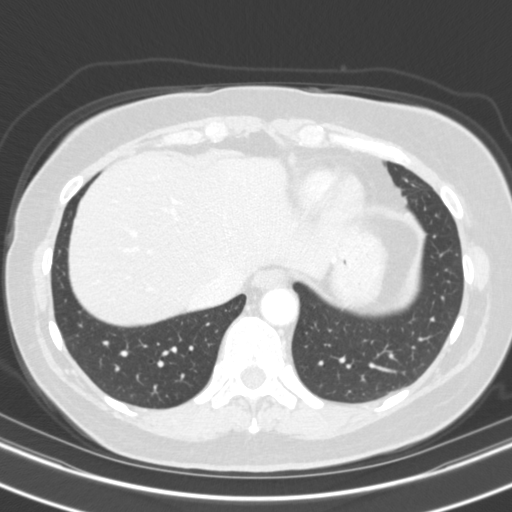
[im 70/174  lung]
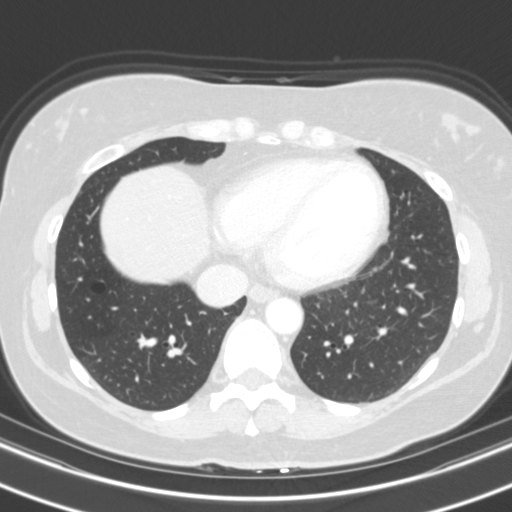
[im 77/174  lung]
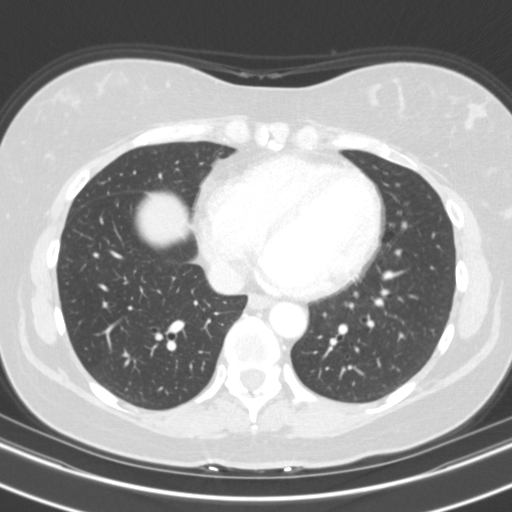
[im 90/174  lung]
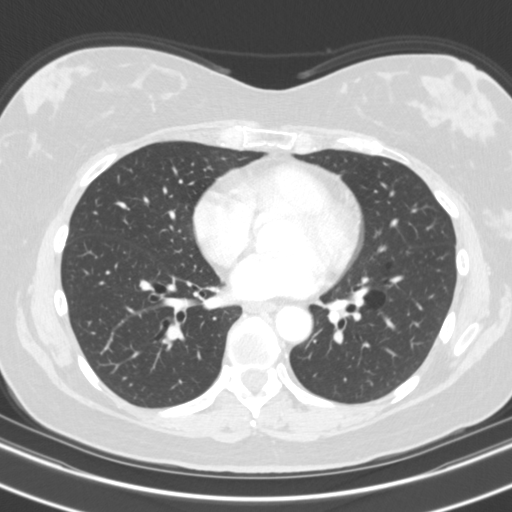
[im 97/174  mediastinal]
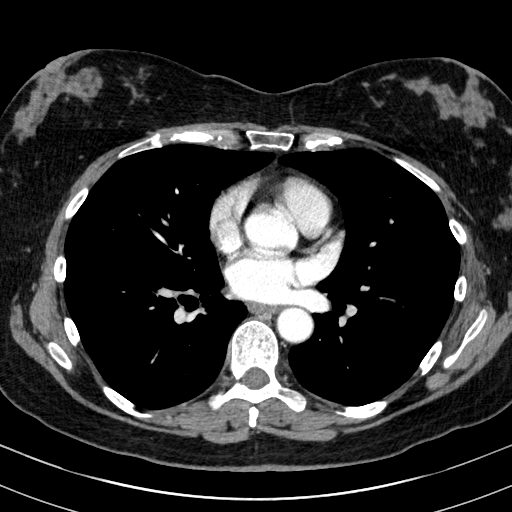
[im 97/174  lung]
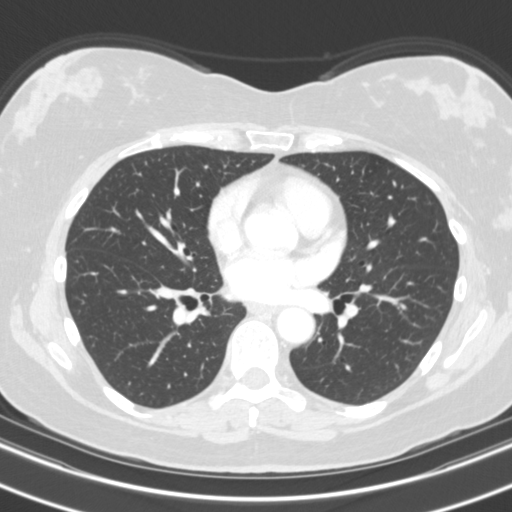
[im 104/174  lung]
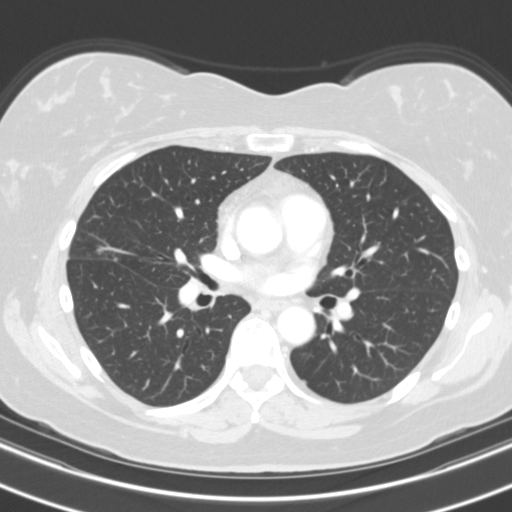
[im 116/174  lung]
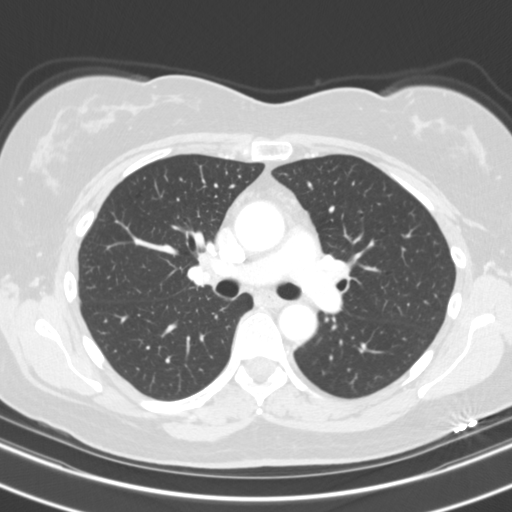
[im 129/174  lung]
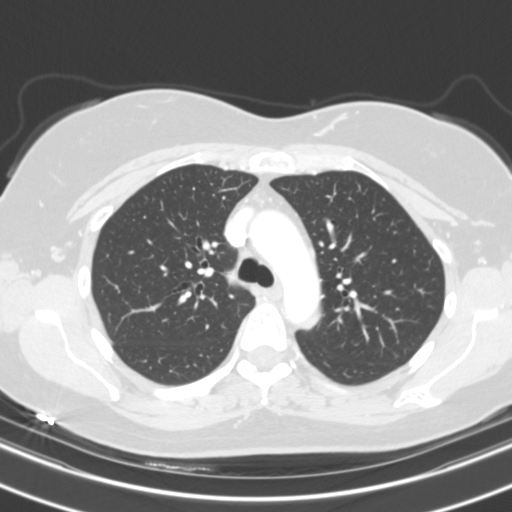
[im 139/174  mediastinal]
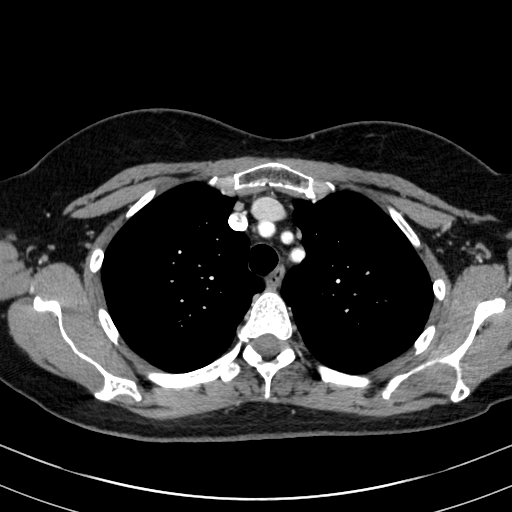
[im 139/174  lung]
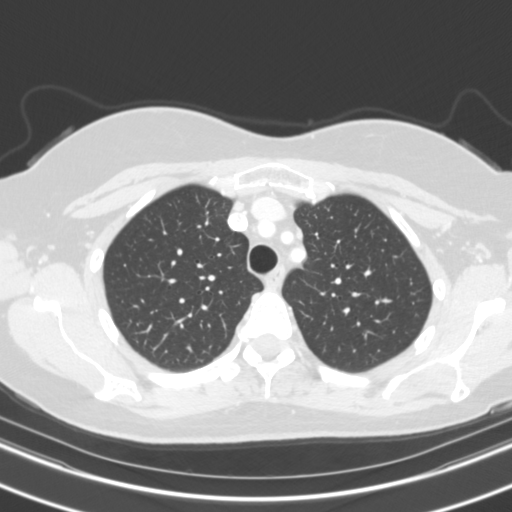
[im 148/174  lung]
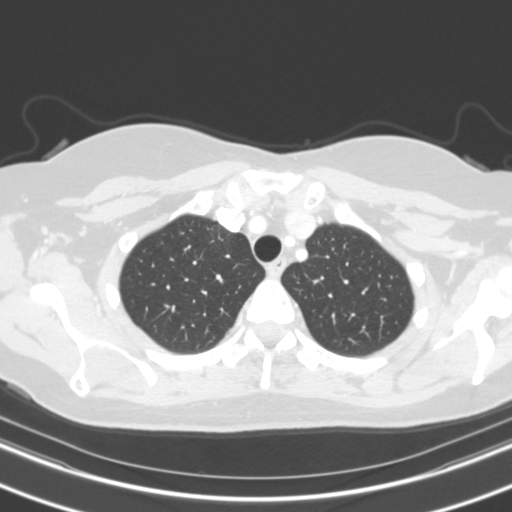
[im 161/174  lung]
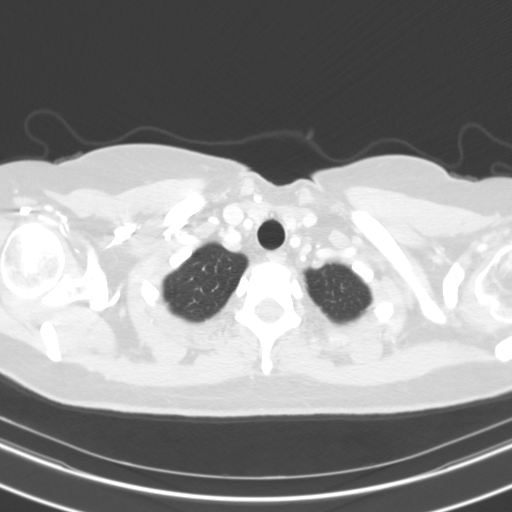

[15 of 34 positions shown; findings below may reference images not displayed]

FINDINGS: Cardiovascular: No significant vascular findings. Normal heart size.
No pericardial effusion.

Mediastinum/Nodes: Multiple thyroid nodules are unchanged, the
largest measuring 1 cm. No mediastinal mass or enlarged lymph nodes
identified. The visualized esophagus and trachea are unremarkable.

Lungs/Pleura: A stable 0.6 x 2 x 1.2 cm nodular opacity (AP x
transverse x CC) within the posterolaterosuperior aspect of the
RIGHT middle lobe noted. A few calcifications INFERIOR and MEDIAL to
this nodular opacity is identified.

No other pulmonary nodule, mass, airspace disease, consolidation,
pleural effusion or pneumothorax identified.

Upper Abdomen: No acute abnormality.

Musculoskeletal: No acute or suspicious bony abnormalities
identified.
IMPRESSION: 1. Stable 0.6 x 2 x 1.2 cm RIGHT middle lobe opacity for 1 month.
Given no improvement in 1 month and no symptoms of infection,
consider one of the following in 3 months for both low-risk and
high-risk individuals: (a) repeat chest CT, (b) follow-up PET-CT, or
(c) tissue sampling. This recommendation follows the consensus
statement: Guidelines for Management of Incidental Pulmonary Nodules
Detected on CT Images: From the [HOSPITAL] 2595; Radiology

## 2021-07-22 ENCOUNTER — Other Ambulatory Visit: Payer: Self-pay | Admitting: Family

## 2021-07-22 DIAGNOSIS — F32A Depression, unspecified: Secondary | ICD-10-CM

## 2021-07-22 DIAGNOSIS — F419 Anxiety disorder, unspecified: Secondary | ICD-10-CM

## 2021-07-24 NOTE — Telephone Encounter (Signed)
This is not my pt.. needs appt with her new provider for refill... or if she has appt upcoming with me.. move appt sooner.

## 2021-07-24 NOTE — Telephone Encounter (Signed)
Please call patient and have her schedule an establish care appointment with one of the new NPs.  Needs appointment before refill can be given.   Dr. Diona Browner was listed as her PCP but she is now her patient.

## 2021-07-25 NOTE — Telephone Encounter (Signed)
Called patient to get her scheduled wIth a new PCP

## 2021-07-28 NOTE — Telephone Encounter (Signed)
I went ahead a denied Rx with note to have patient contract providers office.

## 2021-07-28 NOTE — Telephone Encounter (Signed)
LMTCB to schedule a TOC appt

## 2021-07-28 NOTE — Telephone Encounter (Signed)
[place] office has left 2 message for patient to call back to schedule TOC appointment.  Patient has not returned call.  Refill or Deny?

## 2021-08-21 DIAGNOSIS — H9071 Mixed conductive and sensorineural hearing loss, unilateral, right ear, with unrestricted hearing on the contralateral side: Secondary | ICD-10-CM | POA: Diagnosis not present

## 2021-08-21 DIAGNOSIS — H9041 Sensorineural hearing loss, unilateral, right ear, with unrestricted hearing on the contralateral side: Secondary | ICD-10-CM | POA: Diagnosis not present

## 2021-09-03 ENCOUNTER — Other Ambulatory Visit: Payer: Self-pay | Admitting: Family

## 2021-09-03 DIAGNOSIS — F419 Anxiety disorder, unspecified: Secondary | ICD-10-CM

## 2021-10-02 ENCOUNTER — Telehealth (INDEPENDENT_AMBULATORY_CARE_PROVIDER_SITE_OTHER): Payer: BC Managed Care – PPO | Admitting: Family Medicine

## 2021-10-02 ENCOUNTER — Encounter: Payer: Self-pay | Admitting: Family Medicine

## 2021-10-02 VITALS — Temp 99.7°F | Ht 66.0 in

## 2021-10-02 DIAGNOSIS — J019 Acute sinusitis, unspecified: Secondary | ICD-10-CM | POA: Diagnosis not present

## 2021-10-02 MED ORDER — AZITHROMYCIN 250 MG PO TABS: ORAL_TABLET | ORAL | 0 refills | Status: DC

## 2021-10-02 NOTE — Progress Notes (Signed)
VIRTUAL VISIT Due to national recommendations of social distancing due to [place] 19, a virtual visit is felt to be most appropriate for this patient at this time.   I connected with the patient on 10/02/21 at 10:00 AM EST by virtual telehealth platform and verified that I am speaking with the correct person using two identifiers.   I discussed the limitations, risks, security and privacy concerns of performing an evaluation and management service by  virtual telehealth platform and the availability of in person appointments. I also discussed with the patient that there may be a patient responsible charge related to this service. The patient expressed understanding and agreed to proceed.  Patient location: Home Provider Location: [facility] [place] Participants: Eliezer Lofts and [name]   Chief Complaint  Patient presents with   Nasal Congestion    Negative Home Covid Test this morning Symptoms started on Monday    Fever    Low grade   Cough    From the drainage    History of Present Illness:   51 year old female patient  with history of  Sjogren's disease presents with new onset nasal congestion, fever and cough.  Date of onset: 09/29/2021  She reports symptoms started with runny nose,  now progressed to fatigue, low grade fever 99.35F, darker yellow mucus. Mild sinus pressure, no ear pain, mild scratchy throat.  No myalgia.  No  SOB.   She has been using tylenol, OTC guaifenesin.  Using humidifier at night.  Netty pot.  Occ using Allecort 2 spray per nostril daily.  Husband has been sick off and on since Christmas.   SE to prednisone in past.   Hx of chronic sinusitis.  COVID 19 screen COVID testing: negative this AM on day 4 COVID vaccine: NONE COVID exposure: No recent travel or known exposure to [place]  The importance of social distancing was discussed today.    Review of Systems  Constitutional:  Positive for fever. Negative for chills.  HENT:   Positive for congestion, sinus pain and sore throat. Negative for ear pain.   Eyes:  Negative for pain and redness.  Respiratory:  Positive for cough. Negative for shortness of breath.   Cardiovascular:  Negative for chest pain, palpitations and leg swelling.  Gastrointestinal:  Negative for abdominal pain, blood in stool, constipation, diarrhea, nausea and vomiting.  Genitourinary:  Negative for dysuria.  Musculoskeletal:  Negative for falls and myalgias.  Skin:  Negative for rash.  Neurological:  Negative for dizziness.  Psychiatric/Behavioral:  Negative for depression. The patient is not nervous/anxious.      Past Medical History:  Diagnosis Date   Anal fissure    Arthralgia of temporomandibular joint    MIGRAINES AND REGULAR   Calculus of gallbladder without mention of cholecystitis or obstruction    Complication of anesthesia    Depression    Dysrhythmia    RAPID HEARTBEAT AND SKIPPED BEATS ON METOPROLOL   Family history of adverse reaction to anesthesia    FAMILY MEMBERS HAVE PONV   Fibromyalgia    GERD (gastroesophageal reflux disease)    Heart murmur    IN PAST, TOLD IS GONE NOW   History of kidney stones    History of nephrolithiasis    HLD (hyperlipidemia)    PONV (postoperative nausea and vomiting)    Sjogren - Larsson's syndrome    Sjogren's syndrome ([place])     reports that she has never smoked. She has never used smokeless tobacco. She  reports current alcohol use. She reports that she does not use drugs.   Current Outpatient Medications:    acetaminophen (TYLENOL) 500 MG tablet, Take 1,000 mg by mouth every 6 (six) hours as needed for moderate pain or headache. , Disp: , Rfl:    albuterol (VENTOLIN HFA) 108 (90 Base) MCG/ACT inhaler, INHALE 1 TO 2 PUFFS INTO THE LUNGS EVERY 4 HOURS AS NEEDED FOR WHEEZING OR SHORTNESS OF BREATH OR COUGH, Disp: 6.7 g, Rfl: 0   ascorbic acid (VITAMIN C) 500 MG tablet, Take 500 mg by mouth daily., Disp: , Rfl:     aspirin-acetaminophen-caffeine (EXCEDRIN MIGRAINE) 250-250-65 MG tablet, Take 1 tablet by mouth every 8 (eight) hours as needed for headache or migraine., Disp: , Rfl:    calcium carbonate (TUMS - DOSED IN MG ELEMENTAL CALCIUM) 500 MG chewable tablet, Chew 500 mg by mouth 3 (three) times daily as needed for indigestion or heartburn. , Disp: , Rfl:    Carboxymethylcellul-Glycerin (LUBRICATING EYE DROPS OP), Place 1 drop into both eyes daily as needed (dry eyes)., Disp: , Rfl:    cholecalciferol (VITAMIN D) 1000 UNITS tablet, Take 1,000 Units by mouth every evening., Disp: , Rfl:    diphenhydramine-acetaminophen (TYLENOL PM) 25-500 MG TABS tablet, Take 1 tablet by mouth at bedtime as needed (sleep). , Disp: , Rfl:    escitalopram (LEXAPRO) 20 MG tablet, TAKE 1 TABLET BY MOUTH  DAILY, Disp: 90 tablet, Rfl: 3   fluticasone (FLONASE) 50 MCG/ACT nasal spray, Place 1 spray into both nostrils daily., Disp: , Rfl:    metoprolol succinate (TOPROL-XL) 25 MG 24 hr tablet, TAKE ONE-HALF TABLET BY  MOUTH DAILY, Disp: 45 tablet, Rfl: 3   neomycin-bacitracin-polymyxin (NEOSPORIN) ointment, Apply 1 application topically as needed for wound care., Disp: , Rfl:    pantoprazole (PROTONIX) 40 MG tablet, TAKE 1 TABLET BY MOUTH  DAILY, Disp: 90 tablet, Rfl: 1   Probiotic Product (PROBIOTIC DAILY PO), Take 1 capsule by mouth every evening. , Disp: , Rfl:    vitamin B-12 (CYANOCOBALAMIN) 1000 MCG tablet, Take 1,000 mcg by mouth every evening. , Disp: , Rfl:    zinc gluconate 50 MG tablet, Take 50 mg by mouth daily., Disp: , Rfl:    Observations/Objective: Temperature 99.7 F (37.6 C), temperature source Oral, height 5\' 6"  (1.676 m), last menstrual period 07/03/2018.  Physical Exam  Physical Exam Constitutional:      General: The patient is not in acute distress. Pulmonary:     Effort: Pulmonary effort is normal. No respiratory distress.  Neurological:     Mental Status: The patient is alert and oriented to person,  place, and time.  Psychiatric:        Mood and Affect: Mood normal.        Behavior: Behavior normal.   Assessment and Plan Problem List Items Addressed This Visit     Acute non-recurrent sinusitis - Primary     Most likely viral URI  Continue nasal steroid,  netty pot, tylenol prn.  If not improving by day 7-10 of illness fill antibiotics for possible bacterial infection.  She has hx of chronic sinusitis. Amox has not helped in past.       Relevant Medications   azithromycin (ZITHROMAX) 250 MG tablet      I discussed the assessment and treatment plan with the patient. The patient was provided an opportunity to ask questions and all were answered. The patient agreed with the plan and demonstrated an understanding of the instructions.  The patient was advised to call back or seek an in-person evaluation if the symptoms worsen or if the condition fails to improve as anticipated.     Eliezer Lofts, MD

## 2021-10-02 NOTE — Assessment & Plan Note (Signed)
Most likely viral URI  Continue nasal steroid,  netty pot, tylenol prn.  If not improving by day 7-10 of illness fill antibiotics for possible bacterial infection.  She has hx of chronic sinusitis. Amox has not helped in past.

## 2021-10-16 IMAGING — US US FNA BIOPSY THYROID 1ST LESION
1 series · 13 of 25 positions shown · non-contrast
Comparison: Ultrasound thyroid dated 12/05/2019

MEDICATIONS:
5 mL 1% lidocaine

COMPLICATIONS:
None immediate.

INDICATION: Patient with incidental finding of hypermetabolic bilateral thyroid
nodules on recent PET scan which was performed for evaluation of
right lower lobe lung nodule. Request for fine-needle aspiration of
indeterminate right mid and left inferior thyroid nodules.

EXAM:
ULTRASOUND GUIDED FINE NEEDLE ASPIRATION OF INDETERMINATE THYROID
NODULE X2
TECHNIQUE: Informed written consent was obtained from the patient after a
discussion of the risks, benefits and alternatives to treatment.
Questions regarding the procedure were encouraged and answered. A
timeout was performed prior to the initiation of the procedure.

[Series 1: us fna biopsy thyroid 1st lesion · 0.04mm/px · 35 acquisitions, 13 frames shown]
[im 1/35]
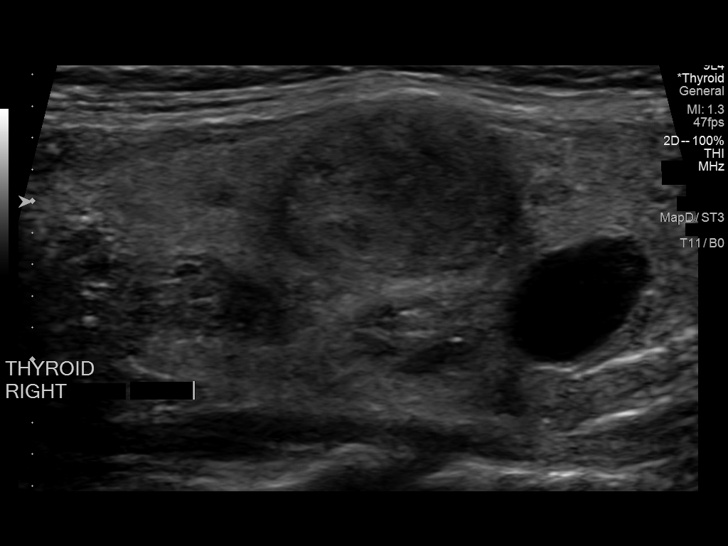
[im 3/35]
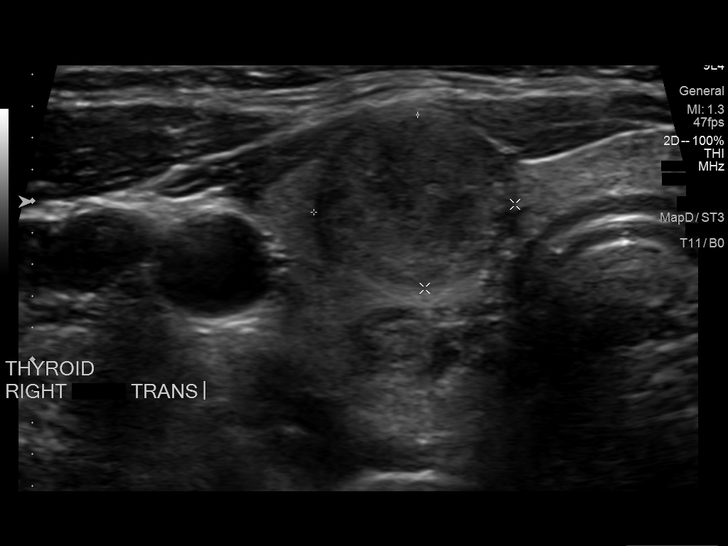
[im 6/35]
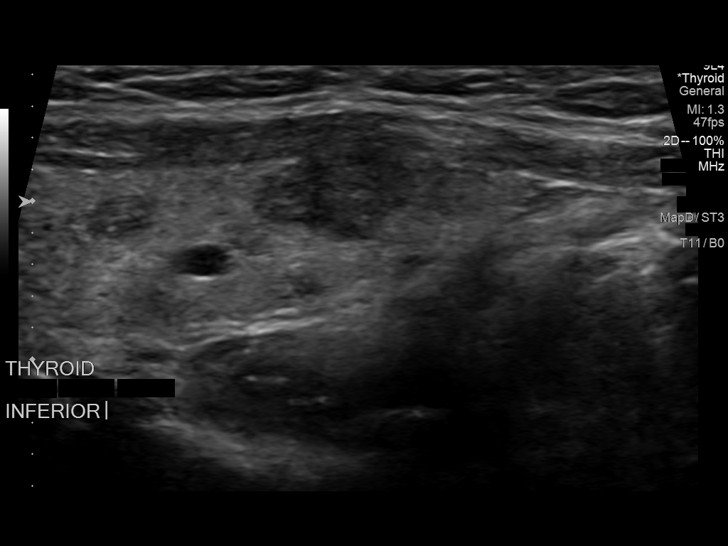
[im 9/35]
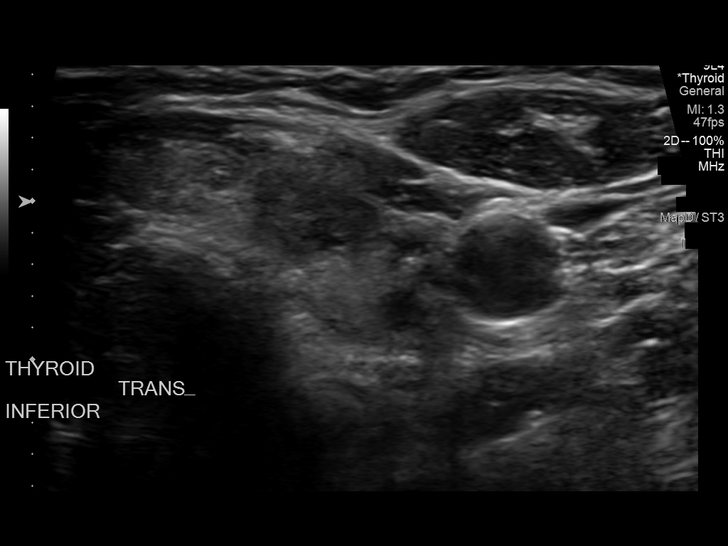
[im 12/35]
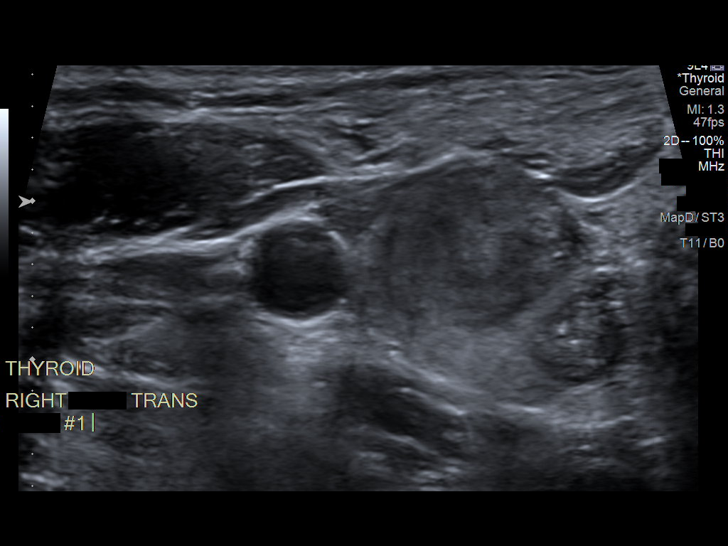
[im 15/35]
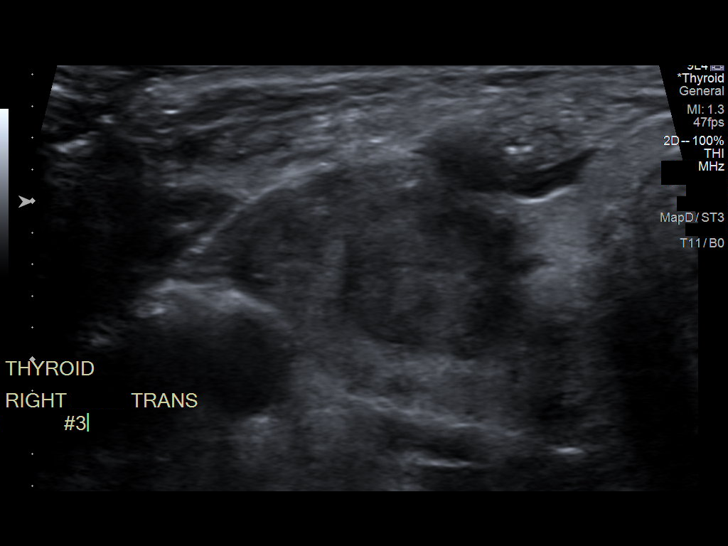
[im 18/35]
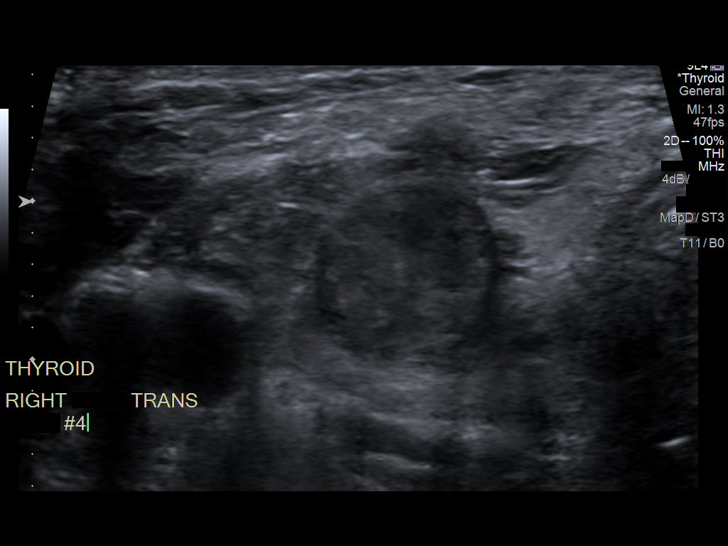
[im 20/35]
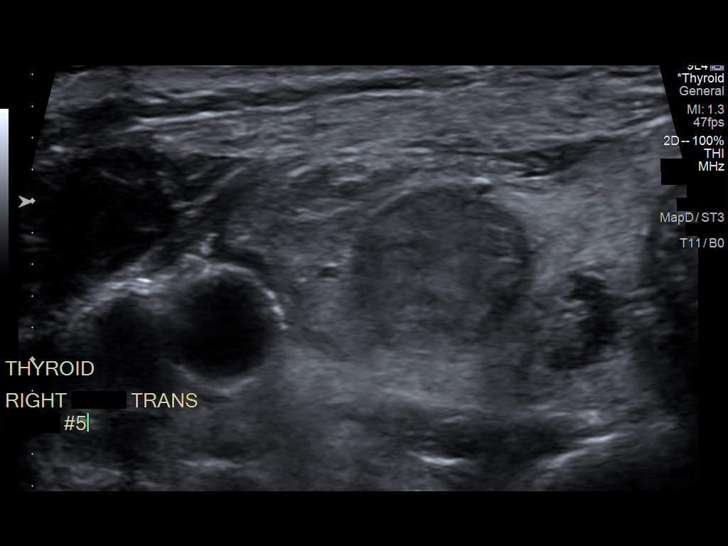
[im 23/35]
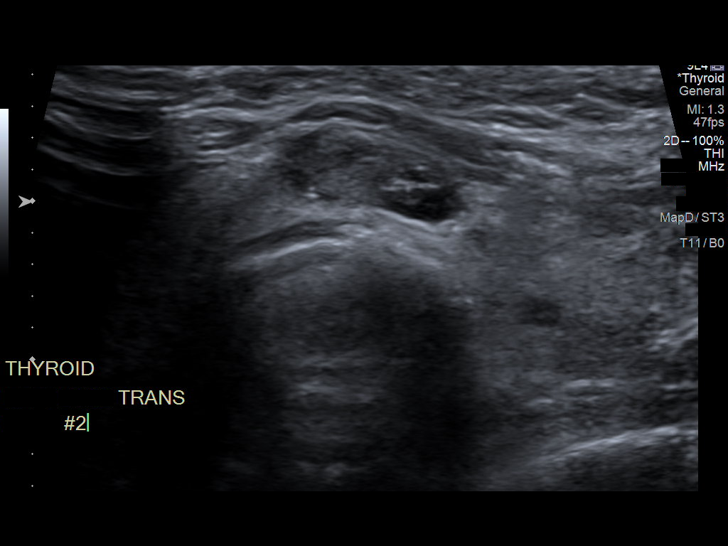
[im 26/35]
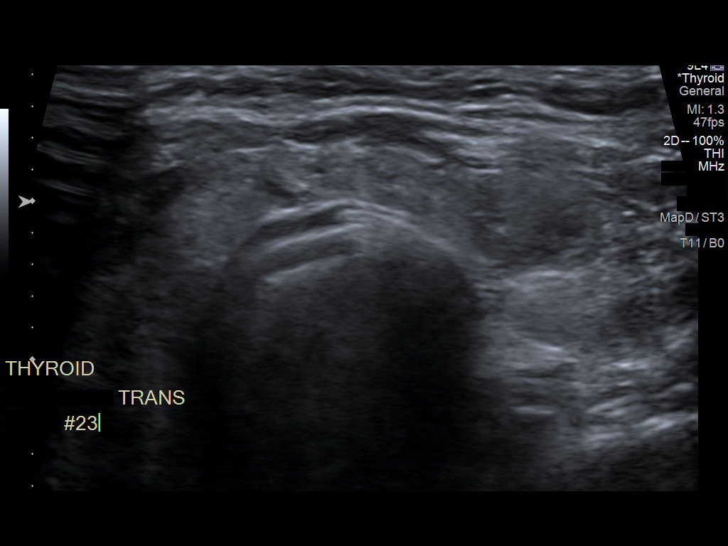
[im 29/35]
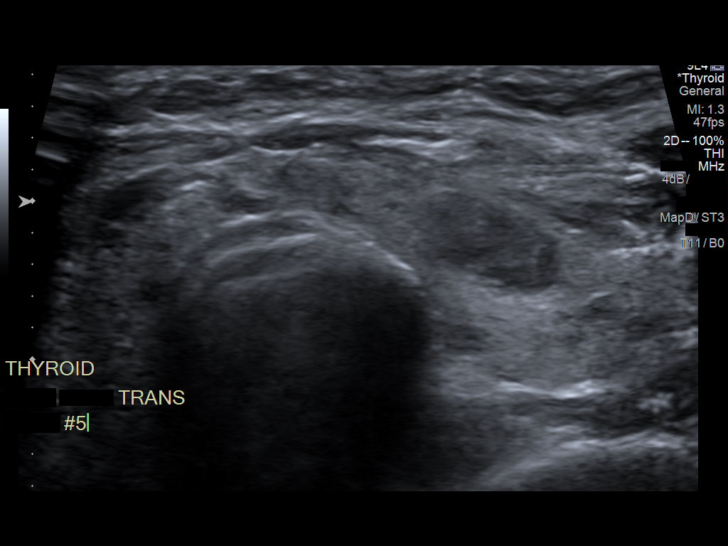
[im 32/35]
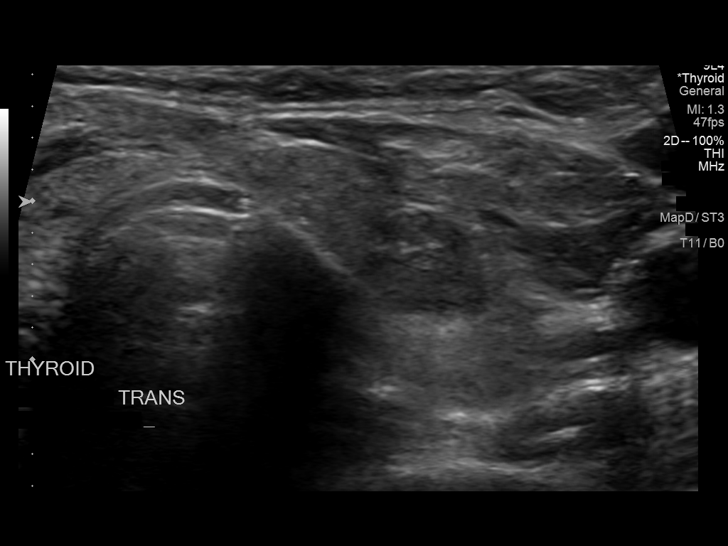
[im 35/35]
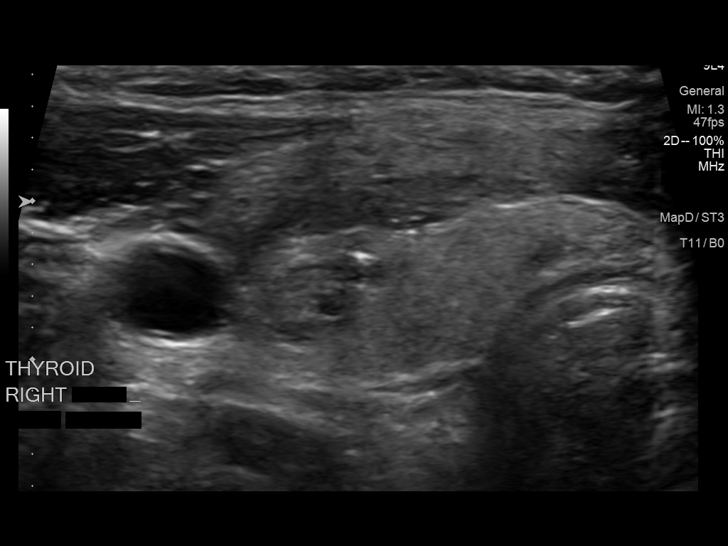

[13 of 25 positions shown; findings below may reference images not displayed]

Pre-procedural ultrasound scanning demonstrated unchanged size and
appearance of the indeterminate nodules within the right mid thyroid
lobe and left inferior thyroid lobe.

The procedure was planned. The neck was prepped in the usual sterile
fashion, and a sterile drape was applied covering the operative
field. A timeout was performed prior to the initiation of the
procedure. Local anesthesia was provided with 1% lidocaine.

Under direct ultrasound guidance, 5 FNA biopsies were performed of
the indeterminate right mid thyroid nodule with a 25 gauge needle, 2
which were sent for Afirma testing. Multiple ultrasound images were
saved for procedural documentation purposes. The samples were
prepared and submitted to pathology.

Under direct ultrasound guidance, 5 FNA biopsies were performed of
the indeterminate left inferior thyroid nodule with a 25 gauge
needle, 2 of which were sent for Afirma testing. Multiple ultrasound
images were saved for procedural documentation purposes. The samples
were prepared and submitted to pathology.

Limited post procedural scanning was negative for hematoma or
additional complication. Dressings were placed. The patient
tolerated the above procedures procedure well without immediate
postprocedural complication.
FINDINGS: Nodule reference number based on prior diagnostic ultrasound: 2

Maximum size: 1.6 cm

Location: Right; Mid

ACR TI-RADS risk category: TR4 (4-6 points)

Reason for biopsy: meets ACR TI-RADS criteria

_________________________________________________________

Nodule reference number based on prior diagnostic ultrasound: 6

Maximum size: 1.2 cm

Location: Left; Inferior

ACR TI-RADS risk category: TR4 (4-6 points)

Reason for biopsy: risk factors

Ultrasound imaging confirms appropriate placement of the needles
within the thyroid nodule.
IMPRESSION: 1. Technically successful ultrasound guided fine needle aspiration
of indeterminate right mid thyroid nodule.
2. Technically successful ultrasound guided fine needle aspiration
of indeterminate left inferior thyroid nodule.

Read by Eradi, Lalayla

## 2021-10-21 NOTE — Telephone Encounter (Signed)
Returning you call to schedule [facility]

## 2021-10-23 NOTE — Telephone Encounter (Signed)
Returning a miss call to schedule [facility]

## 2021-11-03 ENCOUNTER — Ambulatory Visit: Primary: Surgery

## 2021-11-03 ENCOUNTER — Ambulatory Visit: Admit: 2021-11-03 | Payer: BLUE CROSS/BLUE SHIELD | Primary: Surgery

## 2021-11-03 DIAGNOSIS — G5603 Carpal tunnel syndrome, bilateral upper limbs: Secondary | ICD-10-CM

## 2021-11-03 NOTE — Progress Notes (Signed)
Progress Notes by Lance Bosch, MD at 11/03/21 1000                Author: Lance Bosch, MD  Service: --  Author Type: Physician       Filed: 11/03/21 1227  Encounter Date: 11/03/2021  Status: Signed          Editor: Lance Bosch, MD (Physician)                       [facility]   [facility]   [address] [place], [address]   [place] [address]   Phone [phone] Fax [phone]   Test Date:  11/03/2021             Patient:  Elaine Boyd  DOB:  1971/01/01  Physician:  Wells Guiles, MD            Sex:  Female  Height:  ' 0"  Ref Phys:  Maxie Barb, MD            ID#:  [id number]   Weight:  248 lbs.  Technician:  Hannah Beat        Patient Complaints:         Patient History / Exam:   51 year old female who is being evaluated for numbness, tingling, pain in both hands, right worse than left.  Symptoms have been interrupting her sleep at night.  She has been a bus driver for over  2 decades.         NCV & EMG Findings:   Evaluation of the left median motor and the right median motor nerves showed prolonged distal onset latency (L6.2, R5.2 ms).  The left median sensory and the right median sensory nerves showed prolonged  distal peak latency (L5.1, R4.9 ms) and decreased conduction velocity (Wrist-2nd Digit, L27, R29 m/s).  The left median/ulnar (palm) comparison nerve showed abnormal peak latency difference (Median Palm-Ulnar Palm, 0.6 ms).  The right median/ulnar (palm)  comparison nerve showed prolonged distal peak latency (Median Palm, 3.2 ms) and abnormal peak latency difference (Median Palm-Ulnar Palm, 1.5 ms).  All remaining nerves  were within normal limits.        All F Wave latencies were within normal limits.  All F Wave left vs. right side latency differences were within normal limits.        All examined muscles (as indicated in the following table) showed no evidence of electrical instability.        Impression:   The  electrodiagnostic testing is suggestive of bilateral entrapment median mononeuropathy i.e. carpal tunnel syndrome.  This is of moderate severity bilaterally, right slightly worse than left.  No denervation was seen in median innervated muscles.      Recommendations:   Consider carpal tunnel release      ___________________________   Wells Guiles, MD           Nerve Conduction Studies   Anti Sensory Summary Table                     Stim Site  NR  Peak (ms)  Norm Peak (ms)  P-T Amp (??V)  Norm P-T Amp  Onset (ms)  Site1  Site2  Delta-P (ms)  Dist (cm)  Vel (m/s)  Norm Vel (m/s)       Left Median Anti Sensory (2nd Digit)  32.6??C  Wrist      5.1  <3.6  15.0  >10  3.8  Wrist  2nd Digit  5.1  14.0  27  >39       Right Median Anti Sensory (2nd Digit)  32.9??C                   Wrist      4.9  <3.6  10.6  >10  4.0  Wrist  2nd Digit  4.9  14.0  29  >39       Left Radial Anti Sensory (Base 1st Digit)  33.1??C                   Wrist      1.9  <3.1  38.0    1.6  Wrist  Base 1st Digit  1.9  0.0           Right Radial Anti Sensory (Base 1st Digit)  33.6??C                   Wrist      2.0  <3.1  43.8    1.5  Wrist  Base 1st Digit  2.0  0.0           Left Ulnar Anti Sensory (5th Digit)  33.1??C                   Wrist      3.5  <3.7  16.7  >15.0  3.1  Wrist  5th Digit  3.5  14.0  40  >38       Right Ulnar Anti Sensory (5th Digit)  33.5??C                   Wrist      2.6  <3.7  19.3  >15.0  2.3  Wrist  5th Digit  2.6  14.0  54  >38        Motor Summary Table                    Stim Site  NR  Onset (ms)  Norm Onset (ms)  O-P Amp (mV)  Norm O-P Amp  Site1  Site2  Delta-0 (ms)  Dist (cm)  Vel (m/s)  Norm Vel (m/s)       Left Median Motor (Abd Poll Brev)  33.3??C                  Wrist      6.2  <4.2  5.2  >5  Elbow  Wrist  4.4  27.0  61  >50                  Elbow      10.6    4.5                     Right Median Motor (Abd Poll Brev)  33.7??C                  Wrist      5.2  <4.2  6.1  >5  Elbow  Wrist  4.6  27.0  59   >50                  Elbow      9.8    5.9  Left Ulnar Motor (Abd Dig Minimi)  33.9??C                  Wrist      2.8  <4.2  9.8  >3  B Elbow  Wrist  2.7  16.0  59  >53     B Elbow      5.5    9.5    A Elbow  B Elbow  1.9  10.0  53  >53     A Elbow      7.4    8.7                     Right Ulnar Motor (Abd Dig Minimi)  34.1??C                  Wrist      2.6  <4.2  9.0  >3  B Elbow  Wrist  2.5  16.0  64  >53     B Elbow      5.1    8.6    A Elbow  B Elbow  1.8  10.0  56  >53                  A Elbow      6.9    8.4                      Comparison Summary Table                 Stim Site  NR  Peak (ms)  Norm Peak (ms)  P-T Amp (??V)  Site1  Site2  Delta-P (ms)  Norm Delta (ms)       Left Median/Ulnar Palm Comparison (Wrist - 8cm)  34.1??C               Median Palm      2.1  <2.5  2.6  Median Palm  Ulnar Palm  0.6  <0.3               Ulnar Palm      1.5  <2.5  12.7               Right Median/Ulnar Palm Comparison (Wrist - 8cm)  34.1??C               Median Palm      3.2  <2.5  11.7  Median Palm  Ulnar Palm  1.5  <0.3               Ulnar Palm      1.7  <2.5  15.2                F Wave Studies             NR  F-Lat (ms)  Lat Norm (ms)  L-R F-Lat (ms)  L-R Lat Norm       Left Ulnar (Mrkrs) (Abd Dig Min)  34.3??C               27.71  <36  2.36  <2.5       Right Ulnar (Mrkrs) (Abd Dig Min)  34.3??C               25.36  <36  2.36  <2.5        EMG  Side  Muscle  Nerve  Root  Ins Act  Fibs  Psw  Amp  Dur  Poly  Recrt  Int Dennie BiblePat  Comment                   Left  Abd Poll Brev  Median  C8-T1  Nml  Nml  Nml  Nml  Nml  0  Nml  Nml       Left  1stDorInt  Ulnar  C8-T1  Nml  Nml  Nml  Nml  Nml  0  Nml  Nml       Left  BrachioRad  Radial  C5-6  Nml  Nml  Nml  Nml  Nml  0  Nml  Nml       Left  PronatorTeres  Median  C6-7  Nml  Nml  Nml  Nml  Nml  0  Nml  Nml       Left  Triceps  Radial  C6-7-8  Nml  Nml  Nml  Nml  Nml  0  Nml  Nml       Left  Deltoid  Axillary  C5-6  Nml  Nml  Nml  Nml  Nml  0  Nml  Nml        Left  Cervical Parasp Mid  Rami  C4-6  Nml  Nml  Nml                 Right  Abd Poll Brev  Median  C8-T1  Nml  Nml  Nml  Nml  Nml  0  Nml  Nml       Right  1stDorInt  Ulnar  C8-T1  Nml  Nml  Nml  Nml  Nml  0  Nml  Nml       Right  BrachioRad  Radial  C5-6  Nml  Nml  Nml  Nml  Nml  0  Nml  Nml       Right  PronatorTeres  Median  C6-7  Nml  Nml  Nml  Nml  Nml  0  Nml  Nml       Right  Triceps  Radial  C6-7-8  Nml  Nml  Nml  Nml  Nml  0  Nml  Nml                     Right  Deltoid  Axillary  C5-6  Nml  Nml  Nml  Nml  Nml  0  Nml  Nml               Waveforms:

## 2021-12-04 DIAGNOSIS — R208 Other disturbances of skin sensation: Secondary | ICD-10-CM | POA: Diagnosis not present

## 2021-12-04 DIAGNOSIS — D2372 Other benign neoplasm of skin of left lower limb, including hip: Secondary | ICD-10-CM | POA: Diagnosis not present

## 2021-12-04 DIAGNOSIS — L814 Other melanin hyperpigmentation: Secondary | ICD-10-CM | POA: Diagnosis not present

## 2021-12-04 DIAGNOSIS — L821 Other seborrheic keratosis: Secondary | ICD-10-CM | POA: Diagnosis not present

## 2021-12-04 DIAGNOSIS — D2322 Other benign neoplasm of skin of left ear and external auricular canal: Secondary | ICD-10-CM | POA: Diagnosis not present

## 2021-12-04 DIAGNOSIS — L820 Inflamed seborrheic keratosis: Secondary | ICD-10-CM | POA: Diagnosis not present

## 2021-12-04 DIAGNOSIS — L298 Other pruritus: Secondary | ICD-10-CM | POA: Diagnosis not present

## 2021-12-04 DIAGNOSIS — L538 Other specified erythematous conditions: Secondary | ICD-10-CM | POA: Diagnosis not present

## 2021-12-12 ENCOUNTER — Other Ambulatory Visit: Payer: Self-pay | Admitting: Family Medicine

## 2021-12-16 ENCOUNTER — Ambulatory Visit: Payer: BC Managed Care – PPO | Admitting: Cardiovascular Disease

## 2021-12-22 ENCOUNTER — Other Ambulatory Visit: Payer: Self-pay | Admitting: Surgery

## 2021-12-22 DIAGNOSIS — E042 Nontoxic multinodular goiter: Secondary | ICD-10-CM

## 2021-12-26 DIAGNOSIS — E042 Nontoxic multinodular goiter: Secondary | ICD-10-CM | POA: Diagnosis not present

## 2021-12-29 ENCOUNTER — Ambulatory Visit
Admission: RE | Admit: 2021-12-29 | Discharge: 2021-12-29 | Disposition: A | Discharge status: Home / Self Care | Payer: BC Managed Care – PPO | Source: Ambulatory Visit | Attending: Surgery | Admitting: Surgery

## 2021-12-29 DIAGNOSIS — E042 Nontoxic multinodular goiter: Secondary | ICD-10-CM | POA: Diagnosis not present

## 2022-01-01 DIAGNOSIS — E042 Nontoxic multinodular goiter: Secondary | ICD-10-CM | POA: Insufficient documentation

## 2022-01-06 ENCOUNTER — Encounter: Payer: Self-pay | Admitting: Cardiovascular Disease

## 2022-01-06 ENCOUNTER — Ambulatory Visit: Payer: BC Managed Care – PPO | Admitting: Cardiovascular Disease

## 2022-01-06 VITALS — BP 117/67 | HR 69 | Ht 67.0 in | Wt 162.4 lb

## 2022-01-06 DIAGNOSIS — R002 Palpitations: Secondary | ICD-10-CM | POA: Diagnosis not present

## 2022-01-06 DIAGNOSIS — I059 Rheumatic mitral valve disease, unspecified: Secondary | ICD-10-CM

## 2022-01-06 NOTE — Progress Notes (Signed)
?  ?Cardiology Office Note ? ? ?Date:  01/06/2022  ? ?ID:  KENADIE Leonard, DOB 16-Dec-1970, MRN [id number] ? ?PCP:  Jinny Sanders, MD  ?Cardiologist:   Kathlyn Sacramento, MD  ? ?No chief complaint on file. ? ? ?  ?History of Present Illness: ?Jill Leonard is a 51 y.o. female who presents for a follow-up visit regarding palpitations and tachycardia. She has known history of Sjogren's syndrome.  ?Echocardiogram in January 2016 showed normal LV systolic function with an ejection fraction of 60% with borderline prolapse of anterior mitral valve leaflet with only mild mitral regurgitation and no evidence of pulmonary hypertension. ?A Holter monitor showed normal sinus rhythm with occasional PVCs and PACs. She had a total of 445 PVCs and 1200 PACs. ?A treadmill stress test in June of 2016 was normal. ?she was started on small dose Toprol with improvement in palpitations. She also cut down on caffeine intake.  ?Most recent echocardiogram in May 2022 showed normal LV systolic function with trivial mitral regurgitation. ? ?She has been doing well with no chest pain, shortness of breath or palpitations. ? ?Past Medical History:  ?Diagnosis Date  ? Anal fissure   ? Arthralgia of temporomandibular joint   ? MIGRAINES AND REGULAR  ? Calculus of gallbladder without mention of cholecystitis or obstruction   ? Complication of anesthesia   ? Depression   ? Dysrhythmia   ? RAPID HEARTBEAT AND SKIPPED BEATS ON METOPROLOL  ? Family history of adverse reaction to anesthesia   ? FAMILY MEMBERS HAVE PONV  ? Fibromyalgia   ? GERD (gastroesophageal reflux disease)   ? Heart murmur   ? IN PAST, TOLD IS GONE NOW  ? History of kidney stones   ? History of nephrolithiasis   ? HLD (hyperlipidemia)   ? PONV (postoperative nausea and vomiting)   ? Sjogren - Larsson's syndrome   ? Sjogren's syndrome ([place])   ? ? ?Past Surgical History:  ?Procedure Laterality Date  ? ABDOMINAL HYSTERECTOMY    ? CHOLECYSTECTOMY  08/20/08  ? MANDIBLE SURGERY    ?  VENTRAL HERNIA REPAIR N/A 06/12/2019  ? Procedure: VENTRAL HERNIA REPAIR WITH MESH;  Surgeon: Jovita Kussmaul, MD;  Location: [facility];  Service: General;  Laterality: N/A;  ? WISDOM TOOTH EXTRACTION    ? ? ? ?Current Outpatient Medications  ?Medication Sig Dispense Refill  ? acetaminophen (TYLENOL) 500 MG tablet Take 1,000 mg by mouth every 6 (six) hours as needed for moderate pain or headache.     ? albuterol (VENTOLIN HFA) 108 (90 Base) MCG/ACT inhaler INHALE 1 TO 2 PUFFS INTO THE LUNGS EVERY 4 HOURS AS NEEDED FOR WHEEZING OR SHORTNESS OF BREATH OR COUGH 6.7 g 0  ? ascorbic acid (VITAMIN C) 500 MG tablet Take 500 mg by mouth daily.    ? aspirin-acetaminophen-caffeine (EXCEDRIN MIGRAINE) 250-250-65 MG tablet Take 1 tablet by mouth every 8 (eight) hours as needed for headache or migraine.    ? azithromycin (ZITHROMAX) 250 MG tablet 2 tab po x 1 day then 1 tab po daily 6 tablet 0  ? calcium carbonate (TUMS - DOSED IN MG ELEMENTAL CALCIUM) 500 MG chewable tablet Chew 500 mg by mouth 3 (three) times daily as needed for indigestion or heartburn.     ? Carboxymethylcellul-Glycerin (LUBRICATING EYE DROPS OP) Place 1 drop into both eyes daily as needed (dry eyes).    ? cholecalciferol (VITAMIN D) 1000 UNITS tablet Take 1,000 Units by mouth every evening.    ?  diphenhydramine-acetaminophen (TYLENOL PM) 25-500 MG TABS tablet Take 1 tablet by mouth at bedtime as needed (sleep).     ? escitalopram (LEXAPRO) 20 MG tablet TAKE 1 TABLET BY MOUTH  DAILY 90 tablet 3  ? fluticasone (FLONASE) 50 MCG/ACT nasal spray Place 1 spray into both nostrils daily.    ? metoprolol succinate (TOPROL-XL) 25 MG 24 hr tablet TAKE ONE-HALF TABLET BY  MOUTH DAILY 45 tablet 3  ? neomycin-bacitracin-polymyxin (NEOSPORIN) ointment Apply 1 application topically as needed for wound care.    ? pantoprazole (PROTONIX) 40 MG tablet TAKE 1 TABLET BY MOUTH  DAILY 90 tablet 1  ? Probiotic Product (PROBIOTIC DAILY PO) Take 1 capsule by mouth every evening.     ?  vitamin B-12 (CYANOCOBALAMIN) 1000 MCG tablet Take 1,000 mcg by mouth every evening.     ? zinc gluconate 50 MG tablet Take 50 mg by mouth daily.    ? ?No current facility-administered medications for this visit.  ? ? ?Allergies:   Hydrocodone-guaifenesin, Sulfonamide derivatives, Trazodone and nefazodone, and Latex  ? ? ?Social History:  The patient  reports that she has never smoked. She has never used smokeless tobacco. She reports current alcohol use. She reports that she does not use drugs.  ? ?Family History:  The patient's family history includes Bone cancer in an other family member; Colon polyps in her father; Diabetes in an other family member; Heart disease in an other family member.  ? ? ?ROS:  Please see the history of present illness.   Otherwise, review of systems are positive for none.   All other systems are reviewed and negative.  ? ? ?PHYSICAL EXAM: ?VS:  BP 117/67   Pulse 69   Ht 5\' 7"  (1.702 m)   Wt 162 lb 6.4 oz (73.7 kg)   LMP 07/03/2018   SpO2 97%   BMI 25.44 kg/m?  , BMI Body mass index is 25.44 kg/m?. ?GEN: Well nourished, well developed, in no acute distress  ?HEENT: normal  ?Neck: no JVD, carotid bruits, or masses ?Cardiac: RRR; no murmurs, rubs, or gallops,no edema  ?Respiratory:  clear to auscultation bilaterally, normal work of breathing ?GI: soft, nontender, nondistended, + BS ?MS: no deformity or atrophy  ?Skin: warm and dry, no rash ?Neuro:  Strength and sensation are intact ?Psych: euthymic mood, full affect ? ? ?EKG:  EKG is ordered today. ?EKG showed normal sinus rhythm with no significant ST or T wave changes. ? ? ?Recent Labs: ?02/20/2021: Hemoglobin 14.1; Platelets 271.0  ? ? ?Lipid Panel ?   ?Component Value Date/Time  ? CHOL 200 06/10/2018 0837  ? TRIG 152.0 (H) 06/10/2018 6767  ? HDL 48.20 06/10/2018 0837  ? CHOLHDL 4 06/10/2018 0837  ? VLDL 30.4 06/10/2018 0837  ? LDLCALC 121 (H) 06/10/2018 2094  ? ?  ? ?Wt Readings from Last 3 Encounters:  ?01/06/22 162 lb 6.4 oz  (73.7 kg)  ?02/20/21 158 lb (71.7 kg)  ?12/17/20 151 lb 3.2 oz (68.6 kg)  ?  ? ? ? ?   ? View : No data to display.  ?  ?  ?  ? ? ? ? ?ASSESSMENT AND PLAN: ? ?1.  Palpitations due to PVCs and PACs: Overall well controlled with small dose of Toprol.  No evidence of PVCs by exam or EKG.  She has tried to come off small dose Toprol but had recurrent palpitations.  I think it is reasonable to continue on current dose of Toprol 12.5 mg once  daily. ? ?2.  Borderline mitral valve prolapse: Echocardiogram last year showed only trivial regurgitation. ? ?3.  Panic attacks and anxiety.  Symptoms improved significantly. ? ? ?Disposition:   Given cardiac stability, she can follow-up with me as needed.  I asked her to request refills of Toprol from her primary care physician.  She can always follow-up with me if she has worsening symptoms. ? ?Signed, ? ?Kathlyn Sacramento, MD  ?01/06/2022 10:47 AM    ?[place] ?

## 2022-01-06 NOTE — Patient Instructions (Signed)
Medication Instructions:  ?No Changes In Medications at this time.  ?*If you need a refill on your cardiac medications before your next appointment, please call your pharmacy* ? ?Follow-Up: ?At [facility], you and your health needs are our priority.  As part of our continuing mission to provide you with exceptional heart care, we have created designated Provider Care Teams.  These Care Teams include your primary Cardiologist (physician) and Advanced Practice Providers (APPs -  Physician Assistants and Nurse Practitioners) who all work together to provide you with the care you need, when you need it. ? ?Your next appointment:   ?AS NEEDED  ? ?The format for your next appointment:   ?In Person ? ?Provider: Dr. Fletcher Anon ? ? ? ? ? ? ?  ?

## 2022-02-04 ENCOUNTER — Telehealth: Payer: Self-pay | Admitting: Emergency Medicine

## 2022-02-04 DIAGNOSIS — R911 Solitary pulmonary nodule: Secondary | ICD-10-CM

## 2022-02-04 DIAGNOSIS — R0602 Shortness of breath: Secondary | ICD-10-CM

## 2022-02-04 NOTE — Telephone Encounter (Signed)
There is not a order for a ct to be scheduled

## 2022-02-06 NOTE — Telephone Encounter (Signed)
Patient was last seen in January 2022 and advised to follow up in March 2022 after her CT scan. She completed the CT chest w/o contrast in March 2022 but she never followed up.   RB, would you like for [place] to place an order for CT scan and get her scheduled for an OV?

## 2022-02-10 NOTE — Telephone Encounter (Signed)
Left message informing patient Chest CT order placed. F/U appt made for 7/13.  All information left on patient voicemail. Made aware we would contact her to schedule CT.   Nothing further needed at this time.

## 2022-02-10 NOTE — Telephone Encounter (Signed)
Yes please

## 2022-03-13 ENCOUNTER — Ambulatory Visit (INDEPENDENT_AMBULATORY_CARE_PROVIDER_SITE_OTHER)
Admission: RE | Admit: 2022-03-13 | Discharge: 2022-03-13 | Disposition: A | Discharge status: Home / Self Care | Payer: BC Managed Care – PPO | Source: Ambulatory Visit | Attending: Emergency Medicine | Admitting: Emergency Medicine

## 2022-03-13 DIAGNOSIS — R0602 Shortness of breath: Secondary | ICD-10-CM | POA: Diagnosis not present

## 2022-03-13 DIAGNOSIS — R911 Solitary pulmonary nodule: Secondary | ICD-10-CM | POA: Diagnosis not present

## 2022-03-26 ENCOUNTER — Ambulatory Visit: Payer: BC Managed Care – PPO | Admitting: Emergency Medicine

## 2022-03-26 ENCOUNTER — Encounter: Payer: Self-pay | Admitting: Emergency Medicine

## 2022-03-26 DIAGNOSIS — R0602 Shortness of breath: Secondary | ICD-10-CM | POA: Diagnosis not present

## 2022-03-26 DIAGNOSIS — R911 Solitary pulmonary nodule: Secondary | ICD-10-CM

## 2022-03-26 NOTE — Assessment & Plan Note (Signed)
Smaller on her serial imaging, most recently 03/13/2022.  The biopsy was consistent with giant cells, debris, possibly due to an aspiration event (versus her Sjogren's.).  She has some scattered small cysts of unclear significance, not usually associated with Sjogren's.  For now I do not believe we need to perform a dedicated repeat CT chest.  We will follow her clinically and decide whether this comes indicated going forward.

## 2022-03-26 NOTE — Patient Instructions (Signed)
We reviewed your CT scans of the chest today. Agree with going back to see rheumatology Please follow with Dr. Lamonte Sakai in 1 year or sooner if you notice any changes in your breathing.

## 2022-03-26 NOTE — Progress Notes (Signed)
   Subjective:    Patient ID: Jill Leonard, female    DOB: 1970/10/14, 51 y.o.   MRN: [id number]  HPI  ROV 09/24/20 --follow-up visit for 51 year old woman woman with a history of Sjogren's, GERD, migraines, a poorly formed right pulmonary nodule that was biopsied and found to have lymphoid nodularity, histiocytes and foreign body giant cells.  No evidence for a lymphoproliferative disorder, suspect related to an aspiration event versus related to her Sjogren's. She underwent pulmonary function testing today to evaluate intermittent shortness of breath that can sometimes happen with exertion, speaking. She has a cough at night, can wake her. She has some breakthrough reflux even on PPI. She has some drainage on flonase. Avoids loratadine due to her dryness. She can get winded with exerting, when bending over, more in the evening.   Pulmonary function testing performed today reviewed by me, show normal airflows without a bronchodilator response, normal lung volumes, normal diffusion capacity.  Her flow volume loop was normal in appearance.  Planning for repeat CT chest in March to follow her pulmonary nodule   ROV 03/26/2022 --Jill Leonard is 51, has a history of Sjogren syndrome, GERD, migraines.  She had a right sided pulmonary nodule biopsy that showed lymphoid nodularity and histiocytes with foreign body giant cells without any evidence of a lymphoproliferative disorder.  An aspiration event was suspected (versus her Sjogren's).  She has had repeat CTs done 11/28/2020 and now most recently 03/13/2022  CT chest 11/28/2020 reviewed by me, shows that her right middle lobe nodularity was stable 2.4 cm, presumed to be postinflammatory/postinfectious.  CT chest 03/13/2022 reviewed by me showed that the right middle lobe nodule is slightly smaller at 1.8 cm.   Review of Systems As per HPI      Objective:   Physical Exam  Vitals:   03/26/22 1010  BP: 122/70  Pulse: 66  Temp: 98 F (36.7 C)   TempSrc: Oral  SpO2: 97%  Weight: 160 lb 6.4 oz (72.8 kg)  Height: 5\' 7"  (1.702 m)   Gen: Pleasant, well-nourished, in no distress,  normal affect  ENT: No lesions,  mouth clear,  oropharynx dry but clear, no postnasal drip  Neck: No JVD, no stridor  Lungs: No use of accessory muscles, no crackles or wheezing on normal respiration, no wheeze on forced expiration  Cardiovascular: RRR, heart sounds normal, no murmur or gallops, no peripheral edema  Musculoskeletal: No deformities, no cyanosis or clubbing  Neuro: alert, awake, non focal  Skin: Warm, no lesions or rash      Assessment & Plan:  Solitary pulmonary nodule Smaller on her serial imaging, most recently 03/13/2022.  The biopsy was consistent with giant cells, debris, possibly due to an aspiration event (versus her Sjogren's.).  She has some scattered small cysts of unclear significance, not usually associated with Sjogren's.  For now I do not believe we need to perform a dedicated repeat CT chest.  We will follow her clinically and decide whether this comes indicated going forward.  Dyspnea Some intermittent dyspnea with normal airflows on pulmonary function testing.  She does have a slightly elevated right hemidiaphragm, question whether restriction may be a contributor.  If her functional capacity changes, dyspnea becomes more intrusive then we will repeat her PFT  Time spent 31 minutes  Baltazar Apo, MD, PhD 03/26/2022, 10:35 AM [facility] [phone] or if no answer [phone]

## 2022-03-26 NOTE — Assessment & Plan Note (Signed)
Some intermittent dyspnea with normal airflows on pulmonary function testing.  She does have a slightly elevated right hemidiaphragm, question whether restriction may be a contributor.  If her functional capacity changes, dyspnea becomes more intrusive then we will repeat her PFT

## 2022-06-01 ENCOUNTER — Telehealth: Payer: Self-pay | Admitting: Family Medicine

## 2022-06-01 DIAGNOSIS — Z1159 Encounter for screening for other viral diseases: Secondary | ICD-10-CM

## 2022-06-01 DIAGNOSIS — Z13220 Encounter for screening for lipoid disorders: Secondary | ICD-10-CM

## 2022-06-01 NOTE — Telephone Encounter (Signed)
-----   Message from Ellamae Sia sent at 05/27/2022 12:06 PM EDT ----- Regarding: Lab orders for Wednesday, 9.27.23 Patient is scheduled for CPX labs, please order future labs, Thanks , Karna Christmas

## 2022-06-09 ENCOUNTER — Other Ambulatory Visit: Payer: Self-pay | Admitting: Cardiovascular Disease

## 2022-06-09 DIAGNOSIS — R002 Palpitations: Secondary | ICD-10-CM

## 2022-06-10 ENCOUNTER — Other Ambulatory Visit (INDEPENDENT_AMBULATORY_CARE_PROVIDER_SITE_OTHER): Payer: BC Managed Care – PPO

## 2022-06-10 DIAGNOSIS — Z1159 Encounter for screening for other viral diseases: Secondary | ICD-10-CM | POA: Diagnosis not present

## 2022-06-10 DIAGNOSIS — Z13220 Encounter for screening for lipoid disorders: Secondary | ICD-10-CM | POA: Diagnosis not present

## 2022-06-10 LAB — COMPREHENSIVE METABOLIC PANEL
ALT: 21 U/L (ref 0–35)
AST: 27 U/L (ref 0–37)
Albumin: 4.4 g/dL (ref 3.5–5.2)
Alkaline Phosphatase: 61 U/L (ref 39–117)
BUN: 13 mg/dL (ref 6–23)
CO2: 30 mEq/L (ref 19–32)
Calcium: 9.5 mg/dL (ref 8.4–10.5)
Chloride: 103 mEq/L (ref 96–112)
Creatinine, Ser: 0.86 mg/dL (ref 0.40–1.20)
GFR: 78.46 mL/min (ref >60.00)
Glucose, Bld: 78 mg/dL (ref 70–99)
Potassium: 3.8 mEq/L (ref 3.5–5.1)
Sodium: 139 mEq/L (ref 135–145)
Total Bilirubin: 0.4 mg/dL (ref 0.2–1.2)
Total Protein: 8.2 g/dL (ref 6.0–8.3)

## 2022-06-10 LAB — LIPID PANEL
Cholesterol: 219 mg/dL — ABNORMAL HIGH (ref 0–200)
HDL: 57 mg/dL (ref >39.00)
LDL Cholesterol: 146 mg/dL — ABNORMAL HIGH (ref 0–99)
NonHDL: 161.7
Total CHOL/HDL Ratio: 4
Triglycerides: 79 mg/dL (ref 0.0–149.0)
VLDL: 15.8 mg/dL (ref 0.0–40.0)

## 2022-06-10 NOTE — Telephone Encounter (Signed)
Refill request

## 2022-06-11 LAB — HEPATITIS C ANTIBODY: Hepatitis C Ab: NONREACTIVE

## 2022-06-11 NOTE — Progress Notes (Signed)
No critical labs need to be addressed urgently. We will discuss labs in detail at upcoming office visit.   

## 2022-06-17 ENCOUNTER — Ambulatory Visit (INDEPENDENT_AMBULATORY_CARE_PROVIDER_SITE_OTHER): Payer: BC Managed Care – PPO | Admitting: Family Medicine

## 2022-06-17 ENCOUNTER — Encounter: Payer: Self-pay | Admitting: Family Medicine

## 2022-06-17 VITALS — BP 122/80 | HR 65 | Temp 97.2°F | Ht 67.0 in | Wt 163.0 lb

## 2022-06-17 DIAGNOSIS — E042 Nontoxic multinodular goiter: Secondary | ICD-10-CM

## 2022-06-17 DIAGNOSIS — F331 Major depressive disorder, recurrent, moderate: Secondary | ICD-10-CM

## 2022-06-17 DIAGNOSIS — K219 Gastro-esophageal reflux disease without esophagitis: Secondary | ICD-10-CM

## 2022-06-17 DIAGNOSIS — Z0000 Encounter for general adult medical examination without abnormal findings: Secondary | ICD-10-CM | POA: Diagnosis not present

## 2022-06-17 DIAGNOSIS — R911 Solitary pulmonary nodule: Secondary | ICD-10-CM

## 2022-06-17 DIAGNOSIS — I471 Supraventricular tachycardia, unspecified: Secondary | ICD-10-CM | POA: Diagnosis not present

## 2022-06-17 DIAGNOSIS — M3500 Sicca syndrome, unspecified: Secondary | ICD-10-CM

## 2022-06-17 DIAGNOSIS — Z1211 Encounter for screening for malignant neoplasm of colon: Secondary | ICD-10-CM

## 2022-06-17 DIAGNOSIS — E7800 Pure hypercholesterolemia, unspecified: Secondary | ICD-10-CM

## 2022-06-17 DIAGNOSIS — E559 Vitamin D deficiency, unspecified: Secondary | ICD-10-CM

## 2022-06-17 MED ORDER — PANTOPRAZOLE SODIUM 40 MG PO TBEC: 40.0000 mg | DELAYED_RELEASE_TABLET | Freq: Every day | ORAL | 1 refills | Status: DC

## 2022-06-17 NOTE — Assessment & Plan Note (Signed)
Followed by specialist with yearly imaging.

## 2022-06-17 NOTE — Patient Instructions (Addendum)
Add Pepcid AC  at bedtime.  Continue pantoprazole 40 mg daily.  If  reflux not improving.. discuss with GI in future.  Sent referral to GI for colonoscopy.  Consider flu, tetanus, shingles and COVID vaccines

## 2022-06-17 NOTE — Assessment & Plan Note (Signed)
She has not seen rheumatologist in past 5 year. Not really interested in immunosuppressant med again as caused SE.

## 2022-06-17 NOTE — Progress Notes (Signed)
Patient ID: Jill Leonard, female    DOB: Aug 25, 1971, 51 y.o.   MRN: [id number]  This visit was conducted in person.  BP 122/80 (BP Location: Left Arm, Patient Position: Sitting, Cuff Size: Normal)   Pulse 65   Temp (!) 97.2 F (36.2 C) (Temporal)   Ht 5\' 7"  (1.702 m)   Wt 163 lb (73.9 kg)   LMP 07/03/2018   SpO2 96%   BMI 25.53 kg/m    CC:  Chief Complaint  Patient presents with   Annual Exam    Subjective:   HPI: Jill Leonard is a 51 y.o. female presenting on 06/17/2022 for Annual Exam  History of Sjogren's disease, fibromyalgia MDD  and generalized anxiety disorder  Well-controlled  depression on Lexapro 20 mg p.o. daily, still with anxiety and menopausal symptoms. [place] [facility] Visit from 06/17/2022 in [place] [facility]  PHQ-2 Total Score 0        Exercise: trying to get 10,000 steps a day, hand weights.  Diet: moderate  Wt Readings from Last 3 Encounters:  06/17/22 163 lb (73.9 kg)  03/26/22 160 lb 6.4 oz (72.8 kg)  01/06/22 162 lb 6.4 oz (73.7 kg)  Body mass index is 25.53 kg/m.    Has history of SVT.Marland Kitchen Using metoprolol mg  1/2 tab daily  Dr. Fletcher Anon.  Elevated Cholesterol:  Lab Results  Component Value Date   CHOL 219 (H) 06/10/2022   HDL 57.00 06/10/2022   LDLCALC 146 (H) 06/10/2022   TRIG 79.0 06/10/2022   CHOLHDL 4 06/10/2022  The 10-year ASCVD risk score (Arnett DK, et al., 2019) is: 1.3%   Values used to calculate the score:     Age: 40 years     Sex: Female     Is Non-Hispanic African American: No     Diabetic: No     Tobacco smoker: No     Systolic Blood Pressure: 737 mmHg     Is BP treated: No     HDL Cholesterol: 57 mg/dL     Total Cholesterol: 219 mg/dL   GERD: moderate control on pantoprazole 40 mg daily.  Relevant past medical, surgical, family and social history reviewed and updated as indicated. Interim medical history since our last visit reviewed. Allergies and medications reviewed and  updated. Outpatient Medications Prior to Visit  Medication Sig Dispense Refill   acetaminophen (TYLENOL) 500 MG tablet Take 1,000 mg by mouth every 6 (six) hours as needed for moderate pain or headache.      albuterol (VENTOLIN HFA) 108 (90 Base) MCG/ACT inhaler INHALE 1 TO 2 PUFFS INTO THE LUNGS EVERY 4 HOURS AS NEEDED FOR WHEEZING OR SHORTNESS OF BREATH OR COUGH 6.7 g 0   aspirin-acetaminophen-caffeine (EXCEDRIN MIGRAINE) 250-250-65 MG tablet Take 1 tablet by mouth every 8 (eight) hours as needed for headache or migraine.     calcium carbonate (TUMS - DOSED IN MG ELEMENTAL CALCIUM) 500 MG chewable tablet Chew 500 mg by mouth 3 (three) times daily as needed for indigestion or heartburn.      Carboxymethylcellul-Glycerin (LUBRICATING EYE DROPS OP) Place 1 drop into both eyes daily as needed (dry eyes).     cholecalciferol (VITAMIN D) 1000 UNITS tablet Take 1,000 Units by mouth every evening.     diphenhydramine-acetaminophen (TYLENOL PM) 25-500 MG TABS tablet Take 1 tablet by mouth at bedtime as needed (sleep).      escitalopram (LEXAPRO) 20 MG tablet TAKE 1 TABLET BY MOUTH  DAILY 90  tablet 3   fluticasone (FLONASE) 50 MCG/ACT nasal spray Place 1 spray into both nostrils daily.     metoprolol succinate (TOPROL-XL) 25 MG 24 hr tablet TAKE ONE-HALF TABLET BY MOUTH  DAILY 45 tablet 2   neomycin-bacitracin-polymyxin (NEOSPORIN) ointment Apply 1 application topically as needed for wound care.     pantoprazole (PROTONIX) 40 MG tablet TAKE 1 TABLET BY MOUTH  DAILY 90 tablet 1   Probiotic Product (PROBIOTIC DAILY PO) Take 1 capsule by mouth every evening.      vitamin B-12 (CYANOCOBALAMIN) 1000 MCG tablet Take 1,000 mcg by mouth every evening.     zinc gluconate 50 MG tablet Take 50 mg by mouth daily.     ascorbic acid (VITAMIN C) 500 MG tablet Take 500 mg by mouth daily.     azithromycin (ZITHROMAX) 250 MG tablet 2 tab po x 1 day then 1 tab po daily 6 tablet 0   No facility-administered medications  prior to visit.     Per HPI unless specifically indicated in ROS section below Review of Systems  Constitutional:  Negative for fatigue and fever.  HENT:  Negative for congestion.   Eyes:  Negative for pain.  Respiratory:  Negative for cough and shortness of breath.   Cardiovascular:  Negative for chest pain, palpitations and leg swelling.  Gastrointestinal:  Negative for abdominal pain.  Genitourinary:  Negative for dysuria and vaginal bleeding.  Musculoskeletal:  Negative for back pain.  Neurological:  Negative for syncope, light-headedness and headaches.  Psychiatric/Behavioral:  Negative for dysphoric mood.    Objective:  BP 122/80 (BP Location: Left Arm, Patient Position: Sitting, Cuff Size: Normal)   Pulse 65   Temp (!) 97.2 F (36.2 C) (Temporal)   Ht 5\' 7"  (1.702 m)   Wt 163 lb (73.9 kg)   LMP 07/03/2018   SpO2 96%   BMI 25.53 kg/m   Wt Readings from Last 3 Encounters:  06/17/22 163 lb (73.9 kg)  03/26/22 160 lb 6.4 oz (72.8 kg)  01/06/22 162 lb 6.4 oz (73.7 kg)      Physical Exam Vitals and nursing note reviewed.  Constitutional:      General: She is not in acute distress.    Appearance: Normal appearance. She is well-developed. She is not ill-appearing or toxic-appearing.  HENT:     Head: Normocephalic.     Right Ear: Hearing, tympanic membrane, ear canal and external ear normal.     Left Ear: Hearing, tympanic membrane, ear canal and external ear normal.     Nose: Nose normal.  Eyes:     General: Lids are normal. Lids are everted, no foreign bodies appreciated.     Conjunctiva/sclera: Conjunctivae normal.     Pupils: Pupils are equal, round, and reactive to light.  Neck:     Thyroid: No thyroid mass or thyromegaly.     Vascular: No carotid bruit.     Trachea: Trachea normal.  Cardiovascular:     Rate and Rhythm: Normal rate and regular rhythm.     Heart sounds: Normal heart sounds, S1 normal and S2 normal. No murmur heard.    No gallop.  Pulmonary:      Effort: Pulmonary effort is normal. No respiratory distress.     Breath sounds: Normal breath sounds. No wheezing, rhonchi or rales.  Abdominal:     General: Bowel sounds are normal. There is no distension or abdominal bruit.     Palpations: Abdomen is soft. There is no fluid wave or  mass.     Tenderness: There is no abdominal tenderness. There is no guarding or rebound.     Hernia: No hernia is present.  Musculoskeletal:     Cervical back: Normal range of motion and neck supple.  Lymphadenopathy:     Cervical: No cervical adenopathy.  Skin:    General: Skin is warm and dry.     Findings: No rash.  Neurological:     Mental Status: She is alert.     Cranial Nerves: No cranial nerve deficit.     Sensory: No sensory deficit.  Psychiatric:        Mood and Affect: Mood is not anxious or depressed.        Speech: Speech normal.        Behavior: Behavior normal. Behavior is cooperative.        Judgment: Judgment normal.       Results for orders placed or performed in visit on 06/10/22  Hepatitis C antibody  Result Value Ref Range   Hepatitis C Ab NON-REACTIVE NON-REACTIVE  Lipid panel  Result Value Ref Range   Cholesterol 219 (H) 0 - 200 mg/dL   Triglycerides 79.0 0.0 - 149.0 mg/dL   HDL 57.00 >39.00 mg/dL   VLDL 15.8 0.0 - 40.0 mg/dL   LDL Cholesterol 146 (H) 0 - 99 mg/dL   Total CHOL/HDL Ratio 4    NonHDL 161.70   Comprehensive metabolic panel  Result Value Ref Range   Sodium 139 135 - 145 mEq/L   Potassium 3.8 3.5 - 5.1 mEq/L   Chloride 103 96 - 112 mEq/L   CO2 30 19 - 32 mEq/L   Glucose, Bld 78 70 - 99 mg/dL   BUN 13 6 - 23 mg/dL   Creatinine, Ser 0.86 0.40 - 1.20 mg/dL   Total Bilirubin 0.4 0.2 - 1.2 mg/dL   Alkaline Phosphatase 61 39 - 117 U/L   AST 27 0 - 37 U/L   ALT 21 0 - 35 U/L   Total Protein 8.2 6.0 - 8.3 g/dL   Albumin 4.4 3.5 - 5.2 g/dL   GFR 78.46 >60.00 mL/min   Calcium 9.5 8.4 - 10.5 mg/dL     COVID 19 screen:  No recent travel or known exposure  to COVID19 The patient denies respiratory symptoms of COVID 19 at this time. The importance of social distancing was discussed today.   Assessment and Plan   The patient's preventative maintenance and recommended screening tests for an annual wellness exam were reviewed in full today. Brought up to date unless services declined.  Counselled on the importance of diet, exercise, and its role in overall health and mortality. The patient's FH and SH was reviewed, including their home life, tobacco status, and drug and alcohol status.   Vaccines: due for covid vaccine, Tetanus, shingrix and flu vaccine.. not interested in Pap/DVE:  Dr. Cletis Media GYN... last 02/2021, s/p hysterectomy for fibroids. Mammo:  06/2021 at [place].Marland Kitchen scheduled Bone Density: Colon: due  Smoking Status: none ETOH/ drug use: rare/none  Hep C:  done  HIV screen:   refused  Problem List Items Addressed This Visit     GERD    Chronic, moderate control Continue pantoprazole 40 mg p.o. daily.  Prescription given.  She will add Pepcid AC prior to bedtime for continued bedtime reflux.      Relevant Medications   pantoprazole (PROTONIX) 40 MG tablet   Hypercholesterolemia     Chronic.  Working on low cholesterol diet. The  10-year ASCVD risk score (Arnett DK, et al., 2019) is: 1.3%   Values used to calculate the score:     Age: 54 years     Sex: Female     Is Non-Hispanic African American: No     Diabetic: No     Tobacco smoker: No     Systolic Blood Pressure: 841 mmHg     Is BP treated: No     HDL Cholesterol: 57 mg/dL     Total Cholesterol: 219 mg/dL       MDD (major depressive disorder), recurrent episode (HCC)    Stable, chronic.  Continue current medication.   Lexapro 20 mg p.o. daily      Multiple thyroid nodules     Followed by specialist with yearly imaging.      Sjogren's disease ([place])     She has not seen rheumatologist in past 5 year. Not really interested in immunosuppressant med again as  caused SE.      Solitary pulmonary nodule    Followed by Dr. Lamonte Sakai.      SVT (supraventricular tachycardia)     Chronic, controlled with metoprolol low dose daily.,      Vitamin D deficiency   Other Visit Diagnoses     Routine general medical examination at a health care facility    -  Primary   Colon cancer screening       Relevant Orders   Ambulatory referral to Gastroenterology       Eliezer Lofts, MD

## 2022-06-17 NOTE — Assessment & Plan Note (Signed)
Chronic, moderate control Continue pantoprazole 40 mg p.o. daily.  Prescription given.  She will add Pepcid AC prior to bedtime for continued bedtime reflux.

## 2022-06-17 NOTE — Assessment & Plan Note (Signed)
Chronic.  Working on low cholesterol diet. The 10-year ASCVD risk score (Arnett DK, et al., 2019) is: 1.3%   Values used to calculate the score:     Age: 52 years     Sex: Female     Is Non-Hispanic African American: No     Diabetic: No     Tobacco smoker: No     Systolic Blood Pressure: 164 mmHg     Is BP treated: No     HDL Cholesterol: 57 mg/dL     Total Cholesterol: 219 mg/dL

## 2022-06-17 NOTE — Assessment & Plan Note (Signed)
Followed by Dr. Byrum.  

## 2022-06-17 NOTE — Assessment & Plan Note (Signed)
Chronic, controlled with metoprolol low dose daily.,

## 2022-06-17 NOTE — Assessment & Plan Note (Signed)
Stable, chronic.  Continue current medication.   Lexapro 20 mg p.o. daily

## 2022-06-26 ENCOUNTER — Encounter: Payer: Self-pay | Admitting: Cardiovascular Disease

## 2022-06-26 DIAGNOSIS — R002 Palpitations: Secondary | ICD-10-CM

## 2022-07-03 ENCOUNTER — Telehealth: Payer: Self-pay | Admitting: Cardiovascular Disease

## 2022-07-03 ENCOUNTER — Encounter: Payer: Self-pay | Admitting: *Deleted

## 2022-07-03 ENCOUNTER — Ambulatory Visit: Payer: BC Managed Care – PPO | Attending: Cardiovascular Disease

## 2022-07-03 DIAGNOSIS — R002 Palpitations: Secondary | ICD-10-CM

## 2022-07-03 NOTE — Telephone Encounter (Signed)
-----   Message from Ricci Barker, RN sent at 07/03/2022 12:54 PM EDT ----- Patient needs a one month with Dr. Fletcher Anon or APP in [place], [place] you

## 2022-07-03 NOTE — Telephone Encounter (Signed)
Called to schedule appt Call dropped

## 2022-07-07 DIAGNOSIS — R002 Palpitations: Secondary | ICD-10-CM

## 2022-07-21 ENCOUNTER — Encounter: Payer: Self-pay | Admitting: Family Medicine

## 2022-07-21 ENCOUNTER — Other Ambulatory Visit: Payer: Self-pay | Admitting: Family Medicine

## 2022-07-21 DIAGNOSIS — E042 Nontoxic multinodular goiter: Secondary | ICD-10-CM

## 2022-07-21 DIAGNOSIS — M3500 Sicca syndrome, unspecified: Secondary | ICD-10-CM

## 2022-07-21 DIAGNOSIS — Z1321 Encounter for screening for nutritional disorder: Secondary | ICD-10-CM

## 2022-07-21 DIAGNOSIS — E559 Vitamin D deficiency, unspecified: Secondary | ICD-10-CM

## 2022-07-24 ENCOUNTER — Other Ambulatory Visit (INDEPENDENT_AMBULATORY_CARE_PROVIDER_SITE_OTHER): Payer: BC Managed Care – PPO

## 2022-07-24 DIAGNOSIS — Z1321 Encounter for screening for nutritional disorder: Secondary | ICD-10-CM

## 2022-07-24 DIAGNOSIS — E042 Nontoxic multinodular goiter: Secondary | ICD-10-CM

## 2022-07-24 DIAGNOSIS — E559 Vitamin D deficiency, unspecified: Secondary | ICD-10-CM | POA: Diagnosis not present

## 2022-07-24 LAB — TSH: TSH: 0.92 u[IU]/mL (ref 0.35–5.50)

## 2022-07-24 LAB — VITAMIN B12: Vitamin B-12: 1445 pg/mL — ABNORMAL HIGH (ref 211–911)

## 2022-07-24 LAB — VITAMIN D 25 HYDROXY (VIT D DEFICIENCY, FRACTURES): VITD: 42.75 ng/mL (ref 30.00–100.00)

## 2022-07-24 LAB — T3, FREE: T3, Free: 3.4 pg/mL (ref 2.3–4.2)

## 2022-07-24 LAB — T4, FREE: Free T4: 0.91 ng/dL (ref 0.60–1.60)

## 2022-08-05 DIAGNOSIS — Z1231 Encounter for screening mammogram for malignant neoplasm of breast: Secondary | ICD-10-CM | POA: Diagnosis not present

## 2022-08-05 LAB — HM MAMMOGRAPHY

## 2022-08-11 ENCOUNTER — Ambulatory Visit: Payer: BC Managed Care – PPO | Attending: Cardiovascular Disease | Admitting: Cardiovascular Disease

## 2022-08-11 ENCOUNTER — Encounter: Payer: Self-pay | Admitting: Cardiovascular Disease

## 2022-08-11 ENCOUNTER — Encounter: Payer: Self-pay | Admitting: Family Medicine

## 2022-08-11 VITALS — BP 120/72 | HR 76 | Ht 67.0 in | Wt 156.0 lb

## 2022-08-11 DIAGNOSIS — I059 Rheumatic mitral valve disease, unspecified: Secondary | ICD-10-CM | POA: Diagnosis not present

## 2022-08-11 DIAGNOSIS — R002 Palpitations: Secondary | ICD-10-CM

## 2022-08-11 DIAGNOSIS — I480 Paroxysmal atrial fibrillation: Secondary | ICD-10-CM

## 2022-08-11 MED ORDER — METOPROLOL SUCCINATE ER 25 MG PO TB24: 25.0000 mg | ORAL_TABLET | Freq: Every day | ORAL | 3 refills | Status: DC

## 2022-08-11 NOTE — Progress Notes (Signed)
Cardiology Office Note   Date:  08/11/2022   ID:  Jill Leonard, DOB June 23, 1971, MRN [id number]  PCP:  Jill Sanders, MD  Cardiologist:   Jill Sacramento, MD   Chief Complaint  Patient presents with   Follow-up      History of Present Illness: Jill Leonard is a 51 y.o. female who presents for a follow-up visit regarding palpitations and tachycardia. She has known history of Sjogren's syndrome.  Echocardiogram in January 2016 showed normal LV systolic function with an ejection fraction of 60% with borderline prolapse of anterior mitral valve leaflet with only mild mitral regurgitation and no evidence of pulmonary hypertension. A Holter monitor showed normal sinus rhythm with occasional PVCs and PACs. She had a total of 445 PVCs and 1200 PACs. A treadmill stress test in June of 2016 was normal. she was started on small dose Toprol with improvement in palpitations. She also cut down on caffeine intake.  Most recent echocardiogram in May 2022 showed normal LV systolic function with trivial mitral regurgitation.  She had recent clear reported increased frequency of palpitations.  A 2 weeks ZIO monitor was done which showed sinus rhythm with an average heart rate of 68 bpm.  She was noted to have 1 short run of SVT.  In addition, she was noted to have atrial fibrillation with 1% burden with heart rate ranging from 59 to 160 bpm.  Most of the A-fib episodes were symptomatic.  No chest pain or shortness of breath.  Past Medical History:  Diagnosis Date   Anal fissure    Arthralgia of temporomandibular joint    MIGRAINES AND REGULAR   Calculus of gallbladder without mention of cholecystitis or obstruction    Complication of anesthesia    Depression    Dysrhythmia    RAPID HEARTBEAT AND SKIPPED BEATS ON METOPROLOL   Family history of adverse reaction to anesthesia    FAMILY MEMBERS HAVE PONV   Fibromyalgia    GERD (gastroesophageal reflux disease)    Heart murmur    IN  PAST, TOLD IS GONE NOW   History of kidney stones    History of nephrolithiasis    HLD (hyperlipidemia)    PONV (postoperative nausea and vomiting)    Sjogren - Larsson's syndrome    Sjogren's syndrome ([place])     Past Surgical History:  Procedure Laterality Date   ABDOMINAL HYSTERECTOMY     CHOLECYSTECTOMY  08/20/08   MANDIBLE SURGERY     VENTRAL HERNIA REPAIR N/A 06/12/2019   Procedure: VENTRAL HERNIA REPAIR WITH MESH;  Surgeon: Autumn Messing III, MD;  Location: [facility] OR;  Service: General;  Laterality: N/A;   WISDOM TOOTH EXTRACTION       Current Outpatient Medications  Medication Sig Dispense Refill   acetaminophen (TYLENOL) 500 MG tablet Take 1,000 mg by mouth every 6 (six) hours as needed for moderate pain or headache.      aspirin-acetaminophen-caffeine (EXCEDRIN MIGRAINE) 250-250-65 MG tablet Take 1 tablet by mouth every 8 (eight) hours as needed for headache or migraine.     calcium carbonate (TUMS - DOSED IN MG ELEMENTAL CALCIUM) 500 MG chewable tablet Chew 500 mg by mouth 3 (three) times daily as needed for indigestion or heartburn.      Carboxymethylcellul-Glycerin (LUBRICATING EYE DROPS OP) Place 1 drop into both eyes daily as needed (dry eyes).     cholecalciferol (VITAMIN D) 1000 UNITS tablet Take 1,000 Units by mouth every evening.     diphenhydramine-acetaminophen (  TYLENOL PM) 25-500 MG TABS tablet Take 1 tablet by mouth at bedtime as needed (sleep).      escitalopram (LEXAPRO) 20 MG tablet TAKE 1 TABLET BY MOUTH  DAILY (Patient taking differently: Take 10 mg by mouth daily.) 90 tablet 3   fluticasone (FLONASE) 50 MCG/ACT nasal spray Place 1 spray into both nostrils daily.     metoprolol succinate (TOPROL-XL) 25 MG 24 hr tablet TAKE ONE-HALF TABLET BY MOUTH  DAILY 45 tablet 2   pantoprazole (PROTONIX) 40 MG tablet Take 1 tablet (40 mg total) by mouth daily. 90 tablet 1   Probiotic Product (PROBIOTIC DAILY PO) Take 1 capsule by mouth every evening.  (Patient not taking: Reported  on 08/11/2022)     vitamin B-12 (CYANOCOBALAMIN) 1000 MCG tablet Take 1,000 mcg by mouth every evening. (Patient not taking: Reported on 08/11/2022)     zinc gluconate 50 MG tablet Take 50 mg by mouth daily. (Patient not taking: Reported on 08/11/2022)     No current facility-administered medications for this visit.    Allergies:   Hydrocodone-guaifenesin, Sulfonamide derivatives, Trazodone and nefazodone, and Latex    Social History:  The patient  reports that she has never smoked. She has never used smokeless tobacco. She reports current alcohol use. She reports that she does not use drugs.   Family History:  The patient's family history includes Bone cancer in an other family member; Colon polyps in her father; Diabetes in an other family member; Heart disease in an other family member.    ROS:  Please see the history of present illness.   Otherwise, review of systems are positive for none.   All other systems are reviewed and negative.    PHYSICAL EXAM: VS:  BP 120/72   Pulse 76   Ht 5\' 7"  (1.702 m)   Wt 156 lb (70.8 kg)   LMP 07/03/2018   BMI 24.43 kg/m  , BMI Body mass index is 24.43 kg/m. GEN: Well nourished, well developed, in no acute distress  HEENT: normal  Neck: no JVD, carotid bruits, or masses Cardiac: RRR; no murmurs, rubs, or gallops,no edema  Respiratory:  clear to auscultation bilaterally, normal work of breathing GI: soft, nontender, nondistended, + BS MS: no deformity or atrophy  Skin: warm and dry, no rash Neuro:  Strength and sensation are intact Psych: euthymic mood, full affect   EKG:  EKG is ordered today. EKG showed normal sinus rhythm with nonspecific T wave changes.   Recent Labs: 06/10/2022: ALT 21; BUN 13; Creatinine, Ser 0.86; Potassium 3.8; Sodium 139 07/24/2022: TSH 0.92    Lipid Panel    Component Value Date/Time   CHOL 219 (H) 06/10/2022 0937   TRIG 79.0 06/10/2022 0937   HDL 57.00 06/10/2022 0937   CHOLHDL 4 06/10/2022 0937    VLDL 15.8 06/10/2022 0937   LDLCALC 146 (H) 06/10/2022 0937      Wt Readings from Last 3 Encounters:  08/11/22 156 lb (70.8 kg)  06/17/22 163 lb (73.9 kg)  03/26/22 160 lb 6.4 oz (72.8 kg)           No data to display            ASSESSMENT AND PLAN:  1.  Paroxysmal atrial fibrillation: Recent monitor confirmed episodes of atrial fibrillation which were symptomatic.  CHA2DS2-VASc score is 1 due to female gender.  Thus, I do not think anticoagulation additionally is needed at this time.  Recommend increasing Toprol to 25 mg once daily.  2.  Borderline mitral valve prolapse: Echocardiogram last year showed only trivial regurgitation.  3.  Panic attacks and anxiety.  Symptoms improved significantly.   Disposition:   Follow-up in 6 months.  Signed,  Jill Sacramento, MD  08/11/2022 11:04 AM    [place]

## 2022-08-11 NOTE — Patient Instructions (Signed)
Medication Instructions:  INCREASE the Metoprolol Succinate to 25 mg once daily  *If you need a refill on your cardiac medications before your next appointment, please call your pharmacy*   Lab Work: None ordered If you have labs (blood work) drawn today and your tests are completely normal, you will receive your results only by: [place] (if you have MyChart) OR A paper copy in the mail If you have any lab test that is abnormal or we need to change your treatment, we will call you to review the results.   Testing/Procedures: None ordered   Follow-Up: At [facility], you and your health needs are our priority.  As part of our continuing mission to provide you with exceptional heart care, we have created designated Provider Care Teams.  These Care Teams include your primary Cardiologist (physician) and Advanced Practice Providers (APPs -  Physician Assistants and Nurse Practitioners) who all work together to provide you with the care you need, when you need it.  We recommend signing up for the patient portal called "MyChart".  Sign up information is provided on this After Visit Summary.  MyChart is used to connect with patients for Virtual Visits (Telemedicine).  Patients are able to view lab/test results, encounter notes, upcoming appointments, etc.  Non-urgent messages can be sent to your provider as well.   To learn more about what you can do with MyChart, go to [email or web address].    Your next appointment:   6 month(s)  The format for your next appointment:   In Person  Provider:   Dr. Fletcher Anon

## 2022-08-11 NOTE — Addendum Note (Signed)
Addended by: Jacqulynn Cadet on: 08/11/2022 12:32 PM   Modules accepted: Orders

## 2022-08-14 ENCOUNTER — Ambulatory Visit: Payer: BC Managed Care – PPO | Admitting: Cardiology

## 2022-10-24 ENCOUNTER — Other Ambulatory Visit: Payer: Self-pay | Admitting: Family Medicine

## 2022-12-07 ENCOUNTER — Encounter: Payer: Self-pay | Admitting: Family Medicine

## 2022-12-07 DIAGNOSIS — L814 Other melanin hyperpigmentation: Secondary | ICD-10-CM | POA: Diagnosis not present

## 2022-12-07 DIAGNOSIS — L821 Other seborrheic keratosis: Secondary | ICD-10-CM | POA: Diagnosis not present

## 2022-12-07 DIAGNOSIS — D225 Melanocytic nevi of trunk: Secondary | ICD-10-CM | POA: Diagnosis not present

## 2022-12-08 ENCOUNTER — Other Ambulatory Visit: Payer: Self-pay | Admitting: Family Medicine

## 2022-12-08 DIAGNOSIS — Z1211 Encounter for screening for malignant neoplasm of colon: Secondary | ICD-10-CM

## 2022-12-18 ENCOUNTER — Other Ambulatory Visit: Payer: Self-pay | Admitting: Surgery

## 2022-12-18 DIAGNOSIS — E042 Nontoxic multinodular goiter: Secondary | ICD-10-CM

## 2022-12-23 DIAGNOSIS — Z1211 Encounter for screening for malignant neoplasm of colon: Secondary | ICD-10-CM | POA: Diagnosis not present

## 2022-12-28 DIAGNOSIS — E042 Nontoxic multinodular goiter: Secondary | ICD-10-CM | POA: Diagnosis not present

## 2022-12-29 ENCOUNTER — Ambulatory Visit
Admission: RE | Admit: 2022-12-29 | Discharge: 2022-12-29 | Disposition: A | Discharge status: Home / Self Care | Payer: BC Managed Care – PPO | Source: Ambulatory Visit | Attending: Surgery | Admitting: Surgery

## 2022-12-29 DIAGNOSIS — E042 Nontoxic multinodular goiter: Secondary | ICD-10-CM | POA: Diagnosis not present

## 2022-12-30 LAB — COLOGUARD: COLOGUARD: NEGATIVE

## 2023-01-19 NOTE — Progress Notes (Signed)
Office Visit Note  Patient: Jill Leonard             Date of Birth: 08/27/1971           MRN: [id number]             PCP: Excell Seltzer, MD Referring: Excell Seltzer, MD Visit Date: 02/02/2023 Occupation: @GUAROCC @  Subjective:  Pain in multiple joints, fatigue, sicca symptoms  History of Present Illness: Jill Leonard is a 52 y.o. female seen in consultation per request of her PCP.  According to the patient her symptoms started about 20 years ago with generalized pain and achiness.  She was also experiencing dry mouth and dry eyes.  She was evaluated by Dr. Oleta Mouse who diagnosed her with Sjogren's and fibromyalgia syndrome.  She was on Plaquenil for about 4 years which she eventually was discontinued as she did not have any benefit  She also took meloxicam for the same time without much benefit.  She saw Dr. Dierdre Forth for few years and then Dr. Kathi Ludwig.  She said Dr. Kathi Ludwig gave her some narcotics for insomnia which were discontinued by her PCP.  She continues to have generalized pain, joint pain and dry mouth and dry eyes.  She describes pain and discomfort in her neck, shoulders elbows and hands, hips knees and ankles.  She has not noticed any joint swelling.  She has had problems with lateral epicondylitis in the past.  She is also noticed some knots on her hands.  She has history of Raynaud's for many years.  She also gives history of photosensitivity.  She has history of supraventricular tachycardia and was recently diagnosed with atrial fibrillation.  She states the dose of metoprolol was increased.  Anticoagulation was not advised.  She has taken Paxil in the past for fibromyalgia but could not tolerate it.  She is currently taking Lexapro due to increased stress.  There is family history of rheumatoid arthritis in paternal grandmother.  No other autoimmune diseases are in the family.  She is gravida 1, para 1.  There is no history of DVTs.  There is no history of preeclampsia.  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  She also walks on a regular basis.   Activities of Daily Living:  Patient reports morning stiffness for all day. Patient Reports nocturnal pain.  Difficulty dressing/grooming: Denies Difficulty climbing stairs: Denies Difficulty getting out of chair: Denies Difficulty using hands for taps, buttons, cutlery, and/or writing: Reports  Review of Systems  Constitutional:  Positive for fatigue.  HENT:  Positive for mouth sores and mouth dryness.   Eyes:  Positive for dryness.  Respiratory:  Negative for difficulty breathing.   Cardiovascular:  Positive for palpitations. Negative for chest pain.  Gastrointestinal:  Negative for blood in stool, constipation and diarrhea.  Endocrine: Negative for increased urination.  Genitourinary:  Negative for involuntary urination.  Musculoskeletal:  Positive for joint pain, gait problem, joint pain, myalgias, muscle weakness, morning stiffness, muscle tenderness and myalgias. Negative for joint swelling.  Skin:  Positive for color change, hair loss and sensitivity to sunlight. Negative for rash.  Allergic/Immunologic: Negative for susceptible to infections.  Neurological:  Positive for dizziness and headaches.  Hematological:  Negative for swollen glands.  Psychiatric/Behavioral:  Positive for depressed mood. Negative for sleep disturbance. The patient is nervous/anxious.     PMFS History:  Patient Active Problem List   Diagnosis Date Noted   Hypercholesterolemia 06/17/2022   Vitamin  D deficiency 06/17/2022   Multiple thyroid nodules 01/01/2022   Solitary pulmonary nodule 11/06/2019   Subjective tinnitus of both ears 06/06/2018   Mixed conductive and sensorineural hearing loss of right ear with unrestricted hearing of left ear 06/06/2018   MDD (major depressive disorder), recurrent episode (HCC) 01/09/2018   Fibromyalgia 01/09/2018   Sjogren's disease (HCC) 01/09/2018   Generalized anxiety disorder 03/18/2016    SVT (supraventricular tachycardia) 03/05/2015   GERD 06/21/2008    Past Medical History:  Diagnosis Date   Anal fissure    Arthralgia of temporomandibular joint    MIGRAINES AND REGULAR   Atrial fibrillation (HCC)    per patient   Calculus of gallbladder without mention of cholecystitis or obstruction    Complication of anesthesia    Depression    Dysrhythmia    RAPID HEARTBEAT AND SKIPPED BEATS ON METOPROLOL   Family history of adverse reaction to anesthesia    FAMILY MEMBERS HAVE PONV   Fibromyalgia    GERD (gastroesophageal reflux disease)    Heart murmur    IN PAST, TOLD IS GONE NOW   History of kidney stones    History of nephrolithiasis    HLD (hyperlipidemia)    PONV (postoperative nausea and vomiting)    Raynaud's disease    per patient   Sjogren - Larsson's syndrome    Sjogren's syndrome (HCC)     Family History  Problem Relation Age of Onset   Stroke Mother    Heart Problems Mother    Kidney failure Mother    Colon polyps Father    Bone cancer Other        Grandmother   Diabetes Other        Grandmother   Heart disease Other        Grandmother   Healthy Son    Past Surgical History:  Procedure Laterality Date   ABDOMINAL HYSTERECTOMY     CHOLECYSTECTOMY  08/20/08   MANDIBLE SURGERY     VENTRAL HERNIA REPAIR N/A 06/12/2019   Procedure: VENTRAL HERNIA REPAIR WITH MESH;  Surgeon: Griselda Miner, MD;  Location: [facility] OR;  Service: General;  Laterality: N/A;   WISDOM TOOTH EXTRACTION     Social History   Social History Narrative   Married      Caffeine: 2 cups/day      No regular exercise          There is no immunization history on file for this patient.   Objective: Vital Signs: BP 128/79 (BP Location: Right Arm, Patient Position: Sitting, Cuff Size: Normal)   Pulse 65   Resp 15   Ht 5\' 7"  (1.702 m)   Wt 160 lb 3.2 oz (72.7 kg)   LMP 07/03/2018   BMI 25.09 kg/m    Physical Exam Vitals and nursing note reviewed.  Constitutional:       Appearance: She is well-developed.  HENT:     Head: Normocephalic and atraumatic.  Eyes:     Conjunctiva/sclera: Conjunctivae normal.  Cardiovascular:     Rate and Rhythm: Normal rate and regular rhythm.     Heart sounds: Normal heart sounds.  Pulmonary:     Effort: Pulmonary effort is normal.     Breath sounds: Normal breath sounds.  Abdominal:     General: Bowel sounds are normal.     Palpations: Abdomen is soft.  Musculoskeletal:     Cervical back: Normal range of motion.  Lymphadenopathy:     Cervical: No cervical adenopathy.  Skin:    General: Skin is warm and dry.     Capillary Refill: Capillary refill takes less than 2 seconds.  Neurological:     Mental Status: She is alert and oriented to person, place, and time.  Psychiatric:        Behavior: Behavior normal.      Musculoskeletal Exam: Cervical, thoracic and lumbar spine were in good range of motion elbow joints, wrist joints, MCPs PIPs and DIPs were in good range of motion without any synovitis.  She had bilateral PIP and DIP thickening with no synovitis.  Hip joints and knee joints in good range of motion.  There was no tenderness or warmth on palpation.  There was no tenderness over ankles or MTPs.  She had no plantar fasciitis or Achilles tendinitis.  CDAI Exam: CDAI Score: -- Patient Global: --; Provider Global: -- Swollen: --; Tender: -- Joint Exam 02/02/2023   No joint exam has been documented for this visit   There is currently no information documented on the homunculus. Go to the Rheumatology activity and complete the homunculus joint exam.  Investigation: No additional findings.  Imaging: No results found.  Recent Labs: Lab Results  Component Value Date   WBC 4.3 02/20/2021   HGB 14.1 02/20/2021   PLT 271.0 02/20/2021   NA 139 06/10/2022   K 3.8 06/10/2022   CL 103 06/10/2022   CO2 30 06/10/2022   GLUCOSE 78 06/10/2022   BUN 13 06/10/2022   CREATININE 0.86 06/10/2022   BILITOT 0.4  06/10/2022   ALKPHOS 61 06/10/2022   AST 27 06/10/2022   ALT 21 06/10/2022   PROT 8.2 06/10/2022   ALBUMIN 4.4 06/10/2022   CALCIUM 9.5 06/10/2022   GFRAA >60 05/21/2020    Speciality Comments: No specialty comments available.  Procedures:  No procedures performed Allergies: Hydrocodone-guaifenesin, Sulfonamide derivatives, Trazodone and nefazodone, and Latex   Assessment / Plan:     Visit Diagnoses: Sjogren's syndrome with other organ involvement [facility]) -patient states that she was diagnosed with Sjogren's syndrome by Dr. Oleta Mouse approximately 20 years ago.  She was treated with Plaquenil and meloxicam for about 4 years and then she discontinued as she did not notice any benefit from the medications.  She was under Dr. Dierdre Forth and later Dr. Kathi Ludwig.  She states Dr. Kathi Ludwig prescribed her some narcotics for fibromyalgia and discomfort which was later discontinued by her PCP.  She continues to have dry mouth and dry eye symptoms.  She denies any shortness of breath.  She gives history of palpitations and was diagnosed with supraventricular tachycardia in the past.  She was recently diagnosed with atrial fibrillation and the dose of beta-blocker was increased as per patient.  Detailed counsel regarding Sjogren syndrome was provided.  Different treatment options and their side effects were discussed.  I also discussed the option of pilocarpine.  Patient would like to review the medication.  I placed information in the AVS.  Over-the-counter products were discussed at length.  Plan: Urinalysis, Routine w reflex microscopic  Positive ANA (antinuclear antibody) -patient gives history of positive ANA.  I do not have those labs available.  I will obtain autoimmune labs today.  Plan: ANA, RNP Antibody, Anti-Smith antibody, Sjogrens syndrome-A extractable nuclear antibody, Sjogrens syndrome-B extractable nuclear antibody, Anti-DNA antibody, double-stranded, Anti-scleroderma antibody, C3 and C4  Pain in both  hands -she complains of pain and discomfort in her bilateral hands.  No synovitis was noted.  She had bilateral PIP and DIP thickening more  prominent in the right hand consistent with osteoarthritis.  Detailed counsel regarding osteoarthritis was provided.  A handout was also given.  A handout on hand muscle strengthening exercises was given.  Joint protection muscle strengthening was advised.  There is family history of rheumatoid arthritis in paternal grandmother.  No synovitis was noted.  Plan: Sedimentation rate, Rheumatoid factor, Cyclic citrul peptide antibody, IgG  Other fatigue -she gives history of chronic fatigue for many years.  She also has history of fibromyalgia syndrome.  Plan: CBC with Differential/Platelet, COMPLETE METABOLIC PANEL WITH GFR, CK, Serum protein electrophoresis with reflex  Fibromyalgia-patient gives history of generalized pain and discomfort.  She had positive tender points and hyperalgesia.  She also gets frequent lateral epicondylitis.  Benefits of water aerobics, swimming and stretching were discussed.  Patient has been walking on a daily basis.  I also offered referral to integrative therapies which she declined.  SVT (supraventricular tachycardia)-she has been under care of her cardiologist.  Patient states she was recently diagnosed with atrial fibrillation.  Anticoagulation was not advised.  Solitary pulmonary nodule - Evaluated by Dr. Corliss Blacker.  Patient had CT scan in June 2023.  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  Subjective tinnitus of both ears  Vitamin D deficiency  Orders: Orders Placed This Encounter  Procedures   CBC with Differential/Platelet   COMPLETE METABOLIC PANEL WITH GFR   Sedimentation rate   Urinalysis, Routine w reflex microscopic   CK    Rheumatoid factor   Cyclic citrul peptide antibody, IgG   ANA   RNP Antibody   Anti-Smith antibody   Sjogrens syndrome-A extractable nuclear antibody   Sjogrens syndrome-B extractable nuclear antibody   Anti-DNA antibody, double-stranded   Anti-scleroderma antibody   C3 and C4   Serum protein electrophoresis with reflex   No orders of the defined types were placed in this encounter.    Follow-Up Instructions: Return for Fatigue, sicca.   Pollyann Savoy, MD  Note - This record has been created using Animal nutritionist.  Chart creation errors have been sought, but may not always  have been located. Such creation errors do not reflect on  the standard of medical care.

## 2023-02-01 DIAGNOSIS — E042 Nontoxic multinodular goiter: Secondary | ICD-10-CM | POA: Diagnosis not present

## 2023-02-02 ENCOUNTER — Ambulatory Visit: Payer: BC Managed Care – PPO | Attending: Rheumatology | Admitting: Rheumatology

## 2023-02-02 ENCOUNTER — Encounter: Payer: Self-pay | Admitting: Rheumatology

## 2023-02-02 VITALS — BP 128/79 | HR 65 | Resp 15 | Ht 67.0 in | Wt 160.2 lb

## 2023-02-02 DIAGNOSIS — M797 Fibromyalgia: Secondary | ICD-10-CM

## 2023-02-02 DIAGNOSIS — H9071 Mixed conductive and sensorineural hearing loss, unilateral, right ear, with unrestricted hearing on the contralateral side: Secondary | ICD-10-CM

## 2023-02-02 DIAGNOSIS — R5383 Other fatigue: Secondary | ICD-10-CM

## 2023-02-02 DIAGNOSIS — I471 Supraventricular tachycardia, unspecified: Secondary | ICD-10-CM

## 2023-02-02 DIAGNOSIS — E559 Vitamin D deficiency, unspecified: Secondary | ICD-10-CM

## 2023-02-02 DIAGNOSIS — M3509 Sicca syndrome with other organ involvement: Secondary | ICD-10-CM | POA: Diagnosis not present

## 2023-02-02 DIAGNOSIS — E7800 Pure hypercholesterolemia, unspecified: Secondary | ICD-10-CM

## 2023-02-02 DIAGNOSIS — F32A Depression, unspecified: Secondary | ICD-10-CM

## 2023-02-02 DIAGNOSIS — F419 Anxiety disorder, unspecified: Secondary | ICD-10-CM

## 2023-02-02 DIAGNOSIS — M79642 Pain in left hand: Secondary | ICD-10-CM

## 2023-02-02 DIAGNOSIS — R911 Solitary pulmonary nodule: Secondary | ICD-10-CM

## 2023-02-02 DIAGNOSIS — H9313 Tinnitus, bilateral: Secondary | ICD-10-CM

## 2023-02-02 DIAGNOSIS — K219 Gastro-esophageal reflux disease without esophagitis: Secondary | ICD-10-CM

## 2023-02-02 DIAGNOSIS — R768 Other specified abnormal immunological findings in serum: Secondary | ICD-10-CM

## 2023-02-02 DIAGNOSIS — M79641 Pain in right hand: Secondary | ICD-10-CM

## 2023-02-02 DIAGNOSIS — E042 Nontoxic multinodular goiter: Secondary | ICD-10-CM

## 2023-02-02 NOTE — Patient Instructions (Signed)
Pilocarpine Tablets What is this medication? PILOCARPINE (PYE loe KAR peen) treats dry mouth. It works by increasing the amount of saliva in the mouth, which makes it easier to speak and swallow. This medicine may be used for other purposes; ask your health care provider or pharmacist if you have questions. COMMON BRAND NAME(S): Salagen What should I tell my care team before I take this medication? They need to know if you have any of these conditions: Eye infection or other eye problems Glaucoma Heart disease Liver disease Lung or breathing disease, such as asthma An unusual or allergic reaction to pilocarpine, other medications, foods, dyes, or preservatives Pregnant or trying to get pregnant Breast-feeding How should I use this medication? Take this medication by mouth with a full glass of water. Take it as directed on the prescription label at the same time every day. Keep taking it unless your care team tells you to stop. Talk to your care team about the use of this medication in children. Special care may be needed. Overdosage: If you think you have taken too much of this medicine contact a poison control center or emergency room at once. NOTE: This medicine is only for you. Do not share this medicine with others. What if I miss a dose? If you miss a dose, take it as soon as you can. If it is almost time for your next dose, take only that dose. Do not take double or extra doses. What may interact with this medication? Antihistamines for allergy, cough, and cold Atropine Certain medications for Alzheimer disease, such as donepezil, galantamine, rivastigmine Certain medications for bladder problems, such as bethanechol, oxybutynin, tolterodine Certain medications for Parkinson disease, such as benztropine, trihexyphenidyl Certain medications for quitting smoking, such as nicotine Certain medications for stomach problems, such as dicyclomine, hyoscyamine Certain medications for  travel sickness, such as scopolamine Ipratropium Medications for blood pressure or heart problems, such as metoprolol This list may not describe all possible interactions. Give your health care provider a list of all the medicines, herbs, non-prescription drugs, or dietary supplements you use. Also tell them if you smoke, drink alcohol, or use illegal drugs. Some items may interact with your medicine. What should I watch for while using this medication? Visit your care team for regular checks on your progress. Tell your care team if your symptoms do not get better or if they get worse. You may get blurry vision or have trouble telling how far something is from you. This may be a problem at night or when the lights are low. Do not drive, use machinery, or do anything that needs clear vision until you know how this medication affects you. If you sweat a lot, drink enough to replace fluids. Do not get dehydrated. What side effects may I notice from receiving this medication? Side effects that you should report to your care team as soon as possible: Allergic reactions--skin rash, itching, hives, swelling of the face, lips, tongue, or throat Fast or irregular heartbeat Increase in blood pressure Low blood pressure--dizziness, feeling faint or lightheaded, blurry vision Slow heartbeat--dizziness, feeling faint or lightheaded, confusion, trouble breathing, unusual weakness or fatigue Side effects that usually do not require medical attention (report to your care team if they continue or are bothersome): Change in vision Chills Diarrhea Excessive sweating Flushing Headache Increased need to urinate This list may not describe all possible side effects. Call your doctor for medical advice about side effects. You may report side effects to FDA at 1-800-FDA-1088.  Where should I keep my medication? Keep out of the reach of children and pets. Store at room temperature between 15 and 30 degrees C (59 and  86 degrees F). Get rid of any unused medication after the expiration date. To get rid of medications that are no longer needed or have expired: Take the medications to a medication take-back program. Check with your pharmacy or law enforcement to find a location. If your cannot return the medication, check the label or package insert to see if the medication should be thrown out in the garbage or flushed down the toilet. If you are not sure, ask your care team. If it is safe to put it in the trash, take the medication out of the container. Mix the medication with cat litter, dirt, coffee grounds, or other unwanted substance. Seal the mixture in a bag or container. Put it in the trash. NOTE: This sheet is a summary. It may not cover all possible information. If you have questions about this medicine, talk to your doctor, pharmacist, or health care provider.  2023 Elsevier/Gold Standard (2021-08-15 00:00:00) Osteoarthritis  Osteoarthritis is a type of arthritis. It refers to joint pain or joint disease. Osteoarthritis affects tissue that covers the ends of bones in joints (cartilage). Cartilage acts as a cushion between the bones and helps them move smoothly. Osteoarthritis occurs when cartilage in the joints gets worn down. Osteoarthritis is sometimes called "wear and tear" arthritis. Osteoarthritis is the most common form of arthritis. It often occurs in older people. It is a condition that gets worse over time. The joints most often affected by this condition are in the fingers, toes, hips, knees, and spine, including the neck and lower back. What are the causes? This condition is caused by the wearing down of cartilage that covers the ends of bones. What increases the risk? The following factors may make you more likely to develop this condition: Being age 9 or older. Obesity. Overuse of joints. Past injury of a joint. Past surgery on a joint. Family history of osteoarthritis. What are the  signs or symptoms? The main symptoms of this condition are pain, swelling, and stiffness in the joint. Other symptoms may include: An enlarged joint. More pain and further damage caused by small pieces of bone or cartilage that break off and float inside of the joint. Small deposits of bone (osteophytes) that grow on the edges of the joint. A grating or scraping feeling inside the joint when you move it. Popping or creaking sounds when you move. Difficulty walking or exercising. An inability to grip items, twist your hand, or control the movements of your hands and fingers. How is this diagnosed? This condition may be diagnosed based on: Your medical history. A physical exam. Your symptoms. X-rays of the affected joints. Blood tests to rule out other types of arthritis. How is this treated? There is no cure for this condition, but treatment can help control pain and improve joint function. Treatment may include a combination of therapies, such as: Pain relief techniques, such as: Applying heat and cold to the joint. Massage. A form of talk therapy called cognitive behavioral therapy (CBT). This therapy helps you set goals and follow up on the changes that you make. Medicines for pain and inflammation. The medicines can be taken by mouth or applied to the skin. They include: NSAIDs, such as ibuprofen. Prescription medicines. Strong anti-inflammatory medicines (corticosteroids). Certain nutritional supplements. A prescribed exercise program. You may work with a physical therapist.  Assistive devices, such as a brace, wrap, splint, specialized glove, or cane. A weight control plan. Surgery, such as: An osteotomy. This is done to reposition the bones and relieve pain or to remove loose pieces of bone and cartilage. Joint replacement surgery. You may need this surgery if you have advanced osteoarthritis. Follow these instructions at home: Activity Rest your affected joints as told by  your health care provider. Exercise as told by your provider. The provider may recommend specific types of exercise, such as: Strengthening exercises. These are done to strengthen the muscles that support joints affected by arthritis. Aerobic activities. These are exercises, such as brisk walking or water aerobics, that increase your heart rate. Range-of-motion activities. These help your joints move more easily. Balance and agility exercises. Managing pain, stiffness, and swelling     If told, apply heat to the affected area as often as told by your provider. Use the heat source that your provider recommends, such as a moist heat pack or a heating pad. If you have a removable assistive device, remove it as told by your provider. Place a towel between your skin and the heat source. If your provider tells you to keep the assistive device on while you apply heat, place a towel between the assistive device and the heat source. Leave the heat on for 20-30 minutes. If told, put ice on the affected area. If you have a removable assistive device, remove it as told by your provider. Put ice in a plastic bag. Place a towel between your skin and the bag. If your provider tells you to keep the assistive device on during icing, place a towel between the assistive device and the bag. Leave the ice on for 20 minutes, 2-3 times a day. If your skin turns bright red, remove the ice or heat right away to prevent skin damage. The risk of damage is higher if you cannot feel pain, heat, or cold. Move your fingers or toes often to reduce stiffness and swelling. Raise (elevate) the affected area above the level of your heart while you are sitting or lying down. General instructions Take over-the-counter and prescription medicines only as told by your provider. Maintain a healthy weight. Follow instructions from your provider for weight control. Do not use any products that contain nicotine or tobacco. These  products include cigarettes, chewing tobacco, and vaping devices, such as e-cigarettes. If you need help quitting, ask your provider. Use assistive devices as told by your provider. Where to find more information [employer] of Arthritis and Musculoskeletal and Skin Diseases: niams.http://www.myers.net/ [employer] on Aging: BaseRingTones.pl American College of Rheumatology: rheumatology.org Contact a health care provider if: You have redness, swelling, or a feeling of warmth in a joint that gets worse. You have a fever along with joint or muscle aches. You develop a rash. You have trouble doing your normal activities. You have pain that gets worse and is not relieved by pain medicine. This information is not intended to replace advice given to you by your health care provider. Make sure you discuss any questions you have with your health care provider. Document Revised: 04/30/2022 Document Reviewed: 04/30/2022 Elsevier Patient Education  2023 [employer] Hand Exercises Hand exercises can be helpful for almost anyone. These exercises can strengthen the hands, improve flexibility and movement, and increase blood flow to the hands. These results can make work and daily tasks easier. Hand exercises can be especially helpful for people who have joint pain from arthritis or have  nerve damage from overuse (carpal tunnel syndrome). These exercises can also help people who have injured a hand. Exercises Most of these hand exercises are gentle stretching and motion exercises. It is usually safe to do them often throughout the day. Warming up your hands before exercise may help to reduce stiffness. You can do this with gentle massage or by placing your hands in warm water for 10-15 minutes. It is normal to feel some stretching, pulling, tightness, or mild discomfort as you begin new exercises. This will gradually improve. Stop an exercise right away if you feel sudden, severe pain or your pain gets worse.  Ask your health care provider which exercises are best for you. Knuckle bend or "claw" fist  Stand or sit with your arm, hand, and all five fingers pointed straight up. Make sure to keep your wrist straight during the exercise. Gently bend your fingers down toward your palm until the tips of your fingers are touching the top of your palm. Keep your big knuckle straight and just bend the small knuckles in your fingers. Hold this position for __________ seconds. Straighten (extend) your fingers back to the starting position. Repeat this exercise 5-10 times with each hand. Full finger fist  Stand or sit with your arm, hand, and all five fingers pointed straight up. Make sure to keep your wrist straight during the exercise. Gently bend your fingers into your palm until the tips of your fingers are touching the middle of your palm. Hold this position for __________ seconds. Extend your fingers back to the starting position, stretching every joint fully. Repeat this exercise 5-10 times with each hand. Straight fist Stand or sit with your arm, hand, and all five fingers pointed straight up. Make sure to keep your wrist straight during the exercise. Gently bend your fingers at the big knuckle, where your fingers meet your hand, and the middle knuckle. Keep the knuckle at the tips of your fingers straight and try to touch the bottom of your palm. Hold this position for __________ seconds. Extend your fingers back to the starting position, stretching every joint fully. Repeat this exercise 5-10 times with each hand. Tabletop  Stand or sit with your arm, hand, and all five fingers pointed straight up. Make sure to keep your wrist straight during the exercise. Gently bend your fingers at the big knuckle, where your fingers meet your hand, as far down as you can while keeping the small knuckles in your fingers straight. Think of forming a tabletop with your fingers. Hold this position for __________  seconds. Extend your fingers back to the starting position, stretching every joint fully. Repeat this exercise 5-10 times with each hand. Finger spread  Place your hand flat on a table with your palm facing down. Make sure your wrist stays straight as you do this exercise. Spread your fingers and thumb apart from each other as far as you can until you feel a gentle stretch. Hold this position for __________ seconds. Bring your fingers and thumb tight together again. Hold this position for __________ seconds. Repeat this exercise 5-10 times with each hand. Making circles  Stand or sit with your arm, hand, and all five fingers pointed straight up. Make sure to keep your wrist straight during the exercise. Make a circle by touching the tip of your thumb to the tip of your index finger. Hold for __________ seconds. Then open your hand wide. Repeat this motion with your thumb and each finger on your hand. Repeat  this exercise 5-10 times with each hand. Thumb motion  Sit with your forearm resting on a table and your wrist straight. Your thumb should be facing up toward the ceiling. Keep your fingers relaxed as you move your thumb. Lift your thumb up as high as you can toward the ceiling. Hold for __________ seconds. Bend your thumb across your palm as far as you can, reaching the tip of your thumb for the small finger (pinkie) side of your palm. Hold for __________ seconds. Repeat this exercise 5-10 times with each hand. Grip strengthening  Hold a stress ball or other soft ball in the middle of your hand. Slowly increase the pressure, squeezing the ball as much as you can without causing pain. Think of bringing the tips of your fingers into the middle of your palm. All of your finger joints should bend when doing this exercise. Hold your squeeze for __________ seconds, then relax. Repeat this exercise 5-10 times with each hand. Contact a health care provider if: Your hand pain or discomfort  gets much worse when you do an exercise. Your hand pain or discomfort does not improve within 2 hours after you exercise. If you have any of these problems, stop doing these exercises right away. Do not do them again unless your health care provider says that you can. Get help right away if: You develop sudden, severe hand pain or swelling. If this happens, stop doing these exercises right away. Do not do them again unless your health care provider says that you can. This information is not intended to replace advice given to you by your health care provider. Make sure you discuss any questions you have with your health care provider. Document Revised: 12/12/2020 Document Reviewed: 12/19/2020 Elsevier Patient Education  2023 [employer].

## 2023-02-03 LAB — ANTI-SMITH ANTIBODY: ENA SM Ab Ser-aCnc: <1 AI

## 2023-02-03 LAB — PROTEIN ELECTROPHORESIS, SERUM, WITH REFLEX: Total Protein: 8.8 g/dL — ABNORMAL HIGH (ref 6.1–8.1)

## 2023-02-03 LAB — URINALYSIS, ROUTINE W REFLEX MICROSCOPIC: Bilirubin Urine: NEGATIVE

## 2023-02-03 LAB — C3 AND C4
C3 Complement: 132 mg/dL (ref 83–193)
C4 Complement: 27 mg/dL (ref 15–57)

## 2023-02-03 LAB — SJOGRENS SYNDROME-B EXTRACTABLE NUCLEAR ANTIBODY: SSB (La) (ENA) Antibody, IgG: <1 AI

## 2023-02-03 LAB — ANTI-SCLERODERMA ANTIBODY: Scleroderma (Scl-70) (ENA) Antibody, IgG: <1 AI

## 2023-02-04 LAB — URINALYSIS, ROUTINE W REFLEX MICROSCOPIC: Hyaline Cast: NONE SEEN /LPF

## 2023-02-04 LAB — CYCLIC CITRUL PEPTIDE ANTIBODY, IGG: Cyclic Citrullin Peptide Ab: <16 UNITS

## 2023-02-04 LAB — ANTI-DNA ANTIBODY, DOUBLE-STRANDED: ds DNA Ab: <1 IU/mL

## 2023-02-04 LAB — ANTI-NUCLEAR AB-TITER (ANA TITER)

## 2023-02-04 LAB — RHEUMATOID FACTOR: Rheumatoid fact SerPl-aCnc: 123 IU/mL — ABNORMAL HIGH (ref <14)

## 2023-02-05 LAB — COMPLETE METABOLIC PANEL WITH GFR
AG Ratio: 1.2 (calc) (ref 1.0–2.5)
ALT: 21 U/L (ref 6–29)
AST: 30 U/L (ref 10–35)
Albumin: 4.7 g/dL (ref 3.6–5.1)
Alkaline phosphatase (APISO): 66 U/L (ref 37–153)
BUN: 13 mg/dL (ref 7–25)
CO2: 28 mmol/L (ref 20–32)
Calcium: 9.4 mg/dL (ref 8.6–10.4)
Chloride: 104 mmol/L (ref 98–110)
Creat: 0.8 mg/dL (ref 0.50–1.03)
Globulin: 3.9 g/dL (calc) — ABNORMAL HIGH (ref 1.9–3.7)
Glucose, Bld: 77 mg/dL (ref 65–99)
Potassium: 3.8 mmol/L (ref 3.5–5.3)
Sodium: 140 mmol/L (ref 135–146)
Total Bilirubin: 0.4 mg/dL (ref 0.2–1.2)
Total Protein: 8.6 g/dL — ABNORMAL HIGH (ref 6.1–8.1)
eGFR: 89 mL/min/{1.73_m2} (ref >=60)

## 2023-02-05 LAB — URINALYSIS, ROUTINE W REFLEX MICROSCOPIC
Bacteria, UA: NONE SEEN /HPF
Glucose, UA: NEGATIVE
Hgb urine dipstick: NEGATIVE
Ketones, ur: NEGATIVE
Nitrite: NEGATIVE
Protein, ur: NEGATIVE
RBC / HPF: NONE SEEN /HPF (ref 0–2)
Specific Gravity, Urine: 1.005 (ref 1.001–1.035)
Squamous Epithelial / HPF: NONE SEEN /HPF (ref <=5)
pH: 6.5 (ref 5.0–8.0)

## 2023-02-05 LAB — SEDIMENTATION RATE: Sed Rate: 31 mm/h — ABNORMAL HIGH (ref 0–30)

## 2023-02-05 LAB — CBC WITH DIFFERENTIAL/PLATELET
Absolute Monocytes: 394 cells/uL (ref 200–950)
Basophils Absolute: 29 cells/uL (ref 0–200)
Basophils Relative: 0.6 %
Eosinophils Absolute: 101 cells/uL (ref 15–500)
Eosinophils Relative: 2.1 %
HCT: 43.4 % (ref 35.0–45.0)
Hemoglobin: 14.4 g/dL (ref 11.7–15.5)
Lymphs Abs: 1066 cells/uL (ref 850–3900)
MCH: 28.3 pg (ref 27.0–33.0)
MCHC: 33.2 g/dL (ref 32.0–36.0)
MCV: 85.4 fL (ref 80.0–100.0)
MPV: 10.2 fL (ref 7.5–12.5)
Monocytes Relative: 8.2 %
Neutro Abs: 3211 cells/uL (ref 1500–7800)
Neutrophils Relative %: 66.9 %
Platelets: 258 10*3/uL (ref 140–400)
RBC: 5.08 10*6/uL (ref 3.80–5.10)
RDW: 12.8 % (ref 11.0–15.0)
Total Lymphocyte: 22.2 %
WBC: 4.8 10*3/uL (ref 3.8–10.8)

## 2023-02-05 LAB — PROTEIN ELECTROPHORESIS, SERUM, WITH REFLEX
Albumin ELP: 4.9 g/dL — ABNORMAL HIGH (ref 3.8–4.8)
Alpha 1: 0.2 g/dL (ref 0.2–0.3)
Alpha 2: 0.7 g/dL (ref 0.5–0.9)
Beta 2: 0.5 g/dL (ref 0.2–0.5)
Beta Globulin: 0.5 g/dL (ref 0.4–0.6)
Gamma Globulin: 2 g/dL — ABNORMAL HIGH (ref 0.8–1.7)

## 2023-02-05 LAB — ANA: Anti Nuclear Antibody (ANA): POSITIVE — AB

## 2023-02-05 LAB — ANTI-NUCLEAR AB-TITER (ANA TITER): ANA Titer 1: 1:1280 {titer} — ABNORMAL HIGH

## 2023-02-05 LAB — RNP ANTIBODY: Ribonucleic Protein(ENA) Antibody, IgG: 1.4 AI — AB

## 2023-02-05 LAB — SJOGRENS SYNDROME-A EXTRACTABLE NUCLEAR ANTIBODY: SSA (Ro) (ENA) Antibody, IgG: >8 AI — AB

## 2023-02-05 LAB — CK: Total CK: 91 U/L (ref 29–143)

## 2023-02-09 NOTE — Progress Notes (Signed)
Patient has positive ANA, positive RNP, positive rheumatoid factor, positive SSA antibody.  His labs are consistent with Sjogren's.  I will discuss results at the follow-up visit.

## 2023-02-09 NOTE — Progress Notes (Signed)
Office Visit Note  Patient: Jill Leonard             Date of Birth: 1971/01/14           MRN: [id number]             PCP: Excell Seltzer, MD Referring: Excell Seltzer, MD Visit Date: 02/23/2023 Occupation: @GUAROCC @  Subjective:  Pain in both hips, dry mouth and dry eyes  History of Present Illness: Jill Leonard is a 52 y.o. female with Sjogren's, osteoarthritis and fibromyalgia syndrome.  She returns today after her initial visit on Feb 02, 2023.  Patient states she continues to have dry mouth and dry eye symptoms which are manageable with over-the-counter products.  She reviewed the information pilocarpine and decided not to take it.  She recalls taking hydroxychloroquine for 3 years many years ago and had frequent infections.  She does not want to take any immunosuppressive agents.  She continues to have some stiffness in her hands and feet but no swelling.  She has been experiencing some discomfort over hips which she describes over trochanteric bursa.  She has some shortness of breath on exertion.  She had PFTs last year by Dr. Delton Coombes which were unremarkable.  She is also followed by cardiologist for palpitations.    Activities of Daily Living:  Patient reports morning stiffness for 4-5 hours.   Patient Reports nocturnal pain.  Difficulty dressing/grooming: Denies Difficulty climbing stairs: Reports Difficulty getting out of chair: Denies Difficulty using hands for taps, buttons, cutlery, and/or writing: Reports  Review of Systems  Constitutional:  Positive for fatigue.  HENT:  Positive for mouth dryness. Negative for mouth sores.   Eyes:  Positive for dryness.  Respiratory:  Positive for cough, shortness of breath and wheezing.   Cardiovascular:  Positive for palpitations and swelling in legs/feet. Negative for chest pain.  Gastrointestinal:  Negative for blood in stool, constipation and diarrhea.  Endocrine: Negative for increased urination.  Genitourinary:  Negative  for involuntary urination.  Musculoskeletal:  Positive for joint pain, joint pain, myalgias, muscle weakness, morning stiffness, muscle tenderness and myalgias. Negative for gait problem and joint swelling.  Skin:  Negative for color change, rash, hair loss and sensitivity to sunlight.  Allergic/Immunologic: Negative for susceptible to infections.  Neurological:  Positive for dizziness, numbness and headaches.  Hematological:  Negative for swollen glands.  Psychiatric/Behavioral:  Positive for depressed mood. Negative for sleep disturbance. The patient is nervous/anxious.     PMFS History:  Patient Active Problem List   Diagnosis Date Noted   Hypercholesterolemia 06/17/2022   Vitamin D deficiency 06/17/2022   Multiple thyroid nodules 01/01/2022   Solitary pulmonary nodule 11/06/2019   Subjective tinnitus of both ears 06/06/2018   Mixed conductive and sensorineural hearing loss of right ear with unrestricted hearing of left ear 06/06/2018   MDD (major depressive disorder), recurrent episode (HCC) 01/09/2018   Fibromyalgia 01/09/2018   Sjogren's disease (HCC) 01/09/2018   Generalized anxiety disorder 03/18/2016   SVT (supraventricular tachycardia) 03/05/2015   GERD 06/21/2008    Past Medical History:  Diagnosis Date   Anal fissure    Arthralgia of temporomandibular joint    MIGRAINES AND REGULAR   Atrial fibrillation (HCC)    per patient   Calculus of gallbladder without mention of cholecystitis or obstruction    Complication of anesthesia    Depression    Dysrhythmia    RAPID HEARTBEAT AND SKIPPED BEATS ON METOPROLOL   Family history of  adverse reaction to anesthesia    FAMILY MEMBERS HAVE PONV   Fibromyalgia    GERD (gastroesophageal reflux disease)    Heart murmur    IN PAST, TOLD IS GONE NOW   History of kidney stones    History of nephrolithiasis    HLD (hyperlipidemia)    PONV (postoperative nausea and vomiting)    Raynaud's disease    per patient   Sjogren -  Larsson's syndrome    Sjogren's syndrome (HCC)     Family History  Problem Relation Age of Onset   Stroke Mother    Heart Problems Mother    Kidney failure Mother    Colon polyps Father    Bone cancer Other        Grandmother   Diabetes Other        Grandmother   Heart disease Other        Grandmother   Healthy Son    Past Surgical History:  Procedure Laterality Date   ABDOMINAL HYSTERECTOMY     CHOLECYSTECTOMY  08/20/08   MANDIBLE SURGERY     VENTRAL HERNIA REPAIR N/A 06/12/2019   Procedure: VENTRAL HERNIA REPAIR WITH MESH;  Surgeon: Griselda Miner, MD;  Location: [facility] OR;  Service: General;  Laterality: N/A;   WISDOM TOOTH EXTRACTION     Social History   Social History Narrative   Married      Caffeine: 2 cups/day      No regular exercise          There is no immunization history on file for this patient.   Objective: Vital Signs: BP 125/84 (BP Location: Left Arm, Patient Position: Sitting, Cuff Size: Normal)   Pulse 60   Resp 15   Ht 5\' 7"  (1.702 m)   Wt 160 lb 9.6 oz (72.8 kg)   LMP 07/03/2018   BMI 25.15 kg/m    Physical Exam Vitals and nursing note reviewed.  Constitutional:      Appearance: She is well-developed.  HENT:     Head: Normocephalic and atraumatic.  Eyes:     Conjunctiva/sclera: Conjunctivae normal.  Cardiovascular:     Rate and Rhythm: Normal rate and regular rhythm.     Heart sounds: Normal heart sounds.  Pulmonary:     Effort: Pulmonary effort is normal.     Breath sounds: Normal breath sounds.  Abdominal:     General: Bowel sounds are normal.     Palpations: Abdomen is soft.  Musculoskeletal:     Cervical back: Normal range of motion.  Lymphadenopathy:     Cervical: No cervical adenopathy.  Skin:    General: Skin is warm and dry.     Capillary Refill: Capillary refill takes less than 2 seconds.  Neurological:     Mental Status: She is alert and oriented to person, place, and time.  Psychiatric:        Behavior: Behavior  normal.      Musculoskeletal Exam: Cervical, thoracic and lumbar spine were in good range of motion.  Shoulder joints, elbow joints, wrist joints, MCPs PIPs and DIPs in good range of motion.  She had bilateral PIP and DIP thickening consistent with osteoarthritis.  No synovitis was noted.  Hip joints were in good range of motion.  She had tenderness over bilateral trochanteric bursa.  Bilateral knee joints, ankles and MTPs were in good range of motion with no synovitis.  CDAI Exam: CDAI Score: -- Patient Global: --; Provider Global: -- Swollen: --; Tender: --  Joint Exam 02/23/2023   No joint exam has been documented for this visit   There is currently no information documented on the homunculus. Go to the Rheumatology activity and complete the homunculus joint exam.  Investigation: No additional findings.  Imaging: No results found.  Recent Labs: Lab Results  Component Value Date   WBC 4.8 02/02/2023   HGB 14.4 02/02/2023   PLT 258 02/02/2023   NA 140 02/02/2023   K 3.8 02/02/2023   CL 104 02/02/2023   CO2 28 02/02/2023   GLUCOSE 77 02/02/2023   BUN 13 02/02/2023   CREATININE 0.80 02/02/2023   BILITOT 0.4 02/02/2023   ALKPHOS 61 06/10/2022   AST 30 02/02/2023   ALT 21 02/02/2023   PROT 8.6 (H) 02/02/2023   PROT 8.8 (H) 02/02/2023   ALBUMIN 4.4 06/10/2022   CALCIUM 9.4 02/02/2023   GFRAA >60 05/21/2020   Feb 02, 2023 UA negative except for 1+ leukocyte, ANA 1: 1280NS, RNP 1.4, SSA> 8.0, (Smith, SSB, dsDNA, SCL 70 negative), C3-C4 normal, SPEP normal, RF 123, anti-CCP negative, ESR 31, CK 91    Speciality Comments: Plaquenil x 3 years-frequent infections  Procedures:  No procedures performed Allergies: Hydrocodone-guaifenesin, Sulfonamide derivatives, Trazodone and nefazodone, and Latex   Assessment / Plan:     Visit Diagnoses: Sjogren's syndrome with other organ involvement (HCC) - Positive ANA, positive Ro, positive RNP , sicca dxd by Dr. Leida Lauth 20 years  ago.ttd by Dr. Dierdre Forth and Dr. Kathi Ludwig.  Discussed pilocarpine at the last visit.  Patient decided not to take pilocarpine after reviewing the side effects of the medication.  She states over-the-counter medications have been sufficient to control her symptoms.  Detailed counseling on instructions was provided.  Association of Sjogren's with ILD and lymphoma was discussed.  I advised her to contact me if she develops any new symptoms.  Primary osteoarthritis of both hands -she had bilateral PIP and DIP thickening consistent with osteoarthritis.  Joint protection muscle strengthening was discussed.  Natural anti-inflammatories were discussed.  Trochanteric bursitis of both hips-she has been having pain and discomfort over bilateral trochanteric bursa.  A handout 90-minute stretches was given.  SVT (supraventricular tachycardia) - Recently diagnosed with atrial fibrillation per patient.  She is followed by cardiology.  Solitary pulmonary nodule - She was evaluated by Dr. Delton Coombes.  CT scan June 2023.  Patient gives history of shortness of breath only on exertion.  January 2022 PFTs were normal.  Hypercholesterolemia-she is currently not taking any medications.  Gastroesophageal reflux disease without esophagitis-she is on Protonix.  Multiple thyroid nodules  Fibromyalgia-she continues to have some generalized pain and discomfort.  Encourage stretching exercises, benefits of water aerobics and summing were discussed.  Anxiety and depression-she is on Lexapro.  Mixed conductive and sensorineural hearing loss of right ear with unrestricted hearing of left ear  Subjective tinnitus of both ears  Vitamin D deficiency-patient takes vitamin D supplement.  Vitamin D was normal at 42.75 on July 24, 2022.  Orders: No orders of the defined types were placed in this encounter.  No orders of the defined types were placed in this encounter.    Follow-Up Instructions: Return in about 6 months (around  08/25/2023) for Sjogren's.   Pollyann Savoy, MD  Note - This record has been created using Animal nutritionist.  Chart creation errors have been sought, but may not always  have been located. Such creation errors do not reflect on  the standard of medical care.

## 2023-02-23 ENCOUNTER — Ambulatory Visit: Payer: BC Managed Care – PPO | Attending: Rheumatology | Admitting: Rheumatology

## 2023-02-23 ENCOUNTER — Encounter: Payer: Self-pay | Admitting: Rheumatology

## 2023-02-23 VITALS — BP 125/84 | HR 60 | Resp 15 | Ht 67.0 in | Wt 160.6 lb

## 2023-02-23 DIAGNOSIS — K219 Gastro-esophageal reflux disease without esophagitis: Secondary | ICD-10-CM

## 2023-02-23 DIAGNOSIS — M3509 Sicca syndrome with other organ involvement: Secondary | ICD-10-CM | POA: Diagnosis not present

## 2023-02-23 DIAGNOSIS — M19041 Primary osteoarthritis, right hand: Secondary | ICD-10-CM | POA: Diagnosis not present

## 2023-02-23 DIAGNOSIS — F419 Anxiety disorder, unspecified: Secondary | ICD-10-CM

## 2023-02-23 DIAGNOSIS — M19042 Primary osteoarthritis, left hand: Secondary | ICD-10-CM

## 2023-02-23 DIAGNOSIS — M7062 Trochanteric bursitis, left hip: Secondary | ICD-10-CM

## 2023-02-23 DIAGNOSIS — R911 Solitary pulmonary nodule: Secondary | ICD-10-CM

## 2023-02-23 DIAGNOSIS — I471 Supraventricular tachycardia, unspecified: Secondary | ICD-10-CM | POA: Diagnosis not present

## 2023-02-23 DIAGNOSIS — F32A Depression, unspecified: Secondary | ICD-10-CM

## 2023-02-23 DIAGNOSIS — H9313 Tinnitus, bilateral: Secondary | ICD-10-CM

## 2023-02-23 DIAGNOSIS — H9071 Mixed conductive and sensorineural hearing loss, unilateral, right ear, with unrestricted hearing on the contralateral side: Secondary | ICD-10-CM

## 2023-02-23 DIAGNOSIS — E559 Vitamin D deficiency, unspecified: Secondary | ICD-10-CM

## 2023-02-23 DIAGNOSIS — M7061 Trochanteric bursitis, right hip: Secondary | ICD-10-CM

## 2023-02-23 DIAGNOSIS — E042 Nontoxic multinodular goiter: Secondary | ICD-10-CM

## 2023-02-23 DIAGNOSIS — E7800 Pure hypercholesterolemia, unspecified: Secondary | ICD-10-CM

## 2023-02-23 DIAGNOSIS — M797 Fibromyalgia: Secondary | ICD-10-CM

## 2023-02-23 NOTE — Patient Instructions (Signed)
Iliotibial Band Syndrome Rehab Ask your health care provider which exercises are safe for you. Do exercises exactly as told by your health care provider and adjust them as directed. It is normal to feel mild stretching, pulling, tightness, or discomfort as you do these exercises. Stop right away if you feel sudden pain or your pain gets significantly worse. Do not begin these exercises until told by your health care provider. Stretching and range-of-motion exercises These exercises warm up your muscles and joints and improve the movement and flexibility of your hip and pelvis. Quadriceps stretch, prone  Lie on your abdomen (prone position) on a firm surface, such as a bed or padded floor. Bend your left / right knee and reach back to hold your ankle or pant leg. If you cannot reach your ankle or pant leg, loop a belt around your foot and grab the belt instead. Gently pull your heel toward your buttocks. Your knee should not slide out to the side. You should feel a stretch in the front of your thigh and knee (quadriceps). Hold this position for __________ seconds. Repeat __________ times. Complete this exercise __________ times a day. Iliotibial band stretch An iliotibial band is a strong band of muscle tissue that runs from the outer side of your hip to the outer side of your thigh and knee. Lie on your side with your left / right leg in the top position. Bend both of your knees and grab your left / right ankle. Stretch out your bottom arm to help you balance. Slowly bring your top knee back so your thigh goes behind your trunk. Slowly lower your top leg toward the floor until you feel a gentle stretch on the outside of your left / right hip and thigh. If you do not feel a stretch and your knee will not fall farther, place the heel of your other foot on top of your knee and pull your knee down toward the floor with your foot. Hold this position for __________ seconds. Repeat __________ times.  Complete this exercise __________ times a day. Strengthening exercises These exercises build strength and endurance in your hip and pelvis. Endurance is the ability to use your muscles for a long time, even after they get tired. Straight leg raises, side-lying This exercise strengthens the muscles that rotate the leg at the hip and move it away from your body (hip abductors). Lie on your side with your left / right leg in the top position. Lie so your head, shoulder, hip, and knee line up. You may bend your bottom knee to help you balance. Roll your hips slightly forward so your hips are stacked directly over each other and your left / right knee is facing forward. Tense the muscles in your outer thigh and lift your top leg 4-6 inches (10-15 cm). Hold this position for __________ seconds. Slowly lower your leg to return to the starting position. Let your muscles relax completely before doing another repetition. Repeat __________ times. Complete this exercise __________ times a day. Leg raises, prone This exercise strengthens the muscles that move the hips backward (hip extensors). Lie on your abdomen (prone position) on your bed or a firm surface. You can put a pillow under your hips if that is more comfortable for your lower back. Bend your left / right knee so your foot is straight up in the air. Squeeze your buttocks muscles and lift your left / right thigh off the bed. Do not let your back arch. Tense   your thigh muscle as hard as you can without increasing any knee pain. Hold this position for __________ seconds. Slowly lower your leg to return to the starting position and allow it to relax completely. Repeat __________ times. Complete this exercise __________ times a day. Hip hike Stand sideways on a bottom step. Stand on your left / right leg with your other foot unsupported next to the step. You can hold on to a railing or wall for balance if needed. Keep your knees straight and your  torso square. Then lift your left / right hip up toward the ceiling. Slowly let your left / right hip lower toward the floor, past the starting position. Your foot should get closer to the floor. Do not lean or bend your knees. Repeat __________ times. Complete this exercise __________ times a day. This information is not intended to replace advice given to you by your health care provider. Make sure you discuss any questions you have with your health care provider. Document Revised: 11/08/2019 Document Reviewed: 11/08/2019 Elsevier Patient Education  2024 Elsevier Inc.     

## 2023-03-09 ENCOUNTER — Encounter: Payer: Self-pay | Admitting: Cardiovascular Disease

## 2023-03-09 ENCOUNTER — Ambulatory Visit: Payer: BC Managed Care – PPO | Attending: Cardiovascular Disease | Admitting: Cardiovascular Disease

## 2023-03-09 VITALS — BP 114/76 | HR 65 | Ht 67.0 in | Wt 161.4 lb

## 2023-03-09 DIAGNOSIS — I059 Rheumatic mitral valve disease, unspecified: Secondary | ICD-10-CM

## 2023-03-09 DIAGNOSIS — I480 Paroxysmal atrial fibrillation: Secondary | ICD-10-CM

## 2023-03-09 NOTE — Progress Notes (Signed)
Cardiology Office Note   Date:  03/09/2023   ID:  CLOVIA REINE, DOB Nov 01, 1970, MRN [id number]  PCP:  Excell Seltzer, MD  Cardiologist:   Lorine Bears, MD   No chief complaint on file.     History of Present Illness: Jill Leonard is a 52 y.o. female who presents for a follow-up visit regarding palpitations and tachycardia. She has known history of Sjogren's syndrome.  Echocardiogram in January 2016 showed normal LV systolic function with an ejection fraction of 60% with borderline prolapse of anterior mitral valve leaflet with only mild mitral regurgitation and no evidence of pulmonary hypertension. A Holter monitor showed normal sinus rhythm with occasional PVCs and PACs. She had a total of 445 PVCs and 1200 PACs. A treadmill stress test in June of 2016 was normal. she was started on small dose Toprol with improvement in palpitations. She also cut down on caffeine intake.  Most recent echocardiogram in May 2022 showed normal LV systolic function with trivial mitral regurgitation.  She had worsening palpitations in 2023. A 2 week ZIO monitor was done in November which showed sinus rhythm with an average heart rate of 68 bpm.  She was noted to have 1 short run of SVT.  In addition, she was noted to have atrial fibrillation with 1% burden with heart rate ranging from 59 to 160 bpm.  Most of the A-fib episodes were symptomatic.  She reports more of atrial fibrillation over the last 6 months.  On average, she has 1-2 episodes a month lasting anywhere from 30 minutes to 2 to 3 hours.  She is usually tachycardic during these episodes and then becomes bradycardic after conversion.  No dizziness, syncope or presyncope.  Past Medical History:  Diagnosis Date   Anal fissure    Arthralgia of temporomandibular joint    MIGRAINES AND REGULAR   Atrial fibrillation (HCC)    per patient   Calculus of gallbladder without mention of cholecystitis or obstruction    Complication of  anesthesia    Depression    Dysrhythmia    RAPID HEARTBEAT AND SKIPPED BEATS ON METOPROLOL   Family history of adverse reaction to anesthesia    FAMILY MEMBERS HAVE PONV   Fibromyalgia    GERD (gastroesophageal reflux disease)    Heart murmur    IN PAST, TOLD IS GONE NOW   History of kidney stones    History of nephrolithiasis    HLD (hyperlipidemia)    PONV (postoperative nausea and vomiting)    Raynaud's disease    per patient   Sjogren - Larsson's syndrome    Sjogren's syndrome (HCC)     Past Surgical History:  Procedure Laterality Date   ABDOMINAL HYSTERECTOMY     CHOLECYSTECTOMY  08/20/08   MANDIBLE SURGERY     VENTRAL HERNIA REPAIR N/A 06/12/2019   Procedure: VENTRAL HERNIA REPAIR WITH MESH;  Surgeon: Chevis Pretty III, MD;  Location: [facility] OR;  Service: General;  Laterality: N/A;   WISDOM TOOTH EXTRACTION       Current Outpatient Medications  Medication Sig Dispense Refill   acetaminophen (TYLENOL) 500 MG tablet Take 1,000 mg by mouth every 6 (six) hours as needed for moderate pain or headache.      aspirin-acetaminophen-caffeine (EXCEDRIN MIGRAINE) 250-250-65 MG tablet Take 1 tablet by mouth every 8 (eight) hours as needed for headache or migraine.     BIOTIN PO Take by mouth. Biotin 10,000 mcg + Calcium     calcium carbonate (  TUMS - DOSED IN MG ELEMENTAL CALCIUM) 500 MG chewable tablet Chew 500 mg by mouth 3 (three) times daily as needed for indigestion or heartburn.      Carboxymethylcellul-Glycerin (LUBRICATING EYE DROPS OP) Place 1 drop into both eyes daily as needed (dry eyes).     cholecalciferol (VITAMIN D) 1000 UNITS tablet Take 1,000 Units by mouth every evening.     diphenhydramine-acetaminophen (TYLENOL PM) 25-500 MG TABS tablet Take 1 tablet by mouth at bedtime as needed (sleep).      escitalopram (LEXAPRO) 20 MG tablet TAKE 1 TABLET BY MOUTH  DAILY (Patient taking differently: Take 10 mg by mouth daily.) 90 tablet 3   fluticasone (FLONASE) 50 MCG/ACT nasal spray  Place 1 spray into both nostrils daily.     metoprolol succinate (TOPROL-XL) 25 MG 24 hr tablet Take 1 tablet (25 mg total) by mouth daily. 90 tablet 3   pantoprazole (PROTONIX) 40 MG tablet TAKE 1 TABLET BY MOUTH DAILY 90 tablet 3   TART CHERRY PO Take 500 mg by mouth daily.     No current facility-administered medications for this visit.    Allergies:   Hydrocodone-guaifenesin, Sulfonamide derivatives, Trazodone and nefazodone, and Latex    Social History:  The patient  reports that she has never smoked. She has been exposed to tobacco smoke. She has never used smokeless tobacco. She reports current alcohol use. She reports that she does not use drugs.   Family History:  The patient's family history includes Bone cancer in an other family member; Colon polyps in her father; Diabetes in an other family member; Healthy in her son; Heart Problems in her mother; Heart disease in an other family member; Kidney failure in her mother; Stroke in her mother.    ROS:  Please see the history of present illness.   Otherwise, review of systems are positive for none.   All other systems are reviewed and negative.    PHYSICAL EXAM: VS:  BP 114/76 (BP Location: Left Arm, Patient Position: Sitting, Cuff Size: Normal)   Pulse 65   Ht 5\' 7"  (1.702 m)   Wt 161 lb 6.4 oz (73.2 kg)   LMP 07/03/2018   SpO2 93%   BMI 25.28 kg/m  , BMI Body mass index is 25.28 kg/m. GEN: Well nourished, well developed, in no acute distress  HEENT: normal  Neck: no JVD, carotid bruits, or masses Cardiac: RRR; no murmurs, rubs, or gallops,no edema  Respiratory:  clear to auscultation bilaterally, normal work of breathing GI: soft, nontender, nondistended, + BS MS: no deformity or atrophy  Skin: warm and dry, no rash Neuro:  Strength and sensation are intact Psych: euthymic mood, full affect   EKG:  EKG is ordered today. EKG showed normal sinus rhythm with nonspecific T wave changes.  Possible old septal  infarct   Recent Labs: 07/24/2022: TSH 0.92 02/02/2023: ALT 21; BUN 13; Creat 0.80; Hemoglobin 14.4; Platelets 258; Potassium 3.8; Sodium 140    Lipid Panel    Component Value Date/Time   CHOL 219 (H) 06/10/2022 0937   TRIG 79.0 06/10/2022 0937   HDL 57.00 06/10/2022 0937   CHOLHDL 4 06/10/2022 0937   VLDL 15.8 06/10/2022 0937   LDLCALC 146 (H) 06/10/2022 0937      Wt Readings from Last 3 Encounters:  03/09/23 161 lb 6.4 oz (73.2 kg)  02/23/23 160 lb 9.6 oz (72.8 kg)  02/02/23 160 lb 3.2 oz (72.7 kg)  No data to display            ASSESSMENT AND PLAN:  1.  Paroxysmal atrial fibrillation: Recent monitor confirmed episodes of atrial fibrillation which were symptomatic.  CHA2DS2-VASc score is 1 due to female gender.  Thus, I do not think anticoagulation additionally is needed at this time.  Continue Toprol 25 mg once daily.  She is having 1-2 episodes a month on average.  I discussed with her other management options including an antiarrhythmic medication such as flecainide.  I do not think the burden is high enough to justify ablation.  She wants to continue with Toprol for now and if the episodes become more frequent, she will call us to switch to flecainide. Even though the episodes mostly happen at night, she is usually awake.  She has no significant symptoms of sleep apnea.  2.  Borderline mitral valve prolapse: Echocardiogram last year showed only trivial regurgitation.  3.  Panic attacks and anxiety.  Symptoms improved significantly.   Disposition:   Follow-up in 6 months.  Signed,  Lorine Bears, MD  03/09/2023 8:43 AM    [facility]

## 2023-03-09 NOTE — Patient Instructions (Signed)
Medication Instructions:  No changes *If you need a refill on your cardiac medications before your next appointment, please call your pharmacy*   Lab Work: None ordered If you have labs (blood work) drawn today and your tests are completely normal, you will receive your results only by: MyChart Message (if you have MyChart) OR A paper copy in the mail If you have any lab test that is abnormal or we need to change your treatment, we will call you to review the results.   Testing/Procedures: None ordered   Follow-Up: At [facility], you and your health needs are our priority.  As part of our continuing mission to provide you with exceptional heart care, we have created designated Provider Care Teams.  These Care Teams include your primary Cardiologist (physician) and Advanced Practice Providers (APPs -  Physician Assistants and Nurse Practitioners) who all work together to provide you with the care you need, when you need it.  We recommend signing up for the patient portal called "MyChart".  Sign up information is provided on this After Visit Summary.  MyChart is used to connect with patients for Virtual Visits (Telemedicine).  Patients are able to view lab/test results, encounter notes, upcoming appointments, etc.  Non-urgent messages can be sent to your provider as well.   To learn more about what you can do with MyChart, go to [email or web address].    Your next appointment:   6 month(s)  Provider:   Dr. Arida  

## 2023-03-15 ENCOUNTER — Encounter: Payer: Self-pay | Admitting: Cardiovascular Disease

## 2023-03-15 DIAGNOSIS — I480 Paroxysmal atrial fibrillation: Secondary | ICD-10-CM

## 2023-03-15 DIAGNOSIS — Z139 Encounter for screening, unspecified: Secondary | ICD-10-CM | POA: Diagnosis not present

## 2023-03-15 DIAGNOSIS — Z01419 Encounter for gynecological examination (general) (routine) without abnormal findings: Secondary | ICD-10-CM | POA: Diagnosis not present

## 2023-03-22 ENCOUNTER — Ambulatory Visit (HOSPITAL_COMMUNITY): Payer: BC Managed Care – PPO | Attending: Cardiovascular Disease

## 2023-03-22 DIAGNOSIS — I480 Paroxysmal atrial fibrillation: Secondary | ICD-10-CM | POA: Diagnosis not present

## 2023-03-22 LAB — ECHOCARDIOGRAM COMPLETE
Area-P 1/2: 4.74 cm2
S' Lateral: 2.7 cm

## 2023-06-16 ENCOUNTER — Other Ambulatory Visit: Payer: Self-pay | Admitting: Cardiovascular Disease

## 2023-06-16 DIAGNOSIS — R002 Palpitations: Secondary | ICD-10-CM

## 2023-06-17 NOTE — Telephone Encounter (Signed)
Refill Request.  

## 2023-08-04 ENCOUNTER — Encounter: Payer: Self-pay | Admitting: Cardiovascular Disease

## 2023-08-04 DIAGNOSIS — I480 Paroxysmal atrial fibrillation: Secondary | ICD-10-CM

## 2023-08-11 DIAGNOSIS — Z1231 Encounter for screening mammogram for malignant neoplasm of breast: Secondary | ICD-10-CM | POA: Diagnosis not present

## 2023-08-11 LAB — HM MAMMOGRAPHY

## 2023-08-17 NOTE — Progress Notes (Signed)
Office Visit Note  Patient: Jill Leonard             Date of Birth: [birth year]           MRN: [id number]             PCP: Excell Seltzer, MD Referring: Excell Seltzer, MD Visit Date: 08/26/2023 Occupation: @GUAROCC @  Subjective:  Dry mouth and dry eyes  History of Present Illness: Jill Leonard is a 52 y.o. female with Sjogren's, osteoarthritis and fibromyalgia syndrome.  Patient states has been using over-the-counter products for her dry eyes which is helpful.  She is having more trouble during the winter months because of the heating.  She has not used much products for her mouth.  She has mild dry mouth symptoms.  She decided not to take pilocarpine.  She has not noticed joint swelling.  She has been taking natural anti-inflammatories and has noticed some improvement in the joint pain.  She has intermittent discomfort in the trochanteric region and her hip joints.  Patient states she was recently started on flecainide for atrial fibrillation.  She has to schedule an appointment with Dr. Delton Coombes.  Patient states she has some shortness of breath on exertion.    Activities of Daily Living:  Patient reports morning stiffness for 4 hours.   Patient Reports nocturnal pain.  Difficulty dressing/grooming: Denies Difficulty climbing stairs: Reports Difficulty getting out of chair: Denies Difficulty using hands for taps, buttons, cutlery, and/or writing: Reports  Review of Systems  Constitutional:  Positive for fatigue.  HENT:  Positive for mouth dryness. Negative for mouth sores.   Eyes:  Positive for dryness.  Respiratory:  Positive for shortness of breath.   Cardiovascular:  Positive for palpitations. Negative for chest pain.  Gastrointestinal:  Negative for blood in stool, constipation and diarrhea.  Endocrine: Negative for increased urination.  Genitourinary:  Negative for involuntary urination.  Musculoskeletal:  Positive for joint pain, joint pain, myalgias, muscle weakness,  morning stiffness, muscle tenderness and myalgias. Negative for gait problem and joint swelling.  Skin:  Positive for rash, hair loss and sensitivity to sunlight. Negative for color change.  Allergic/Immunologic: Negative for susceptible to infections.  Neurological:  Positive for dizziness and headaches.  Hematological:  Negative for swollen glands.  Psychiatric/Behavioral:  Positive for depressed mood and sleep disturbance. The patient is nervous/anxious.     PMFS History:  Patient Active Problem List   Diagnosis Date Noted   Paroxysmal atrial fibrillation (HCC) 08/18/2023   Hypercholesterolemia 06/17/2022   Vitamin D deficiency 06/17/2022   Multiple thyroid nodules 01/01/2022   Solitary pulmonary nodule 11/06/2019   Subjective tinnitus of both ears 06/06/2018   Mixed conductive and sensorineural hearing loss of right ear with unrestricted hearing of left ear 06/06/2018   MDD (major depressive disorder), recurrent episode (HCC) 01/09/2018   Fibromyalgia 01/09/2018   Sjogren's disease (HCC) 01/09/2018   Generalized anxiety disorder 03/18/2016   SVT (supraventricular tachycardia) 03/05/2015   GERD 06/21/2008    Past Medical History:  Diagnosis Date   Anal fissure    Arthralgia of temporomandibular joint    MIGRAINES AND REGULAR   Atrial fibrillation (HCC)    per patient   Calculus of gallbladder without mention of cholecystitis or obstruction    Complication of anesthesia    Depression    Dysrhythmia    RAPID HEARTBEAT AND SKIPPED BEATS ON METOPROLOL   Family history of adverse reaction to anesthesia    FAMILY MEMBERS HAVE  PONV   Fibromyalgia    GERD (gastroesophageal reflux disease)    Heart murmur    IN PAST, TOLD IS GONE NOW   History of kidney stones    History of nephrolithiasis    HLD (hyperlipidemia)    PONV (postoperative nausea and vomiting)    Raynaud's disease    per patient   Sjogren - Larsson's syndrome    Sjogren's syndrome (HCC)     Family History   Problem Relation Age of Onset   Stroke Mother    Heart Problems Mother    Kidney failure Mother    Colon polyps Father    Bone cancer Other        Grandmother   Diabetes Other        Grandmother   Heart disease Other        Grandmother   Healthy Son    Past Surgical History:  Procedure Laterality Date   ABDOMINAL HYSTERECTOMY     CHOLECYSTECTOMY  08/20/08   MANDIBLE SURGERY     VENTRAL HERNIA REPAIR N/A 06/12/2019   Procedure: VENTRAL HERNIA REPAIR WITH MESH;  Surgeon: Griselda Miner, MD;  Location: [facility] OR;  Service: General;  Laterality: N/A;   WISDOM TOOTH EXTRACTION     Social History   Social History Narrative   Married      Caffeine: 2 cups/day      No regular exercise          There is no immunization history on file for this patient.   Objective: Vital Signs: BP 126/80 (BP Location: Left Arm, Patient Position: Sitting, Cuff Size: Normal)   Pulse (!) 58   Resp 15   Ht 5\' 7"  (1.702 m)   Wt 164 lb (74.4 kg)   LMP 07/03/2018   BMI 25.69 kg/m    Physical Exam Vitals and nursing note reviewed.  Constitutional:      Appearance: She is well-developed.  HENT:     Head: Normocephalic and atraumatic.  Eyes:     Conjunctiva/sclera: Conjunctivae normal.  Cardiovascular:     Rate and Rhythm: Normal rate and regular rhythm.     Heart sounds: Normal heart sounds.  Pulmonary:     Effort: Pulmonary effort is normal.     Breath sounds: Normal breath sounds.  Abdominal:     General: Bowel sounds are normal.     Palpations: Abdomen is soft.  Musculoskeletal:     Cervical back: Normal range of motion.  Lymphadenopathy:     Cervical: No cervical adenopathy.  Skin:    General: Skin is warm and dry.     Capillary Refill: Capillary refill takes less than 2 seconds.  Neurological:     Mental Status: She is alert and oriented to person, place, and time.  Psychiatric:        Behavior: Behavior normal.      Musculoskeletal Exam: Cervical, thoracic and lumbar spine  1 good range of motion.  Shoulders, elbows, wrist joints, MCPs PIPs and DIPs Juengel range of motion with no synovitis.  Bilateral PIP and DIP thickening was noted.  Hip joints were in good range of motion.  She had mild tenderness over trochanteric region.  There was no tenderness over knees, ankles or MTPs.  CDAI Exam: CDAI Score: -- Patient Global: --; Provider Global: -- Swollen: --; Tender: -- Joint Exam 08/26/2023   No joint exam has been documented for this visit   There is currently no information documented on the homunculus. Go  to the Rheumatology activity and complete the homunculus joint exam.  Investigation: No additional findings.  Imaging: No results found.  Recent Labs: Lab Results  Component Value Date   WBC 4.8 02/02/2023   HGB 14.4 02/02/2023   PLT 258 02/02/2023   NA 140 02/02/2023   K 3.8 02/02/2023   CL 104 02/02/2023   CO2 28 02/02/2023   GLUCOSE 77 02/02/2023   BUN 13 02/02/2023   CREATININE 0.80 02/02/2023   BILITOT 0.4 02/02/2023   ALKPHOS 61 06/10/2022   AST 30 02/02/2023   ALT 21 02/02/2023   PROT 8.6 (H) 02/02/2023   PROT 8.8 (H) 02/02/2023   ALBUMIN 4.4 06/10/2022   CALCIUM 9.4 02/02/2023   GFRAA >60 05/21/2020    Speciality Comments: Plaquenil x 3 years-frequent infections  Procedures:  No procedures performed Allergies: Hydrocodone-guaifenesin, Sulfonamide derivatives, Trazodone and nefazodone, and Latex   Assessment / Plan:     Visit Diagnoses: Sjogren's syndrome with other organ involvement (HCC) - Positive ANA, positive Ro, positive RNP , sicca dxd by Dr. Leida Lauth 20 years ago.ttd by Dr. Dierdre Forth and Dr. Kathi Ludwig. -Patient continues to have dry mouth and dry eye symptoms.  She states the symptoms are manageable with over-the-counter products.  She does not need to take pilocarpine.  Over-the-counter products were reviewed.  I will recheck labs again today.  Plan: CBC with Differential/Platelet, COMPLETE METABOLIC PANEL WITH GFR,  Urinalysis, Routine w reflex microscopic, ANA, Anti-DNA antibody, double-stranded, C3 and C4, Sedimentation rate, Sjogrens syndrome-A extractable nuclear antibody, RNP Antibody  Primary osteoarthritis of both hands -she continues to have some stiffness in her hands.  No synovitis was noted.  She had bilateral PIP and DIP thickening consistent with osteoarthritis.  Joint protection was discussed.  Trochanteric bursitis of both hips-she can history of some IT band discomfort especially when she walks.  She has a handout on exercises.  She was encouraged to do exercise on a regular basis.  Fibromyalgia-she has some generalized pain and discomfort.  Paroxysmal atrial fibrillation-recent diagnosis.  Patient was started on flecainide.  She has noted improvement in her symptoms.  Hypercholesterolemia  Shortness of breath-she continues to have some shortness of breath.  Lungs were clear to auscultation.  She was advised to schedule a follow-up appointment with Dr. Delton Coombes.  PFTs in the past were unremarkable and CT scan of the chest was stable.  Solitary pulmonary nodule - She was evaluated by Dr. Delton Coombes.  CT scan June 2023.  Patient gives history of shortness of breath only on exertion.  January 2022 PFTs were normal.  Multiple thyroid nodules  Gastroesophageal reflux disease without esophagitis  Anxiety and depression  Subjective tinnitus of both ears  Mixed conductive and sensorineural hearing loss of right ear with unrestricted hearing of left ear  Vitamin D deficiency  Orders: Orders Placed This Encounter  Procedures   CBC with Differential/Platelet   COMPLETE METABOLIC PANEL WITH GFR   Urinalysis, Routine w reflex microscopic   ANA   Anti-DNA antibody, double-stranded   C3 and C4   Sedimentation rate   Sjogrens syndrome-A extractable nuclear antibody   RNP Antibody   No orders of the defined types were placed in this encounter.    Follow-Up Instructions: Return for Sjogren's,  Osteoarthritis.   Pollyann Savoy, MD  Note - This record has been created using Animal nutritionist.  Chart creation errors have been sought, but may not always  have been located. Such creation errors do not reflect on  the standard of  medical care.

## 2023-08-18 ENCOUNTER — Ambulatory Visit (HOSPITAL_COMMUNITY)
Admission: RE | Admit: 2023-08-18 | Discharge: 2023-08-18 | Disposition: A | Discharge status: Home / Self Care | Payer: BC Managed Care – PPO | Source: Ambulatory Visit | Attending: Internal Medicine | Admitting: Internal Medicine

## 2023-08-18 VITALS — BP 120/74 | HR 68 | Ht 67.0 in | Wt 164.4 lb

## 2023-08-18 DIAGNOSIS — M3500 Sicca syndrome, unspecified: Secondary | ICD-10-CM | POA: Diagnosis not present

## 2023-08-18 DIAGNOSIS — I480 Paroxysmal atrial fibrillation: Secondary | ICD-10-CM | POA: Diagnosis not present

## 2023-08-18 DIAGNOSIS — E785 Hyperlipidemia, unspecified: Secondary | ICD-10-CM | POA: Insufficient documentation

## 2023-08-18 DIAGNOSIS — I4891 Unspecified atrial fibrillation: Secondary | ICD-10-CM | POA: Diagnosis not present

## 2023-08-18 DIAGNOSIS — Z79899 Other long term (current) drug therapy: Secondary | ICD-10-CM | POA: Diagnosis not present

## 2023-08-18 MED ORDER — FLECAINIDE ACETATE 50 MG PO TABS: 50.0000 mg | ORAL_TABLET | Freq: Two times a day (BID) | ORAL | 3 refills | Status: DC

## 2023-08-18 NOTE — Progress Notes (Addendum)
Primary Care Physician: Excell Seltzer, MD Primary Cardiologist: Lorine Bears, MD Electrophysiologist: None     Referring Physician: Dr. Diamantina Monks is a 52 y.o. female with a history of Sjogren's syndrome, HLD, borderline MVP, and paroxysmal atrial fibrillation who presents for consultation in the [facility]. She contacted office noting increased Afib burden recently. She currently takes Toprol 25 mg daily. Patient is not on anticoagulation. She has a CHADS2VASC score of 1.  On evaluation today, she is currently in NSR. She has noted increased frequency of Afib in November and is interested in treatment for this. She has also noted increased frequency of tachycardia episodes as well. They typically occur at night and are very uncomfortable.   Today, she denies symptoms of chest pain, shortness of breath, orthopnea, PND, lower extremity edema, dizziness, presyncope, syncope, snoring, daytime somnolence, bleeding, or neurologic sequela. The patient is tolerating medications without difficulties and is otherwise without complaint today.    Atrial Fibrillation Risk Factors:  she does not have symptoms or diagnosis of sleep apnea.   she has a BMI of Body mass index is 25.75 kg/m.Marland Kitchen Filed Weights   08/18/23 1517  Weight: 74.6 kg    Current Outpatient Medications  Medication Sig Dispense Refill   acetaminophen (TYLENOL) 500 MG tablet Take 1,000 mg by mouth as needed for moderate pain (pain score 4-6) or headache.     aspirin-acetaminophen-caffeine (EXCEDRIN MIGRAINE) 250-250-65 MG tablet Take 1 tablet by mouth as needed for headache or migraine.     BIOTIN PO Take 1 tablet by mouth in the morning. Biotin 10,000 mcg + Calcium     calcium carbonate (TUMS - DOSED IN MG ELEMENTAL CALCIUM) 500 MG chewable tablet Chew 500 mg by mouth as needed for indigestion or heartburn.     Carboxymethylcellul-Glycerin (LUBRICATING EYE DROPS OP) Place 1 drop  into both eyes daily as needed (dry eyes).     cholecalciferol (VITAMIN D) 1000 UNITS tablet Take 1,000 Units by mouth every evening.     diphenhydramine-acetaminophen (TYLENOL PM) 25-500 MG TABS tablet Take 1 tablet by mouth at bedtime as needed (sleep).      escitalopram (LEXAPRO) 20 MG tablet TAKE 1 TABLET BY MOUTH  DAILY (Patient taking differently: Take 10 mg by mouth daily.) 90 tablet 3   flecainide (TAMBOCOR) 50 MG tablet Take 1 tablet (50 mg total) by mouth 2 (two) times daily. 60 tablet 3   fluticasone (FLONASE) 50 MCG/ACT nasal spray Place 1 spray into both nostrils daily.     metoprolol succinate (TOPROL-XL) 25 MG 24 hr tablet TAKE 1 TABLET BY MOUTH DAILY 90 tablet 2   pantoprazole (PROTONIX) 40 MG tablet TAKE 1 TABLET BY MOUTH DAILY 90 tablet 3   TART CHERRY PO Take 500 mg by mouth daily.     Turmeric (QC TUMERIC COMPLEX PO) Take 1 tablet by mouth 2 (two) times daily. Tumeric ginger complex - 6000 mcg daily     No current facility-administered medications for this encounter.    Atrial Fibrillation Management history:  Previous antiarrhythmic drugs: none Previous cardioversions: none Previous ablations: none Anticoagulation history: none   ROS- All systems are reviewed and negative except as per the HPI above.  Physical Exam: BP 120/74   Pulse 68   Ht 5\' 7"  (1.702 m)   Wt 74.6 kg   LMP 07/03/2018   BMI 25.75 kg/m   GEN: Well nourished, well developed in no acute distress  NECK: No JVD; No carotid bruits CARDIAC: Regular rate and rhythm, no murmurs, rubs, gallops RESPIRATORY:  Clear to auscultation without rales, wheezing or rhonchi  ABDOMEN: Soft, non-tender, non-distended EXTREMITIES:  No edema; No deformity   EKG today demonstrates  Vent. rate 68 BPM PR interval 154 ms QRS duration 68 ms QT/QTcB 390/414 ms P-R-T axes 84 80 70 Normal sinus rhythm Low voltage QRS Septal infarct , age undetermined Abnormal ECG When compared with ECG of 09-Mar-2023  08:53, PREVIOUS ECG IS PRESENT  Echo 03/22/23 demonstrated  1. Left ventricular ejection fraction, by estimation, is 65 to 70%. The  left ventricle has normal function. The left ventricle has no regional  wall motion abnormalities. Left ventricular diastolic parameters were  normal. The average left ventricular  global longitudinal strain is -21.5 %. The global longitudinal strain is  normal.   2. Right ventricular systolic function is normal. The right ventricular  size is normal.   3. The mitral valve is normal in structure. Trivial mitral valve  regurgitation. No evidence of mitral stenosis. There is mild prolapse of  anterior leaflet of the mitral valve.   4. The aortic valve is tricuspid. Aortic valve regurgitation is not  visualized. No aortic stenosis is present.   5. The inferior vena cava is normal in size with greater than 50%  respiratory variability, suggesting right atrial pressure of 3 mmHg.   Cardiac monitor 10-07/2022: Patch Wear Time:  13 days and 23 hours (2023-10-24T09:19:15-0400 to 2023-11-07T07:45:52-0500)   Patient had a min HR of 46 bpm, max HR of 164 bpm, and avg HR of 68 bpm. Predominant underlying rhythm was Sinus Rhythm. First Degree AV Block was present.  1 run of Supraventricular Tachycardia occurred lasting 9 beats with a max rate of 164 bpm (avg 125 bpm). Atrial Fibrillation occurred (1% burden), ranging from 59-160 bpm (avg of 96 bpm), the longest lasting 3 hours 17 mins with an avg rate of 87 bpm. Supraventricular Tachycardia and Atrial Fibrillation were detected within +/- 45 seconds of symptomatic patient event(s).  Rare PACs and rare PVCs.   ASSESSMENT & PLAN CHA2DS2-VASc Score = 1  The patient's score is based upon: CHF History: 0 HTN History: 0 Diabetes History: 0 Stroke History: 0 Vascular Disease History: 0 Age Score: 0 Gender Score: 1       ASSESSMENT AND PLAN: Paroxysmal Atrial Fibrillation (ICD10:  I48.0) The patient's CHA2DS2-VASc  score is 1, indicating a 0.6% annual risk of stroke.    She is currently in NSR. We discussed options for rate control and rhythm control of increased frequency of Afib episodes. After discussion, patient wishes to proceed with rhythm control. Previous discussion with her cardiologist mentioned flecainide as a possibility, especially over ablation given low burden historically. We did discuss ablation as an option in the future if indicated. She would like to proceed with flecainide; we will begin 50 mg BID and see her back in 7-10 days for repeat ECG. She is on Lexapro which is a class C level interaction so will assess her ECG next week carefully for changes. Will plan to schedule treadmill stress test after ECG recheck.  Continue Toprol 25 mg once daily. Check BP to confirm no hypotension.     Repeat ECG 7-10 days.    Lake Bells, PA-C  [facility] [address] [place], [place] [address] [phone]

## 2023-08-18 NOTE — Patient Instructions (Addendum)
Start Flecainide 50mg twice a day 

## 2023-08-25 ENCOUNTER — Ambulatory Visit (HOSPITAL_COMMUNITY)
Admission: RE | Admit: 2023-08-25 | Discharge: 2023-08-25 | Disposition: A | Discharge status: Home / Self Care | Payer: BC Managed Care – PPO | Source: Ambulatory Visit | Attending: Internal Medicine | Admitting: Internal Medicine

## 2023-08-25 VITALS — HR 59

## 2023-08-25 DIAGNOSIS — R001 Bradycardia, unspecified: Secondary | ICD-10-CM | POA: Insufficient documentation

## 2023-08-25 DIAGNOSIS — I4891 Unspecified atrial fibrillation: Secondary | ICD-10-CM | POA: Insufficient documentation

## 2023-08-25 DIAGNOSIS — Z79899 Other long term (current) drug therapy: Secondary | ICD-10-CM | POA: Diagnosis not present

## 2023-08-25 NOTE — Progress Notes (Signed)
Patient seen on 12/4 and decided to begin flecainide 50 mg BID for rhythm control.    ECG today shows  Vent. rate 59 BPM PR interval 170 ms QRS duration 72 ms QT/QTcB 410/405 ms P-R-T axes 75 70 69 Sinus bradycardia Low voltage QRS Septal infarct , age undetermined Abnormal ECG When compared with ECG of 18-Aug-2023 15:36, PREVIOUS ECG IS PRESENT   Will schedule treadmill stress test. Pt will follow up in 3 months.

## 2023-08-26 ENCOUNTER — Encounter: Payer: Self-pay | Admitting: Rheumatology

## 2023-08-26 ENCOUNTER — Ambulatory Visit: Payer: BC Managed Care – PPO | Attending: Rheumatology | Admitting: Rheumatology

## 2023-08-26 VITALS — BP 126/80 | HR 58 | Resp 15 | Ht 67.0 in | Wt 164.0 lb

## 2023-08-26 DIAGNOSIS — E7800 Pure hypercholesterolemia, unspecified: Secondary | ICD-10-CM

## 2023-08-26 DIAGNOSIS — H9071 Mixed conductive and sensorineural hearing loss, unilateral, right ear, with unrestricted hearing on the contralateral side: Secondary | ICD-10-CM

## 2023-08-26 DIAGNOSIS — I480 Paroxysmal atrial fibrillation: Secondary | ICD-10-CM

## 2023-08-26 DIAGNOSIS — M19042 Primary osteoarthritis, left hand: Secondary | ICD-10-CM

## 2023-08-26 DIAGNOSIS — R0602 Shortness of breath: Secondary | ICD-10-CM

## 2023-08-26 DIAGNOSIS — M7061 Trochanteric bursitis, right hip: Secondary | ICD-10-CM

## 2023-08-26 DIAGNOSIS — K219 Gastro-esophageal reflux disease without esophagitis: Secondary | ICD-10-CM

## 2023-08-26 DIAGNOSIS — H9313 Tinnitus, bilateral: Secondary | ICD-10-CM

## 2023-08-26 DIAGNOSIS — M797 Fibromyalgia: Secondary | ICD-10-CM | POA: Diagnosis not present

## 2023-08-26 DIAGNOSIS — M19041 Primary osteoarthritis, right hand: Secondary | ICD-10-CM

## 2023-08-26 DIAGNOSIS — M7062 Trochanteric bursitis, left hip: Secondary | ICD-10-CM

## 2023-08-26 DIAGNOSIS — M3509 Sicca syndrome with other organ involvement: Secondary | ICD-10-CM | POA: Diagnosis not present

## 2023-08-26 DIAGNOSIS — I471 Supraventricular tachycardia, unspecified: Secondary | ICD-10-CM

## 2023-08-26 DIAGNOSIS — R911 Solitary pulmonary nodule: Secondary | ICD-10-CM

## 2023-08-26 DIAGNOSIS — F32A Depression, unspecified: Secondary | ICD-10-CM

## 2023-08-26 DIAGNOSIS — E559 Vitamin D deficiency, unspecified: Secondary | ICD-10-CM

## 2023-08-26 DIAGNOSIS — E042 Nontoxic multinodular goiter: Secondary | ICD-10-CM

## 2023-08-26 DIAGNOSIS — F419 Anxiety disorder, unspecified: Secondary | ICD-10-CM

## 2023-08-30 ENCOUNTER — Other Ambulatory Visit (HOSPITAL_COMMUNITY): Payer: Self-pay | Admitting: *Deleted

## 2023-08-30 DIAGNOSIS — H35373 Puckering of macula, bilateral: Secondary | ICD-10-CM | POA: Diagnosis not present

## 2023-08-30 DIAGNOSIS — I4891 Unspecified atrial fibrillation: Secondary | ICD-10-CM

## 2023-09-01 LAB — URINALYSIS, ROUTINE W REFLEX MICROSCOPIC
Bilirubin Urine: NEGATIVE
Glucose, UA: NEGATIVE
Hgb urine dipstick: NEGATIVE
Ketones, ur: NEGATIVE
Leukocytes,Ua: NEGATIVE
Nitrite: NEGATIVE
Protein, ur: NEGATIVE
Specific Gravity, Urine: 1.004 (ref 1.001–1.035)
pH: 6.5 (ref 5.0–8.0)

## 2023-09-01 LAB — CBC WITH DIFFERENTIAL/PLATELET
Absolute Lymphocytes: 1247 {cells}/uL (ref 850–3900)
Absolute Monocytes: 395 {cells}/uL (ref 200–950)
Basophils Absolute: 42 {cells}/uL (ref 0–200)
Basophils Relative: 1 %
Eosinophils Absolute: 172 {cells}/uL (ref 15–500)
Eosinophils Relative: 4.1 %
HCT: 44.6 % (ref 35.0–45.0)
Hemoglobin: 14.3 g/dL (ref 11.7–15.5)
MCH: 27.7 pg (ref 27.0–33.0)
MCHC: 32.1 g/dL (ref 32.0–36.0)
MCV: 86.3 fL (ref 80.0–100.0)
MPV: 10.6 fL (ref 7.5–12.5)
Monocytes Relative: 9.4 %
Neutro Abs: 2344 {cells}/uL (ref 1500–7800)
Neutrophils Relative %: 55.8 %
Platelets: 288 10*3/uL (ref 140–400)
RBC: 5.17 10*6/uL — ABNORMAL HIGH (ref 3.80–5.10)
RDW: 12.7 % (ref 11.0–15.0)
Total Lymphocyte: 29.7 %
WBC: 4.2 10*3/uL (ref 3.8–10.8)

## 2023-09-01 LAB — COMPLETE METABOLIC PANEL WITH GFR
AG Ratio: 1.3 (calc) (ref 1.0–2.5)
ALT: 23 U/L (ref 6–29)
AST: 28 U/L (ref 10–35)
Albumin: 4.5 g/dL (ref 3.6–5.1)
Alkaline phosphatase (APISO): 71 U/L (ref 37–153)
BUN: 13 mg/dL (ref 7–25)
CO2: 31 mmol/L (ref 20–32)
Calcium: 9.8 mg/dL (ref 8.6–10.4)
Chloride: 103 mmol/L (ref 98–110)
Creat: 0.78 mg/dL (ref 0.50–1.03)
Globulin: 3.6 g/dL (ref 1.9–3.7)
Glucose, Bld: 71 mg/dL (ref 65–99)
Potassium: 3.8 mmol/L (ref 3.5–5.3)
Sodium: 140 mmol/L (ref 135–146)
Total Bilirubin: 0.4 mg/dL (ref 0.2–1.2)
Total Protein: 8.1 g/dL (ref 6.1–8.1)
eGFR: 91 mL/min/{1.73_m2} (ref >=60)

## 2023-09-01 LAB — ANTI-NUCLEAR AB-TITER (ANA TITER): ANA Titer 1: 1:1280 {titer} — ABNORMAL HIGH

## 2023-09-01 LAB — SEDIMENTATION RATE: Sed Rate: 33 mm/h — ABNORMAL HIGH (ref 0–30)

## 2023-09-01 LAB — C3 AND C4
C3 Complement: 131 mg/dL (ref 83–193)
C4 Complement: 21 mg/dL (ref 15–57)

## 2023-09-01 LAB — SJOGRENS SYNDROME-A EXTRACTABLE NUCLEAR ANTIBODY: SSA (Ro) (ENA) Antibody, IgG: >8 AI — AB

## 2023-09-01 LAB — RNP ANTIBODY: Ribonucleic Protein(ENA) Antibody, IgG: 1.1 AI — AB

## 2023-09-01 LAB — ANA: Anti Nuclear Antibody (ANA): POSITIVE — AB

## 2023-09-01 LAB — ANTI-DNA ANTIBODY, DOUBLE-STRANDED: ds DNA Ab: <1 [IU]/mL

## 2023-09-02 ENCOUNTER — Encounter (HOSPITAL_COMMUNITY): Payer: Self-pay

## 2023-09-02 NOTE — Progress Notes (Signed)
Sedimentation rate is mildly elevated and stable.  SSA, RNP and ANA remain positive in stable titers.  CBC, CMP, and UA are within normal limits.  Complements are normal.  Double-stranded DNA negative.  No change in treatment advised.

## 2023-09-09 ENCOUNTER — Ambulatory Visit (HOSPITAL_COMMUNITY): Payer: BC Managed Care – PPO

## 2023-09-09 ENCOUNTER — Other Ambulatory Visit: Payer: Self-pay | Admitting: Family Medicine

## 2023-09-10 NOTE — Telephone Encounter (Signed)
She still has to see Dr. Ermalene Searing once a year in order to continue getting refills from provider.  Please call and schedule medication refill with fasting labs prior or she can see if her GYN will refill her medications. Marland Kitchen

## 2023-09-10 NOTE — Telephone Encounter (Signed)
Spoke to pt. Pt states she's already had cpe earlier this year, with her Gynecologist. Pt states her insurance won't cover two cpe's in one yr. Pt states she's been on the meds for years & wishes they continued to get refilled by Dr. Ermalene Searing.

## 2023-09-10 NOTE — Telephone Encounter (Signed)
Please schedule CPE with fasting labs prior with Dr. Ermalene Searing.  Please send back to me once scheduled to refill medication.

## 2023-09-10 NOTE — Telephone Encounter (Signed)
Spoke to pt, scheduled ov for 09/29/23

## 2023-09-17 ENCOUNTER — Telehealth (HOSPITAL_COMMUNITY): Payer: Self-pay | Admitting: *Deleted

## 2023-09-17 NOTE — Telephone Encounter (Signed)
 Reminder call to patient for upcoming stress test on 09/20/23 for ETT - initiation of flecanide.

## 2023-09-20 ENCOUNTER — Ambulatory Visit (HOSPITAL_COMMUNITY): Payer: BC Managed Care – PPO | Attending: Internal Medicine

## 2023-09-20 DIAGNOSIS — I4891 Unspecified atrial fibrillation: Secondary | ICD-10-CM | POA: Insufficient documentation

## 2023-09-20 LAB — EXERCISE TOLERANCE TEST
Angina Index: 0
Duke Treadmill Score: 9
Estimated workload: 10.1
Exercise duration (min): 9 min
Exercise duration (sec): 0 s
MPHR: 168 {beats}/min
Peak HR: 142 {beats}/min
Percent HR: 85 %
RPE: 18
Rest HR: 71 {beats}/min
ST Depression (mm): 0 mm

## 2023-09-27 ENCOUNTER — Ambulatory Visit: Payer: BC Managed Care – PPO | Admitting: Internal Medicine

## 2023-09-27 ENCOUNTER — Encounter: Payer: Self-pay | Admitting: Internal Medicine

## 2023-09-27 VITALS — BP 110/84 | HR 80 | Temp 98.4°F | Resp 96 | Ht 67.0 in | Wt 165.0 lb

## 2023-09-27 DIAGNOSIS — J22 Unspecified acute lower respiratory infection: Secondary | ICD-10-CM | POA: Insufficient documentation

## 2023-09-27 MED ORDER — DOXYCYCLINE HYCLATE 100 MG PO TABS: 100.0000 mg | ORAL_TABLET | Freq: Two times a day (BID) | ORAL | 0 refills | Status: DC

## 2023-09-27 NOTE — Assessment & Plan Note (Signed)
 Viral vs the atypical that has been going around Discussed supportive care---analgesics, dextromethorphan Will give doxy 100 bid x 7 days in case atypical

## 2023-09-27 NOTE — Progress Notes (Signed)
 Subjective:    Patient ID: Jill Leonard, female    DOB: 1970-12-26, 53 y.o.   MRN: [id number]  HPI Here due to respiratory illness  Cycling respiratory infections in family over the past 6 weeks or so Had infection about a month ago and improved Started with new infection 3 days ago Increasing cough Low grade fever the past 2 days Cough is worse---can hear noise if lying on side--?wheezing  Has pain in back of both ears--?gland --for 2 weeks Cough is mostly dry No SOB Some posterior headache No sore throat Not really with pain inside ears Some post nasal drip and nasal drainage  Has tried tylenol , dayquil---not clearly helpful Excedrin for headache  Didn't test for COVID  Current Outpatient Medications on File Prior to Visit  Medication Sig Dispense Refill   acetaminophen  (TYLENOL ) 500 MG tablet Take 1,000 mg by mouth as needed for moderate pain (pain score 4-6) or headache.     aspirin-acetaminophen -caffeine  (EXCEDRIN MIGRAINE) 250-250-65 MG tablet Take 1 tablet by mouth as needed for headache or migraine.     BIOTIN PO Take 1 tablet by mouth in the morning. Biotin 10,000 mcg + Calcium      calcium  carbonate (TUMS - DOSED IN MG ELEMENTAL CALCIUM ) 500 MG chewable tablet Chew 500 mg by mouth as needed for indigestion or heartburn.     Carboxymethylcellul-Glycerin (LUBRICATING EYE DROPS OP) Place 1 drop into both eyes daily as needed (dry eyes).     cholecalciferol (VITAMIN D ) 1000 UNITS tablet Take 1,000 Units by mouth every evening.     diphenhydramine-acetaminophen  (TYLENOL  PM) 25-500 MG TABS tablet Take 1 tablet by mouth at bedtime as needed (sleep).      escitalopram  (LEXAPRO ) 20 MG tablet TAKE 1 TABLET BY MOUTH  DAILY (Patient taking differently: Take 10 mg by mouth daily.) 90 tablet 3   flecainide  (TAMBOCOR ) 50 MG tablet Take 1 tablet (50 mg total) by mouth 2 (two) times daily. 60 tablet 3   fluticasone (FLONASE) 50 MCG/ACT nasal spray Place 1 spray into both nostrils  daily.     metoprolol  succinate (TOPROL -XL) 25 MG 24 hr tablet TAKE 1 TABLET BY MOUTH DAILY 90 tablet 2   pantoprazole  (PROTONIX ) 40 MG tablet TAKE 1 TABLET BY MOUTH DAILY 90 tablet 3   TART CHERRY PO Take 500 mg by mouth daily.     Turmeric (QC TUMERIC COMPLEX PO) Take 1 tablet by mouth 2 (two) times daily. Tumeric ginger complex - 6000 mcg daily     No current facility-administered medications on file prior to visit.    Allergies  Allergen Reactions   Hydrocodone -Guaifenesin  Nausea And Vomiting   Sulfonamide Derivatives Nausea And Vomiting     I could not function   Trazodone  And Nefazodone Other (See Comments)    Jittery and anxious and difficulty sleeping.   Latex Rash and Other (See Comments)    Blisters    Past Medical History:  Diagnosis Date   Anal fissure    Arthralgia of temporomandibular joint    MIGRAINES AND REGULAR   Atrial fibrillation (HCC)    per patient   Calculus of gallbladder without mention of cholecystitis or obstruction    Complication of anesthesia    Depression    Dysrhythmia    RAPID HEARTBEAT AND SKIPPED BEATS ON METOPROLOL    Family history of adverse reaction to anesthesia    FAMILY MEMBERS HAVE PONV   Fibromyalgia    GERD (gastroesophageal reflux disease)    Heart murmur  IN PAST, TOLD IS GONE NOW   History of kidney stones    History of nephrolithiasis    HLD (hyperlipidemia)    PONV (postoperative nausea and vomiting)    Raynaud's disease    per patient   Sjogren - Larsson's syndrome    Sjogren's syndrome (HCC)     Past Surgical History:  Procedure Laterality Date   ABDOMINAL HYSTERECTOMY     CHOLECYSTECTOMY  08/20/08   MANDIBLE SURGERY     VENTRAL HERNIA REPAIR N/A 06/12/2019   Procedure: VENTRAL HERNIA REPAIR WITH MESH;  Surgeon: Curvin Deward MOULD, MD;  Location: [facility] OR;  Service: General;  Laterality: N/A;   WISDOM TOOTH EXTRACTION      Family History  Problem Relation Age of Onset   Stroke Mother    Heart Problems Mother     Kidney failure Mother    Colon polyps Father    Bone cancer Other        Grandmother   Diabetes Other        Grandmother   Heart disease Other        Grandmother   Healthy Son     Social History   Socioeconomic History   Marital status: Married    Spouse name: Not on file   Number of children: Not on file   Years of education: Not on file   Highest education level: Not on file  Occupational History   Not on file  Tobacco Use   Smoking status: Never    Passive exposure: Past   Smokeless tobacco: Never  Vaping Use   Vaping status: Never Used  Substance and Sexual Activity   Alcohol use: Yes    Alcohol/week: 0.0 standard drinks of alcohol    Comment: Socially-rare   Drug use: No   Sexual activity: Not on file  Other Topics Concern   Not on file  Social History Narrative   Married      Caffeine : 2 cups/day      No regular exercise         Social Drivers of Corporate Investment Banker Strain: Not on file  Food Insecurity: Not on file  Transportation Needs: Not on file  Physical Activity: Not on file  Stress: Not on file  Social Connections: Not on file  Intimate Partner Violence: Not on file   Review of Systems No loss of smell. Taste slightly off No N/V Is able to eat     Objective:   Physical Exam Constitutional:      Appearance: Normal appearance.     Comments: Coarse cough  HENT:     Head:     Comments: No sinus tenderness Mild tenderness L>R postauricular/occipital area---no clear nodes    Right Ear: Tympanic membrane and ear canal normal.     Left Ear: Tympanic membrane and ear canal normal.     Mouth/Throat:     Pharynx: No oropharyngeal exudate or posterior oropharyngeal erythema.  Pulmonary:     Effort: Pulmonary effort is normal.     Breath sounds: Normal breath sounds. No wheezing or rales.     Comments: Describes expiratory ?rhonchi--when supine Musculoskeletal:     Cervical back: Neck supple.  Lymphadenopathy:     Cervical: No  cervical adenopathy.  Neurological:     Mental Status: She is alert.            Assessment & Plan:

## 2023-09-29 ENCOUNTER — Other Ambulatory Visit (HOSPITAL_COMMUNITY): Payer: Self-pay | Admitting: *Deleted

## 2023-09-29 ENCOUNTER — Ambulatory Visit: Payer: BC Managed Care – PPO | Admitting: Family Medicine

## 2023-09-29 MED ORDER — FLECAINIDE ACETATE 50 MG PO TABS: 50.0000 mg | ORAL_TABLET | Freq: Two times a day (BID) | ORAL | 2 refills | Status: DC

## 2023-10-05 ENCOUNTER — Encounter: Payer: Self-pay | Admitting: Family Medicine

## 2023-10-05 DIAGNOSIS — F32A Depression, unspecified: Secondary | ICD-10-CM

## 2023-10-05 DIAGNOSIS — K219 Gastro-esophageal reflux disease without esophagitis: Secondary | ICD-10-CM

## 2023-10-05 MED ORDER — ESCITALOPRAM OXALATE 20 MG PO TABS
20.0000 mg | ORAL_TABLET | Freq: Every day | ORAL | 0 refills | Status: DC
Start: 2023-10-05 — End: 2023-12-14

## 2023-10-05 MED ORDER — PANTOPRAZOLE SODIUM 40 MG PO TBEC: 40.0000 mg | DELAYED_RELEASE_TABLET | Freq: Every day | ORAL | 0 refills | Status: DC

## 2023-10-05 NOTE — Telephone Encounter (Signed)
Please call and schedule CPE with fasting labs prior with Dr. Bedsole. 

## 2023-10-15 ENCOUNTER — Ambulatory Visit: Payer: Self-pay | Admitting: Family Medicine

## 2023-10-15 ENCOUNTER — Ambulatory Visit: Payer: BC Managed Care – PPO | Admitting: Family Medicine

## 2023-10-15 ENCOUNTER — Encounter: Payer: Self-pay | Admitting: Family Medicine

## 2023-10-15 VITALS — BP 96/60 | HR 61 | Temp 100.6°F | Ht 67.0 in | Wt 164.5 lb

## 2023-10-15 DIAGNOSIS — J101 Influenza due to other identified influenza virus with other respiratory manifestations: Secondary | ICD-10-CM | POA: Diagnosis not present

## 2023-10-15 DIAGNOSIS — R051 Acute cough: Secondary | ICD-10-CM

## 2023-10-15 LAB — POC INFLUENZA A&B (BINAX/QUICKVUE)
Influenza A, POC: POSITIVE — AB
Influenza B, POC: NEGATIVE

## 2023-10-15 LAB — POC COVID19 BINAXNOW: SARS Coronavirus 2 Ag: NEGATIVE

## 2023-10-15 MED ORDER — OSELTAMIVIR PHOSPHATE 75 MG PO CAPS: 75.0000 mg | ORAL_CAPSULE | Freq: Two times a day (BID) | ORAL | 0 refills | Status: DC

## 2023-10-15 MED ORDER — PROMETHAZINE-DM 6.25-15 MG/5ML PO SYRP: 5.0000 mL | ORAL_SOLUTION | Freq: Every evening | ORAL | 0 refills | Status: DC | PRN

## 2023-10-15 NOTE — Telephone Encounter (Signed)
Copied from CRM [id number]. Topic: Clinical - Red Word Triage >> Oct 15, 2023  8:05 AM Steele Sizer wrote: Red Word that prompted transfer to Nurse Triage: Pt stated that she has been sick since tuesday, fever, body aches, headach, coughing, nauseau, no appetite.   Chief Complaint: URI symptoms Symptoms: mild SOB after coughing, non-productive cough, prior chest congestion, fever, body aches, headache, nausea, no appetite Frequency: continual Pertinent Negatives: Patient denies chest pain, coughing anything up including blood, vomiting, diarrhea, difficulty breathing Disposition: [] 911 / [] ED /[] Urgent Care (no appt availability in office) / [x] Appointment(In office/virtual)/ []  [facility]/ [] Home Care/ [] Refused Recommended Disposition /[] [facility]/ []  Follow-up with PCP Additional Notes: Pt reporting symptoms since Tuesday of nonproductive cough, chest congestion earlier on, "get a little winded after coughing," fever 100-101.9 F that goes down with OTC meds "but not a whole lot," headache, body aches, nausea, no appetite, "feel like death, feel awful, think I have the flu." Pt requesting to schedule an appt now. Nurse not complete with triage, advised couple more questions to ensure not emergent. Pt confirms not having to catch her breath, confirms hx of a-fib, SVT, and autoimmune disorder. Pt reporting no chest pain, no wheezing. Advised pt be examined in next 4 hours, scheduled pt with PCP, confirmed location/appt info. Advised ensure hydration, humidifier, call if any worsening. Pt hung up.  Reason for Disposition  [1] Fever > 100.0 F (37.8 C) AND [2] diabetes mellitus or weak immune system (e.g., HIV positive, cancer chemo, splenectomy, organ transplant, chronic steroids)  Answer Assessment - Initial Assessment Questions 1. ONSET: "When did the cough begin?"      Tuesday 2. SEVERITY: "How bad is the cough today?"      When first get up cough a lot anyway 3. SPUTUM:  "Describe the color of your sputum" (none, dry cough; clear, white, yellow, green)     Not coughing anything up 4. HEMOPTYSIS: "Are you coughing up any blood?" If so ask: "How much?" (flecks, streaks, tablespoons, etc.)     denies 5. DIFFICULTY BREATHING: "Are you having difficulty breathing?" If Yes, ask: "How bad is it?" (e.g., mild, moderate, severe)    - MILD: No SOB at rest, mild SOB with walking, speaks normally in sentences, can lie down, no retractions, pulse < 100.    - MODERATE: SOB at rest, SOB with minimal exertion and prefers to sit, cannot lie down flat, speaks in phrases, mild retractions, audible wheezing, pulse 100-120.    - SEVERE: Very SOB at rest, speaks in single words, struggling to breathe, sitting hunched forward, retractions, pulse > 120      Get a little winded after coughing, not having to catch breath 6. FEVER: "Do you have a fever?" If Yes, ask: "What is your temperature, how was it measured, and when did it start?"     100-101.9 comes down with OTC meds but not a whole lot 7. CARDIAC HISTORY: "Do you have any history of heart disease?" (e.g., heart attack, congestive heart failure)      Take heart meds, a-fib, SVT, autoimmune disorder 8. LUNG HISTORY: "Do you have any history of lung disease?"  (e.g., pulmonary embolus, asthma, emphysema)     denies 9. PE RISK FACTORS: "Do you have a history of blood clots?" (or: recent major surgery, recent prolonged travel, bedridden)    denies 10. OTHER SYMPTOMS: "Do you have any other symptoms?" (e.g., runny nose, wheezing, chest pain)       Feel  like death, feel awful, think I have the flu, headache just taken OTC meds started with pains in different spots now whole head, nausea, no appetite, body aches  Protocols used: Cough - Acute Non-Productive-A-AH

## 2023-10-15 NOTE — Assessment & Plan Note (Signed)
Discussed symptomatic care.  Hydration, rest. Call if SOB, cough worsening or prolongued fever. Reviewed flu timeline. She has  health problems that put her at risk for complications  ( atrial fibrillation), so we decided to use of tamiflu. Pt agreed. Discussed family prophylaxis  to consider tamiflu prophylaxis. She was advised to not return to work until 24 hour after fever resolved on no antipyretics.   Return and ER precautions provided

## 2023-10-15 NOTE — Progress Notes (Signed)
Patient ID: Jill Leonard, female    DOB: 1971/06/16, 53 y.o.   MRN: [id number]  This visit was conducted in person.  BP 96/60 (BP Location: Right Arm, Patient Position: Sitting, Cuff Size: Normal)   Pulse 61   Temp (!) 100.6 F (38.1 C) (Temporal)   Ht 5\' 7"  (1.702 m)   Wt 164 lb 8 oz (74.6 kg)   LMP 07/03/2018   SpO2 95%   BMI 25.76 kg/m    CC:  Chief Complaint  Patient presents with   Cough    Symptoms started on Tuesday   Headache   Nausea   Generalized Body Aches   Fever    Subjective:   HPI: Jill Leonard is a 53 y.o. female  afib, presenting on 10/15/2023 for Cough (Symptoms started on Tuesday), Headache, Nausea, Generalized Body Aches, and Fever   Date of onset: 3 days Initial symptoms included headache, nausea, body ache Symptoms progressed to fever 101.9 F, cough   No SOB, no wheeze   Sick contacts:  husband sick recently COVID testing:   none     She has tried to treat with  Nyquil.     No history of chronic lung disease such as asthma or COPD. Non-smoker.       Relevant past medical, surgical, family and social history reviewed and updated as indicated. Interim medical history since our last visit reviewed. Allergies and medications reviewed and updated. Outpatient Medications Prior to Visit  Medication Sig Dispense Refill   acetaminophen (TYLENOL) 500 MG tablet Take 1,000 mg by mouth as needed for moderate pain (pain score 4-6) or headache.     aspirin-acetaminophen-caffeine (EXCEDRIN MIGRAINE) 250-250-65 MG tablet Take 1 tablet by mouth as needed for headache or migraine.     BIOTIN PO Take 1 tablet by mouth in the morning. Biotin 10,000 mcg + Calcium     calcium carbonate (TUMS - DOSED IN MG ELEMENTAL CALCIUM) 500 MG chewable tablet Chew 500 mg by mouth as needed for indigestion or heartburn.     Carboxymethylcellul-Glycerin (LUBRICATING EYE DROPS OP) Place 1 drop into both eyes daily as needed (dry eyes).     cholecalciferol (VITAMIN  D) 1000 UNITS tablet Take 1,000 Units by mouth every evening.     diphenhydramine-acetaminophen (TYLENOL PM) 25-500 MG TABS tablet Take 1 tablet by mouth at bedtime as needed (sleep).      doxycycline (VIBRA-TABS) 100 MG tablet Take 1 tablet (100 mg total) by mouth 2 (two) times daily. 14 tablet 0   escitalopram (LEXAPRO) 20 MG tablet Take 1 tablet (20 mg total) by mouth daily. 90 tablet 0   flecainide (TAMBOCOR) 50 MG tablet Take 1 tablet (50 mg total) by mouth 2 (two) times daily. 180 tablet 2   fluticasone (FLONASE) 50 MCG/ACT nasal spray Place 1 spray into both nostrils daily.     metoprolol succinate (TOPROL-XL) 25 MG 24 hr tablet TAKE 1 TABLET BY MOUTH DAILY 90 tablet 2   pantoprazole (PROTONIX) 40 MG tablet Take 1 tablet (40 mg total) by mouth daily. 90 tablet 0   TART CHERRY PO Take 500 mg by mouth daily.     Turmeric (QC TUMERIC COMPLEX PO) Take 1 tablet by mouth 2 (two) times daily. Tumeric ginger complex - 6000 mcg daily     No facility-administered medications prior to visit.     Per HPI unless specifically indicated in ROS section below Review of Systems  Constitutional:  Negative for fatigue and fever.  HENT:  Positive for congestion.   Eyes:  Negative for pain.  Respiratory:  Positive for cough and wheezing. Negative for shortness of breath.   Cardiovascular:  Negative for chest pain, palpitations and leg swelling.  Gastrointestinal:  Negative for abdominal pain.  Genitourinary:  Negative for dysuria and vaginal bleeding.  Musculoskeletal:  Negative for back pain.  Neurological:  Negative for syncope, light-headedness and headaches.  Psychiatric/Behavioral:  Negative for dysphoric mood.    Objective:  BP 96/60 (BP Location: Right Arm, Patient Position: Sitting, Cuff Size: Normal)   Pulse 61   Temp (!) 100.6 F (38.1 C) (Temporal)   Ht 5\' 7"  (1.702 m)   Wt 164 lb 8 oz (74.6 kg)   LMP 07/03/2018   SpO2 95%   BMI 25.76 kg/m   Wt Readings from Last 3 Encounters:   10/15/23 164 lb 8 oz (74.6 kg)  09/27/23 165 lb (74.8 kg)  09/20/23 164 lb (74.4 kg)      Physical Exam Constitutional:      General: She is not in acute distress.    Appearance: She is well-developed. She is not ill-appearing or toxic-appearing.  HENT:     Head: Normocephalic.     Right Ear: Hearing, tympanic membrane, ear canal and external ear normal. Tympanic membrane is not erythematous, retracted or bulging.     Left Ear: Hearing, tympanic membrane, ear canal and external ear normal. Tympanic membrane is not erythematous, retracted or bulging.     Nose: Mucosal edema and rhinorrhea present.     Right Sinus: No maxillary sinus tenderness or frontal sinus tenderness.     Left Sinus: No maxillary sinus tenderness or frontal sinus tenderness.     Mouth/Throat:     Pharynx: Uvula midline.  Eyes:     General: Lids are normal. Lids are everted, no foreign bodies appreciated.     Conjunctiva/sclera: Conjunctivae normal.     Pupils: Pupils are equal, round, and reactive to light.  Neck:     Thyroid: No thyroid mass or thyromegaly.     Vascular: No carotid bruit.     Trachea: Trachea normal.  Cardiovascular:     Rate and Rhythm: Normal rate and regular rhythm.     Pulses: Normal pulses.     Heart sounds: Normal heart sounds, S1 normal and S2 normal. No murmur heard.    No friction rub. No gallop.  Pulmonary:     Effort: Pulmonary effort is normal. No tachypnea or respiratory distress.     Breath sounds: Normal breath sounds. No decreased breath sounds, wheezing, rhonchi or rales.  Musculoskeletal:     Cervical back: Normal range of motion and neck supple.  Skin:    General: Skin is warm and dry.     Findings: No rash.  Neurological:     Mental Status: She is alert.  Psychiatric:        Mood and Affect: Mood is not anxious or depressed.        Speech: Speech normal.        Behavior: Behavior normal. Behavior is cooperative.        Judgment: Judgment normal.       Results  for orders placed or performed in visit on 10/15/23  POC Influenza A&B (Binax test)   Collection Time: 10/15/23 11:22 AM  Result Value Ref Range   Influenza A, POC Positive (A) Negative   Influenza B, POC Negative Negative  POC COVID-19   Collection Time: 10/15/23 11:23 AM  Result Value Ref Range   SARS Coronavirus 2 Ag Negative Negative    Assessment and Plan  Influenza A Assessment & Plan: Discussed symptomatic care.  Hydration, rest. Call if SOB, cough worsening or prolongued fever. Reviewed flu timeline. She has  health problems that put her at risk for complications  ( atrial fibrillation), so we decided to use of tamiflu. Pt agreed. Discussed family prophylaxis  to consider tamiflu prophylaxis. She was advised to not return to work until 24 hour after fever resolved on no antipyretics.   Return and ER precautions provided    Acute cough -     POC Influenza A&B(BINAX/QUICKVUE) -     POC COVID-19 BinaxNow  Other orders -     Oseltamivir Phosphate; Take 1 capsule (75 mg total) by mouth 2 (two) times daily.  Dispense: 10 capsule; Refill: 0 -     Promethazine-DM; Take 5 mLs by mouth at bedtime as needed for cough.  Dispense: 118 mL; Refill: 0    No follow-ups on file.   Kerby Nora, MD

## 2023-10-18 ENCOUNTER — Encounter: Payer: Self-pay | Admitting: Family Medicine

## 2023-10-21 ENCOUNTER — Encounter: Payer: Self-pay | Admitting: Family Medicine

## 2023-10-21 ENCOUNTER — Ambulatory Visit: Payer: BC Managed Care – PPO | Admitting: Family Medicine

## 2023-10-21 VITALS — BP 118/84 | HR 76 | Temp 98.0°F | Ht 67.0 in | Wt 162.0 lb

## 2023-10-21 DIAGNOSIS — J101 Influenza due to other identified influenza virus with other respiratory manifestations: Secondary | ICD-10-CM

## 2023-10-21 DIAGNOSIS — R0789 Other chest pain: Secondary | ICD-10-CM

## 2023-10-21 NOTE — Assessment & Plan Note (Signed)
 Acute, resolution of initial acute symptoms but now with persistent cough.  She is on day 9 of illness. Completed Tamiflu  course. Symptoms most consistent with postinfectious bronchospasm.  Offered prednisone taper but patient not currently interested.  Can use albuterol  inhaler as needed for coughing fits.  Use Tylenol  extra strength 2 tablets 3 times a day as needed for chest wall pain.  Encouraged her to take deep breaths to fill lungs completely. Lung exam clear and no sign of bacterial superinfection  Recommend rest, fluids supportive care and time. Return and ER precautions provided

## 2023-10-21 NOTE — Progress Notes (Signed)
 Patient ID: Jill Leonard, female    DOB: [birth year], 53 y.o.   MRN: [id number]  This visit was conducted in person.  BP 118/84 (BP Location: Right Arm, Patient Position: Sitting, Cuff Size: Normal)   Pulse 76   Temp 98 F (36.7 C) (Temporal)   Ht 5' 7 (1.702 m)   Wt 162 lb (73.5 kg)   LMP 07/03/2018   SpO2 96%   BMI 25.37 kg/m    CC:  Chief Complaint  Patient presents with   Acute Visit    Follow up from flu, still having issues with coughing.    Subjective:   HPI: Jill Leonard is a 53 y.o. female presenting on 10/21/2023 for Acute Visit (Follow up from flu, still having issues with coughing.)  Patient diagnosed with influenza on October 15, 2023, treated with Tamiflu  75 mg p.o. twice daily ( took for 4 days given stomach upset). Today she returns for follow-up influenza, day 9 of illness.  She states she still has issues with ongoing coughing.  No further fever.  She continues to be fatigued, continued dry cough.  Sore on right lower chest wall.. pain with turning over in bed at night.  Hearing wheezing sound at times.  Mild SOB.   She is keeping well with liquids.. has had 4-5 16 oz bottles of water  today.        Relevant past medical, surgical, family and social history reviewed and updated as indicated. Interim medical history since our last visit reviewed. Allergies and medications reviewed and updated. Outpatient Medications Prior to Visit  Medication Sig Dispense Refill   acetaminophen  (TYLENOL ) 500 MG tablet Take 1,000 mg by mouth as needed for moderate pain (pain score 4-6) or headache.     aspirin-acetaminophen -caffeine  (EXCEDRIN MIGRAINE) 250-250-65 MG tablet Take 1 tablet by mouth as needed for headache or migraine.     BIOTIN PO Take 1 tablet by mouth in the morning. Biotin 10,000 mcg + Calcium      calcium  carbonate (TUMS - DOSED IN MG ELEMENTAL CALCIUM ) 500 MG chewable tablet Chew 500 mg by mouth as needed for indigestion or heartburn.      Carboxymethylcellul-Glycerin (LUBRICATING EYE DROPS OP) Place 1 drop into both eyes daily as needed (dry eyes).     cholecalciferol (VITAMIN D ) 1000 UNITS tablet Take 1,000 Units by mouth every evening.     diphenhydramine-acetaminophen  (TYLENOL  PM) 25-500 MG TABS tablet Take 1 tablet by mouth at bedtime as needed (sleep).      escitalopram  (LEXAPRO ) 20 MG tablet Take 1 tablet (20 mg total) by mouth daily. 90 tablet 0   flecainide  (TAMBOCOR ) 50 MG tablet Take 1 tablet (50 mg total) by mouth 2 (two) times daily. 180 tablet 2   fluticasone (FLONASE) 50 MCG/ACT nasal spray Place 1 spray into both nostrils daily.     metoprolol  succinate (TOPROL -XL) 25 MG 24 hr tablet TAKE 1 TABLET BY MOUTH DAILY 90 tablet 2   oseltamivir  (TAMIFLU ) 75 MG capsule Take 1 capsule (75 mg total) by mouth 2 (two) times daily. 10 capsule 0   pantoprazole  (PROTONIX ) 40 MG tablet Take 1 tablet (40 mg total) by mouth daily. 90 tablet 0   TART CHERRY PO Take 500 mg by mouth daily.     Turmeric (QC TUMERIC COMPLEX PO) Take 1 tablet by mouth 2 (two) times daily. Tumeric ginger complex - 6000 mcg daily     promethazine -dextromethorphan (PROMETHAZINE -DM) 6.25-15 MG/5ML syrup Take 5 mLs by mouth at bedtime  as needed for cough. (Patient not taking: Reported on 10/21/2023) 118 mL 0   doxycycline  (VIBRA -TABS) 100 MG tablet Take 1 tablet (100 mg total) by mouth 2 (two) times daily. 14 tablet 0   No facility-administered medications prior to visit.     Per HPI unless specifically indicated in ROS section below Review of Systems  Constitutional:  Positive for fatigue. Negative for fever.  HENT:  Negative for congestion.   Eyes:  Negative for pain.  Respiratory:  Positive for cough. Negative for chest tightness and shortness of breath.   Cardiovascular:  Positive for chest pain. Negative for palpitations and leg swelling.  Gastrointestinal:  Negative for abdominal pain.  Genitourinary:  Negative for dysuria and vaginal bleeding.   Musculoskeletal:  Negative for back pain.  Neurological:  Negative for syncope, light-headedness and headaches.  Psychiatric/Behavioral:  Negative for dysphoric mood.    Objective:  BP 118/84 (BP Location: Right Arm, Patient Position: Sitting, Cuff Size: Normal)   Pulse 76   Temp 98 F (36.7 C) (Temporal)   Ht 5' 7 (1.702 m)   Wt 162 lb (73.5 kg)   LMP 07/03/2018   SpO2 96%   BMI 25.37 kg/m   Wt Readings from Last 3 Encounters:  10/21/23 162 lb (73.5 kg)  10/15/23 164 lb 8 oz (74.6 kg)  09/27/23 165 lb (74.8 kg)      Physical Exam Constitutional:      General: She is not in acute distress.    Appearance: She is well-developed. She is not ill-appearing or toxic-appearing.  HENT:     Head: Normocephalic.     Right Ear: Hearing, tympanic membrane, ear canal and external ear normal. Tympanic membrane is not erythematous, retracted or bulging.     Left Ear: Hearing, tympanic membrane, ear canal and external ear normal. Tympanic membrane is not erythematous, retracted or bulging.     Nose: Mucosal edema and rhinorrhea present.     Right Sinus: No maxillary sinus tenderness or frontal sinus tenderness.     Left Sinus: No maxillary sinus tenderness or frontal sinus tenderness.     Mouth/Throat:     Pharynx: Uvula midline.  Eyes:     General: Lids are normal. Lids are everted, no foreign bodies appreciated.     Conjunctiva/sclera: Conjunctivae normal.     Pupils: Pupils are equal, round, and reactive to light.  Neck:     Thyroid : No thyroid  mass or thyromegaly.     Vascular: No carotid bruit.     Trachea: Trachea normal.  Cardiovascular:     Rate and Rhythm: Normal rate and regular rhythm.     Pulses: Normal pulses.     Heart sounds: Normal heart sounds, S1 normal and S2 normal. No murmur heard.    No friction rub. No gallop.  Pulmonary:     Effort: Pulmonary effort is normal. No tachypnea or respiratory distress.     Breath sounds: Normal breath sounds. No decreased breath  sounds, wheezing, rhonchi or rales.  Chest:     Chest wall: Tenderness present.    Musculoskeletal:     Cervical back: Normal range of motion and neck supple.  Skin:    General: Skin is warm and dry.     Findings: No rash.  Neurological:     Mental Status: She is alert.  Psychiatric:        Mood and Affect: Mood is not anxious or depressed.        Speech: Speech normal.  Behavior: Behavior normal. Behavior is cooperative.        Judgment: Judgment normal.       Results for orders placed or performed in visit on 10/15/23  POC Influenza A&B (Binax test)   Collection Time: 10/15/23 11:22 AM  Result Value Ref Range   Influenza A, POC Positive (A) Negative   Influenza B, POC Negative Negative  POC COVID-19   Collection Time: 10/15/23 11:23 AM  Result Value Ref Range   SARS Coronavirus 2 Ag Negative Negative    Assessment and Plan  Influenza A Assessment & Plan: Acute, resolution of initial acute symptoms but now with persistent cough.  She is on day 9 of illness. Completed Tamiflu  course. Symptoms most consistent with postinfectious bronchospasm.  Offered prednisone taper but patient not currently interested.  Can use albuterol  inhaler as needed for coughing fits.  Use Tylenol  extra strength 2 tablets 3 times a day as needed for chest wall pain.  Encouraged her to take deep breaths to fill lungs completely. Lung exam clear and no sign of bacterial superinfection  Recommend rest, fluids supportive care and time. Return and ER precautions provided   Acute chest wall pain Assessment & Plan: Acute most likely musculoskeletal pain.     No follow-ups on file.   Greig Ring, MD

## 2023-10-21 NOTE — Assessment & Plan Note (Signed)
 Acute most likely musculoskeletal pain.

## 2023-11-18 ENCOUNTER — Encounter: Payer: Self-pay | Admitting: Family Medicine

## 2023-11-18 ENCOUNTER — Ambulatory Visit: Admitting: Family Medicine

## 2023-11-18 VITALS — BP 124/86 | HR 67 | Temp 98.4°F | Ht 67.0 in | Wt 166.2 lb

## 2023-11-18 DIAGNOSIS — J0100 Acute maxillary sinusitis, unspecified: Secondary | ICD-10-CM | POA: Diagnosis not present

## 2023-11-18 MED ORDER — AMOXICILLIN-POT CLAVULANATE 875-125 MG PO TABS: 1.0000 | ORAL_TABLET | Freq: Two times a day (BID) | ORAL | 0 refills | Status: DC

## 2023-11-18 NOTE — Progress Notes (Signed)
 Patient ID: Jill Leonard, female    DOB: 04-03-1971, 53 y.o.   MRN: [id number]  This visit was conducted in person.  BP 124/86   Pulse 67   Temp 98.4 F (36.9 C) (Oral)   Ht 5\' 7"  (1.702 m)   Wt 166 lb 4 oz (75.4 kg)   LMP 07/03/2018   SpO2 96%   BMI 26.04 kg/m    CC:  Chief Complaint  Patient presents with   Sinus Problem    C/o nasal congestion, bloody discharge and fatigue. Sxs started about 3 wks ago. H/o sinus infections.     Subjective:   HPI: Jill Leonard is a 53 y.o. female presenting on 11/18/2023 for Sinus Problem (C/o nasal congestion, bloody discharge and fatigue. Sxs started about 3 wks ago. H/o sinus infections. )  Flu  resolved 4-5 weeks ago  Date of onset:  2-3 weeks Initial symptoms included  nasal congestion,  Symptoms progressed to Bilateral face pain, HA, and tooth pressure  Bloody yellow mucus.  Bilateral ear popping  Mild fatigue.  No SOB, no wheeze  No fever.   Sick contacts:  none COVID testing:   none     She has tried to treat with  Humidifier, saline spray , netty pot,  flonase every other day   No history of chronic lung disease such as asthma or COPD. Non-smoker.       Relevant past medical, surgical, family and social history reviewed and updated as indicated. Interim medical history since our last visit reviewed. Allergies and medications reviewed and updated. Outpatient Medications Prior to Visit  Medication Sig Dispense Refill   acetaminophen (TYLENOL) 500 MG tablet Take 1,000 mg by mouth as needed for moderate pain (pain score 4-6) or headache.     aspirin-acetaminophen-caffeine (EXCEDRIN MIGRAINE) 250-250-65 MG tablet Take 1 tablet by mouth as needed for headache or migraine.     BIOTIN PO Take 1 tablet by mouth in the morning. Biotin 10,000 mcg + Calcium     calcium carbonate (TUMS - DOSED IN MG ELEMENTAL CALCIUM) 500 MG chewable tablet Chew 500 mg by mouth as needed for indigestion or heartburn.      Carboxymethylcellul-Glycerin (LUBRICATING EYE DROPS OP) Place 1 drop into both eyes daily as needed (dry eyes).     cholecalciferol (VITAMIN D) 1000 UNITS tablet Take 1,000 Units by mouth every evening.     diphenhydramine-acetaminophen (TYLENOL PM) 25-500 MG TABS tablet Take 1 tablet by mouth at bedtime as needed (sleep).      escitalopram (LEXAPRO) 20 MG tablet Take 1 tablet (20 mg total) by mouth daily. 90 tablet 0   flecainide (TAMBOCOR) 50 MG tablet Take 1 tablet (50 mg total) by mouth 2 (two) times daily. 180 tablet 2   fluticasone (FLONASE) 50 MCG/ACT nasal spray Place 1 spray into both nostrils daily.     metoprolol succinate (TOPROL-XL) 25 MG 24 hr tablet TAKE 1 TABLET BY MOUTH DAILY 90 tablet 2   pantoprazole (PROTONIX) 40 MG tablet Take 1 tablet (40 mg total) by mouth daily. 90 tablet 0   TART CHERRY PO Take 500 mg by mouth daily.     Turmeric (QC TUMERIC COMPLEX PO) Take 1 tablet by mouth 2 (two) times daily. Tumeric ginger complex - 6000 mcg daily     oseltamivir (TAMIFLU) 75 MG capsule Take 1 capsule (75 mg total) by mouth 2 (two) times daily. 10 capsule 0   promethazine-dextromethorphan (PROMETHAZINE-DM) 6.25-15 MG/5ML syrup Take 5  mLs by mouth at bedtime as needed for cough. (Patient not taking: Reported on 10/21/2023) 118 mL 0   No facility-administered medications prior to visit.     Per HPI unless specifically indicated in ROS section below Review of Systems  Constitutional:  Negative for fatigue and fever.  HENT:  Positive for congestion, sinus pressure and sinus pain.   Eyes:  Negative for pain.  Respiratory:  Negative for cough and shortness of breath.   Cardiovascular:  Negative for chest pain, palpitations and leg swelling.  Gastrointestinal:  Negative for abdominal pain.  Genitourinary:  Negative for dysuria and vaginal bleeding.  Musculoskeletal:  Negative for back pain.  Neurological:  Negative for syncope, light-headedness and headaches.  Psychiatric/Behavioral:   Negative for dysphoric mood.    Objective:  BP 124/86   Pulse 67   Temp 98.4 F (36.9 C) (Oral)   Ht 5\' 7"  (1.702 m)   Wt 166 lb 4 oz (75.4 kg)   LMP 07/03/2018   SpO2 96%   BMI 26.04 kg/m   Wt Readings from Last 3 Encounters:  11/25/23 165 lb 9.6 oz (75.1 kg)  11/18/23 166 lb 4 oz (75.4 kg)  10/21/23 162 lb (73.5 kg)      Physical Exam Constitutional:      General: She is not in acute distress.    Appearance: She is well-developed. She is not ill-appearing or toxic-appearing.  HENT:     Head: Normocephalic.     Right Ear: Hearing, tympanic membrane, ear canal and external ear normal. Tympanic membrane is not erythematous, retracted or bulging.     Left Ear: Hearing, tympanic membrane, ear canal and external ear normal. Tympanic membrane is not erythematous, retracted or bulging.     Nose: Mucosal edema and rhinorrhea present.     Right Sinus: No maxillary sinus tenderness or frontal sinus tenderness.     Left Sinus: No maxillary sinus tenderness or frontal sinus tenderness.     Mouth/Throat:     Pharynx: Uvula midline.  Eyes:     General: Lids are normal. Lids are everted, no foreign bodies appreciated.     Conjunctiva/sclera: Conjunctivae normal.     Pupils: Pupils are equal, round, and reactive to light.  Neck:     Thyroid: No thyroid mass or thyromegaly.     Vascular: No carotid bruit.     Trachea: Trachea normal.  Cardiovascular:     Rate and Rhythm: Normal rate and regular rhythm.     Pulses: Normal pulses.     Heart sounds: Normal heart sounds, S1 normal and S2 normal. No murmur heard.    No friction rub. No gallop.  Pulmonary:     Effort: Pulmonary effort is normal. No tachypnea or respiratory distress.     Breath sounds: Normal breath sounds. No decreased breath sounds, wheezing, rhonchi or rales.  Musculoskeletal:     Cervical back: Normal range of motion and neck supple.  Skin:    General: Skin is warm and dry.     Findings: No rash.  Neurological:      Mental Status: She is alert.  Psychiatric:        Mood and Affect: Mood is not anxious or depressed.        Speech: Speech normal.        Behavior: Behavior normal. Behavior is cooperative.        Judgment: Judgment normal.       Results for orders placed or performed in visit on 10/15/23  POC Influenza A&B (Binax test)   Collection Time: 10/15/23 11:22 AM  Result Value Ref Range   Influenza A, POC Positive (A) Negative   Influenza B, POC Negative Negative  POC COVID-19   Collection Time: 10/15/23 11:23 AM  Result Value Ref Range   SARS Coronavirus 2 Ag Negative Negative    Assessment and Plan  Acute non-recurrent maxillary sinusitis Assessment & Plan: Acute, recent now resolved flu infection now likely with bacterial superinfection. Will treat with antibiotics Augmentin twice daily x 10 days. Continued nasal saline irrigation and Flonase 2 sprays per nostril daily and Tylenol as needed for sinus pressure and pain.  Return and ER precautions provided.   Other orders -     Amoxicillin-Pot Clavulanate; Take 1 tablet by mouth 2 (two) times daily.  Dispense: 20 tablet; Refill: 0    No follow-ups on file.   Kerby Nora, MD

## 2023-11-23 ENCOUNTER — Encounter: Payer: Self-pay | Admitting: Family Medicine

## 2023-11-25 ENCOUNTER — Ambulatory Visit (HOSPITAL_COMMUNITY)
Admission: RE | Admit: 2023-11-25 | Discharge: 2023-11-25 | Disposition: A | Discharge status: Home / Self Care | Payer: BC Managed Care – PPO | Source: Ambulatory Visit | Attending: Internal Medicine | Admitting: Internal Medicine

## 2023-11-25 VITALS — BP 100/72 | HR 60 | Ht 67.0 in | Wt 165.6 lb

## 2023-11-25 DIAGNOSIS — Z5181 Encounter for therapeutic drug level monitoring: Secondary | ICD-10-CM | POA: Insufficient documentation

## 2023-11-25 DIAGNOSIS — E785 Hyperlipidemia, unspecified: Secondary | ICD-10-CM | POA: Insufficient documentation

## 2023-11-25 DIAGNOSIS — Z79899 Other long term (current) drug therapy: Secondary | ICD-10-CM | POA: Diagnosis not present

## 2023-11-25 DIAGNOSIS — M3500 Sicca syndrome, unspecified: Secondary | ICD-10-CM | POA: Diagnosis not present

## 2023-11-25 DIAGNOSIS — I480 Paroxysmal atrial fibrillation: Secondary | ICD-10-CM | POA: Diagnosis not present

## 2023-11-25 NOTE — Progress Notes (Signed)
 Primary Care Physician: Excell Seltzer, MD Primary Cardiologist: Lorine Bears, MD Electrophysiologist: None     Referring Physician: Dr. Diamantina Monks is a 52 y.o. female with a history of Sjogren's syndrome, HLD, borderline MVP, and paroxysmal atrial fibrillation who presents for consultation in the [facility]. She contacted office noting increased Afib burden recently. She currently takes Toprol 25 mg daily. Patient is not on anticoagulation. She has a CHADS2VASC score of 1.  On evaluation today, she is currently in NSR. She has noted increased frequency of Afib in November and is interested in treatment for this. She has also noted increased frequency of tachycardia episodes as well. They typically occur at night and are very uncomfortable.   On follow up 11/25/23, patient is here for flecainide surveillance. She is currently in NSR. Her burden of Afib has been overall well controlled on flecainide. She is currently taking augmentin for a sinus infection. She is not on OAC due to low risk score.   Today, she denies symptoms of chest pain, shortness of breath, orthopnea, PND, lower extremity edema, dizziness, presyncope, syncope, snoring, daytime somnolence, bleeding, or neurologic sequela. The patient is tolerating medications without difficulties and is otherwise without complaint today.    Atrial Fibrillation Risk Factors:  she does not have symptoms or diagnosis of sleep apnea.   she has a BMI of Body mass index is 25.94 kg/m.Marland Kitchen Filed Weights   11/25/23 1531  Weight: 75.1 kg     Current Outpatient Medications  Medication Sig Dispense Refill   acetaminophen (TYLENOL) 500 MG tablet Take 1,000 mg by mouth as needed for moderate pain (pain score 4-6) or headache.     amoxicillin-clavulanate (AUGMENTIN) 875-125 MG tablet Take 1 tablet by mouth 2 (two) times daily. 20 tablet 0   aspirin-acetaminophen-caffeine (EXCEDRIN MIGRAINE)  250-250-65 MG tablet Take 1 tablet by mouth as needed for headache or migraine.     BIOTIN PO Take 1 tablet by mouth in the morning. Biotin 10,000 mcg + Calcium     calcium carbonate (TUMS - DOSED IN MG ELEMENTAL CALCIUM) 500 MG chewable tablet Chew 500 mg by mouth as needed for indigestion or heartburn.     Carboxymethylcellul-Glycerin (LUBRICATING EYE DROPS OP) Place 1 drop into both eyes daily as needed (dry eyes).     cholecalciferol (VITAMIN D) 1000 UNITS tablet Take 1,000 Units by mouth every evening.     diphenhydramine-acetaminophen (TYLENOL PM) 25-500 MG TABS tablet Take 1 tablet by mouth at bedtime as needed (sleep).      escitalopram (LEXAPRO) 20 MG tablet Take 1 tablet (20 mg total) by mouth daily. 90 tablet 0   flecainide (TAMBOCOR) 50 MG tablet Take 1 tablet (50 mg total) by mouth 2 (two) times daily. 180 tablet 2   fluticasone (FLONASE) 50 MCG/ACT nasal spray Place 1 spray into both nostrils daily.     metoprolol succinate (TOPROL-XL) 25 MG 24 hr tablet TAKE 1 TABLET BY MOUTH DAILY 90 tablet 2   pantoprazole (PROTONIX) 40 MG tablet Take 1 tablet (40 mg total) by mouth daily. 90 tablet 0   TART CHERRY PO Take 500 mg by mouth daily.     Turmeric (QC TUMERIC COMPLEX PO) Take 1 tablet by mouth 2 (two) times daily. Tumeric ginger complex - 6000 mcg daily     No current facility-administered medications for this encounter.    Atrial Fibrillation Management history:  Previous antiarrhythmic drugs: flecainide Previous cardioversions:  none Previous ablations: none Anticoagulation history: none   ROS- All systems are reviewed and negative except as per the HPI above.  Physical Exam: BP 100/72   Pulse 60   Ht 5\' 7"  (1.702 m)   Wt 75.1 kg   LMP 07/03/2018   BMI 25.94 kg/m   GEN- The patient is well appearing, alert and oriented x 3 today.   Neck - no JVD or carotid bruit noted Lungs- Clear to ausculation bilaterally, normal work of breathing Heart- Regular rate and rhythm,  no murmurs, rubs or gallops, PMI not laterally displaced Extremities- no clubbing, cyanosis, or edema Skin - no rash or ecchymosis noted   EKG today demonstrates  Vent. rate 60 BPM PR interval 182 ms QRS duration 78 ms QT/QTcB 424/424 ms P-R-T axes 72 68 60 Normal sinus rhythm Low voltage QRS Borderline ECG When compared with ECG of 25-Aug-2023 15:26, PREVIOUS ECG IS PRESENT  Echo 03/22/23 demonstrated  1. Left ventricular ejection fraction, by estimation, is 65 to 70%. The  left ventricle has normal function. The left ventricle has no regional  wall motion abnormalities. Left ventricular diastolic parameters were  normal. The average left ventricular  global longitudinal strain is -21.5 %. The global longitudinal strain is  normal.   2. Right ventricular systolic function is normal. The right ventricular  size is normal.   3. The mitral valve is normal in structure. Trivial mitral valve  regurgitation. No evidence of mitral stenosis. There is mild prolapse of  anterior leaflet of the mitral valve.   4. The aortic valve is tricuspid. Aortic valve regurgitation is not  visualized. No aortic stenosis is present.   5. The inferior vena cava is normal in size with greater than 50%  respiratory variability, suggesting right atrial pressure of 3 mmHg.   Cardiac monitor 10-07/2022: Patch Wear Time:  13 days and 23 hours (2023-10-24T09:19:15-0400 to 2023-11-07T07:45:52-0500)   Patient had a min HR of 46 bpm, max HR of 164 bpm, and avg HR of 68 bpm. Predominant underlying rhythm was Sinus Rhythm. First Degree AV Block was present.  1 run of Supraventricular Tachycardia occurred lasting 9 beats with a max rate of 164 bpm (avg 125 bpm). Atrial Fibrillation occurred (1% burden), ranging from 59-160 bpm (avg of 96 bpm), the longest lasting 3 hours 17 mins with an avg rate of 87 bpm. Supraventricular Tachycardia and Atrial Fibrillation were detected within +/- 45 seconds of symptomatic patient  event(s).  Rare PACs and rare PVCs.   ASSESSMENT & PLAN CHA2DS2-VASc Score = 1  The patient's score is based upon: CHF History: 0 HTN History: 0 Diabetes History: 0 Stroke History: 0 Vascular Disease History: 0 Age Score: 0 Gender Score: 1       ASSESSMENT AND PLAN: Paroxysmal Atrial Fibrillation (ICD10:  I48.0) The patient's CHA2DS2-VASc score is 1, indicating a 0.6% annual risk of stroke.    She is currently in NSR. She is happy with her current rhythm control. Advised to monitor BP at home since today was borderline low.  High risk medication monitoring (ICD10: R7229428) Patient requires ongoing monitoring for anti-arrhythmic medication which has the potential to cause life threatening arrhythmias or AV block. ECG intervals are stable. Continue flecainide 50 mg BID. Continue Toprol 25 mg daily.   Follow up in 6 months for flecainide surveillance.   Lake Bells, PA-C  [facility] [address] [place], [place] [address] [phone]

## 2023-11-29 ENCOUNTER — Other Ambulatory Visit: Payer: Self-pay | Admitting: Family Medicine

## 2023-11-29 ENCOUNTER — Telehealth (HOSPITAL_COMMUNITY): Payer: Self-pay | Admitting: *Deleted

## 2023-11-29 DIAGNOSIS — R002 Palpitations: Secondary | ICD-10-CM

## 2023-11-29 DIAGNOSIS — K219 Gastro-esophageal reflux disease without esophagitis: Secondary | ICD-10-CM

## 2023-11-29 MED ORDER — METOPROLOL SUCCINATE ER 25 MG PO TB24
12.5000 mg | ORAL_TABLET | Freq: Every day | ORAL | Status: DC
Start: 2023-11-29 — End: 2023-12-27

## 2023-11-29 NOTE — Assessment & Plan Note (Signed)
 Acute, recent now resolved flu infection now likely with bacterial superinfection. Will treat with antibiotics Augmentin twice daily x 10 days. Continued nasal saline irrigation and Flonase 2 sprays per nostril daily and Tylenol as needed for sinus pressure and pain.  Return and ER precautions provided.

## 2023-11-29 NOTE — Telephone Encounter (Addendum)
 Pt calling stating she monitored her BP at home and BP 96/56 with dizziness. Discussed with Landry Mellow PA will decrease metoprolol to 12.5mg  once a day. Pt to call if issues after reduction.

## 2023-11-30 NOTE — Telephone Encounter (Signed)
 Patient has been seen for acute visits.  Would not schedule CPE due to already have a CPE with GYN.  Last office visit 11/18/2023 for acute sinusitis.  Last refilled 10/05/2023 for #90 with no refills.  Okay to continue refilling her medications.

## 2023-12-11 ENCOUNTER — Other Ambulatory Visit: Payer: Self-pay | Admitting: Family Medicine

## 2023-12-11 DIAGNOSIS — F32A Depression, unspecified: Secondary | ICD-10-CM

## 2023-12-11 DIAGNOSIS — F419 Anxiety disorder, unspecified: Secondary | ICD-10-CM

## 2023-12-13 NOTE — Telephone Encounter (Signed)
 LVM for patient to c/b and schedule.

## 2023-12-13 NOTE — Telephone Encounter (Signed)
 Please call and schedule CPE with fasting labs prior with Dr. Ermalene Searing.  Send back to me once scheduled to refill medication.

## 2023-12-14 NOTE — Telephone Encounter (Signed)
 Spoke to pt, scheduled cpe for 01/26/24

## 2023-12-25 ENCOUNTER — Other Ambulatory Visit: Payer: Self-pay | Admitting: Cardiovascular Disease

## 2023-12-25 DIAGNOSIS — R002 Palpitations: Secondary | ICD-10-CM

## 2024-01-18 ENCOUNTER — Telehealth (HOSPITAL_COMMUNITY): Payer: Self-pay

## 2024-01-18 DIAGNOSIS — R002 Palpitations: Secondary | ICD-10-CM

## 2024-01-18 MED ORDER — FLECAINIDE ACETATE 50 MG PO TABS: ORAL_TABLET | ORAL | Status: DC

## 2024-01-18 MED ORDER — METOPROLOL SUCCINATE ER 25 MG PO TB24: 12.5000 mg | ORAL_TABLET | Freq: Every day | ORAL | Status: DC

## 2024-01-18 NOTE — Telephone Encounter (Signed)
 Patient states she has been having headaches, light headed, dizzy, and her hair has been falling out. She is concerned and wanted to know if Ross Coombs would be ok with decreasing her Flecainide  and increasing her Metoprolol  medication. Ross Coombs instruct patient to decrease her Flecainide  from 50 mg to 25 mg - BID and continue her Metoprolol  medication at 12.5 mg daily. She was told to call back if she has further concerns. Consulted with patient and she verbalized understanding.

## 2024-01-19 ENCOUNTER — Encounter (HOSPITAL_COMMUNITY): Payer: Self-pay

## 2024-01-26 ENCOUNTER — Encounter: Admitting: Family Medicine

## 2024-01-26 ENCOUNTER — Encounter: Payer: Self-pay | Admitting: Family Medicine

## 2024-01-26 ENCOUNTER — Ambulatory Visit: Admitting: Family Medicine

## 2024-01-26 ENCOUNTER — Telehealth: Payer: Self-pay

## 2024-01-26 ENCOUNTER — Ambulatory Visit: Payer: Self-pay | Admitting: Family Medicine

## 2024-01-26 VITALS — BP 118/78 | HR 67 | Temp 98.0°F | Ht 67.0 in | Wt 160.2 lb

## 2024-01-26 DIAGNOSIS — R5383 Other fatigue: Secondary | ICD-10-CM | POA: Diagnosis not present

## 2024-01-26 DIAGNOSIS — Z0000 Encounter for general adult medical examination without abnormal findings: Secondary | ICD-10-CM

## 2024-01-26 DIAGNOSIS — M3509 Sicca syndrome with other organ involvement: Secondary | ICD-10-CM | POA: Diagnosis not present

## 2024-01-26 DIAGNOSIS — F331 Major depressive disorder, recurrent, moderate: Secondary | ICD-10-CM | POA: Diagnosis not present

## 2024-01-26 DIAGNOSIS — E559 Vitamin D deficiency, unspecified: Secondary | ICD-10-CM

## 2024-01-26 DIAGNOSIS — E7800 Pure hypercholesterolemia, unspecified: Secondary | ICD-10-CM | POA: Diagnosis not present

## 2024-01-26 DIAGNOSIS — I480 Paroxysmal atrial fibrillation: Secondary | ICD-10-CM

## 2024-01-26 DIAGNOSIS — E042 Nontoxic multinodular goiter: Secondary | ICD-10-CM | POA: Diagnosis not present

## 2024-01-26 DIAGNOSIS — I471 Supraventricular tachycardia, unspecified: Secondary | ICD-10-CM

## 2024-01-26 LAB — COMPREHENSIVE METABOLIC PANEL WITH GFR
ALT: 19 U/L (ref 0–35)
AST: 25 U/L (ref 0–37)
Albumin: 4.5 g/dL (ref 3.5–5.2)
Alkaline Phosphatase: 59 U/L (ref 39–117)
BUN: 15 mg/dL (ref 6–23)
CO2: 29 meq/L (ref 19–32)
Calcium: 9.3 mg/dL (ref 8.4–10.5)
Chloride: 104 meq/L (ref 96–112)
Creatinine, Ser: 0.84 mg/dL (ref 0.40–1.20)
GFR: 79.79 mL/min (ref >60.00)
Glucose, Bld: 81 mg/dL (ref 70–99)
Potassium: 4.1 meq/L (ref 3.5–5.1)
Sodium: 140 meq/L (ref 135–145)
Total Bilirubin: 0.5 mg/dL (ref 0.2–1.2)
Total Protein: 7.9 g/dL (ref 6.0–8.3)

## 2024-01-26 LAB — LIPID PANEL
Cholesterol: 249 mg/dL — ABNORMAL HIGH (ref 0–200)
HDL: 57.5 mg/dL (ref >39.00)
LDL Cholesterol: 174 mg/dL — ABNORMAL HIGH (ref 0–99)
NonHDL: 191.34
Total CHOL/HDL Ratio: 4
Triglycerides: 87 mg/dL (ref 0.0–149.0)
VLDL: 17.4 mg/dL (ref 0.0–40.0)

## 2024-01-26 LAB — T4, FREE: Free T4: 0.91 ng/dL (ref 0.60–1.60)

## 2024-01-26 LAB — T3, FREE: T3, Free: 4.3 pg/mL — ABNORMAL HIGH (ref 2.3–4.2)

## 2024-01-26 LAB — VITAMIN D 25 HYDROXY (VIT D DEFICIENCY, FRACTURES): VITD: 40.04 ng/mL (ref 30.00–100.00)

## 2024-01-26 LAB — TSH: TSH: 1.86 u[IU]/mL (ref 0.35–5.50)

## 2024-01-26 LAB — VITAMIN B12: Vitamin B-12: 277 pg/mL (ref 211–911)

## 2024-01-26 NOTE — Assessment & Plan Note (Addendum)
 Now seeing Dr. Alvira Josephs for rheumatology.

## 2024-01-26 NOTE — Assessment & Plan Note (Signed)
 Due for reevaluation

## 2024-01-26 NOTE — Assessment & Plan Note (Signed)
Chronic, controlled with metoprolol low dose daily.,

## 2024-01-26 NOTE — Progress Notes (Signed)
 Patient ID: CAYLOR HECKAMAN, female    DOB: [birth year], 53 y.o.   MRN: [id number]  This visit was conducted in person.  BP 118/78   Pulse 67   Temp 98 F (36.7 C) (Oral)   Ht 5\' 7"  (1.702 m)   Wt 160 lb 3.2 oz (72.7 kg)   LMP 07/03/2018   SpO2 97%   BMI 25.09 kg/m    CC:  Chief Complaint  Patient presents with   Annual Exam    Subjective:   HPI: Kinga Flathers Villamar is a 53 y.o. female presenting on 01/26/2024 for Annual Exam  History of Sjogren's disease, fibromyalgia MDD  and generalized anxiety disorder.. followed by Dr. Alvira Josephs  Well-controlled  depression on Lexapro  20 mg p.o. daily, still with anxiety and menopausal symptoms. Flowsheet Row Office Visit from 11/18/2023 in [facility]  PHQ-2 Total Score 0        Exercise: trying to get 10,000 steps a day, hand weights.  Diet: moderate  Wt Readings from Last 3 Encounters:  01/26/24 160 lb 3.2 oz (72.7 kg)  11/25/23 165 lb 9.6 oz (75.1 kg)  11/18/23 166 lb 4 oz (75.4 kg)  Body mass index is 25.09 kg/m.    Has history of SVT.Aaron Aas Using metoprolol  mg  1/2 tab daily    Elevated Cholesterol:  due for re-eval. Lab Results  Component Value Date   CHOL 219 (H) 06/10/2022   HDL 57.00 06/10/2022   LDLCALC 146 (H) 06/10/2022   TRIG 79.0 06/10/2022   CHOLHDL 4 06/10/2022  The 10-year ASCVD risk score (Arnett DK, et al., 2019) is: 1.4%   Values used to calculate the score:     Age: 11 years     Sex: Female     Is Non-Hispanic African American: No     Diabetic: No     Tobacco smoker: No     Systolic Blood Pressure: 118 mmHg     Is BP treated: No     HDL Cholesterol: 57 mg/dL     Total Cholesterol: 219 mg/dL   GERD: moderate control on pantoprazole  40 mg daily.   Paroxsysmal Afib: Reviewed last office visit from November 25, 2023 with Minnie Amber, PA cardiology Rate controlled with metoprolol  and A-fib burden controlled on flecainide . CHA2DS2-VASc score 1  Dr. Alvenia Aus  Relevant  past medical, surgical, family and social history reviewed and updated as indicated. Interim medical history since our last visit reviewed. Allergies and medications reviewed and updated. Outpatient Medications Prior to Visit  Medication Sig Dispense Refill   acetaminophen  (TYLENOL ) 500 MG tablet Take 1,000 mg by mouth as needed for moderate pain (pain score 4-6) or headache.     aspirin-acetaminophen -caffeine  (EXCEDRIN MIGRAINE) 250-250-65 MG tablet Take 1 tablet by mouth as needed for headache or migraine.     BIOTIN PO Take 1 tablet by mouth in the morning. Biotin 10,000 mcg + Calcium      calcium  carbonate (TUMS - DOSED IN MG ELEMENTAL CALCIUM ) 500 MG chewable tablet Chew 500 mg by mouth as needed for indigestion or heartburn.     Carboxymethylcellul-Glycerin (LUBRICATING EYE DROPS OP) Place 1 drop into both eyes daily as needed (dry eyes).     cholecalciferol (VITAMIN D ) 1000 UNITS tablet Take 1,000 Units by mouth every evening.     diphenhydramine-acetaminophen  (TYLENOL  PM) 25-500 MG TABS tablet Take 1 tablet by mouth at bedtime as needed (sleep).      escitalopram  (LEXAPRO ) 20 MG tablet  TAKE 1 TABLET BY MOUTH DAILY 90 tablet 0   flecainide  (TAMBOCOR ) 50 MG tablet Take 1/2 tablet by mouth twice daily (Patient taking differently: Take 50 mg by mouth 2 (two) times daily.)     fluticasone (FLONASE) 50 MCG/ACT nasal spray Place 1 spray into both nostrils daily.     metoprolol  succinate (TOPROL -XL) 25 MG 24 hr tablet Take 0.5 tablets (12.5 mg total) by mouth daily.     pantoprazole  (PROTONIX ) 40 MG tablet TAKE 1 TABLET BY MOUTH DAILY 90 tablet 3   TART CHERRY PO Take 500 mg by mouth daily.     Turmeric (QC TUMERIC COMPLEX PO) Take 1 tablet by mouth 2 (two) times daily. Tumeric ginger complex - 6000 mcg daily     amoxicillin -clavulanate (AUGMENTIN ) 875-125 MG tablet Take 1 tablet by mouth 2 (two) times daily. 20 tablet 0   No facility-administered medications prior to visit.     Per HPI unless  specifically indicated in ROS section below Review of Systems  Constitutional:  Negative for fatigue and fever.  HENT:  Negative for congestion.   Eyes:  Negative for pain.  Respiratory:  Negative for cough and shortness of breath.   Cardiovascular:  Negative for chest pain, palpitations and leg swelling.  Gastrointestinal:  Negative for abdominal pain.  Genitourinary:  Negative for dysuria and vaginal bleeding.  Musculoskeletal:  Negative for back pain.  Neurological:  Negative for syncope, light-headedness and headaches.  Psychiatric/Behavioral:  Negative for dysphoric mood.    Objective:  BP 118/78   Pulse 67   Temp 98 F (36.7 C) (Oral)   Ht 5\' 7"  (1.702 m)   Wt 160 lb 3.2 oz (72.7 kg)   LMP 07/03/2018   SpO2 97%   BMI 25.09 kg/m   Wt Readings from Last 3 Encounters:  01/26/24 160 lb 3.2 oz (72.7 kg)  11/25/23 165 lb 9.6 oz (75.1 kg)  11/18/23 166 lb 4 oz (75.4 kg)      Physical Exam Vitals and nursing note reviewed.  Constitutional:      General: She is not in acute distress.    Appearance: Normal appearance. She is well-developed. She is not ill-appearing or toxic-appearing.  HENT:     Head: Normocephalic.     Right Ear: Hearing, tympanic membrane, ear canal and external ear normal.     Left Ear: Hearing, tympanic membrane, ear canal and external ear normal.     Nose: Nose normal.  Eyes:     General: Lids are normal. Lids are everted, no foreign bodies appreciated.     Conjunctiva/sclera: Conjunctivae normal.     Pupils: Pupils are equal, round, and reactive to light.  Neck:     Thyroid : No thyroid  mass or thyromegaly.     Vascular: No carotid bruit.     Trachea: Trachea normal.  Cardiovascular:     Rate and Rhythm: Normal rate and regular rhythm.     Heart sounds: Normal heart sounds, S1 normal and S2 normal. No murmur heard.    No gallop.  Pulmonary:     Effort: Pulmonary effort is normal. No respiratory distress.     Breath sounds: Normal breath sounds.  No wheezing, rhonchi or rales.  Abdominal:     General: Bowel sounds are normal. There is no distension or abdominal bruit.     Palpations: Abdomen is soft. There is no fluid wave or mass.     Tenderness: There is no abdominal tenderness. There is no guarding or rebound.  Hernia: No hernia is present.  Musculoskeletal:     Cervical back: Normal range of motion and neck supple.  Lymphadenopathy:     Cervical: No cervical adenopathy.  Skin:    General: Skin is warm and dry.     Findings: No rash.  Neurological:     Mental Status: She is alert.     Cranial Nerves: No cranial nerve deficit.     Sensory: No sensory deficit.  Psychiatric:        Mood and Affect: Mood is not anxious or depressed.        Speech: Speech normal.        Behavior: Behavior normal. Behavior is cooperative.        Judgment: Judgment normal.       Results for orders placed or performed in visit on 10/15/23  POC Influenza A&B (Binax test)   Collection Time: 10/15/23 11:22 AM  Result Value Ref Range   Influenza A, POC Positive (A) Negative   Influenza B, POC Negative Negative  POC COVID-19   Collection Time: 10/15/23 11:23 AM  Result Value Ref Range   SARS Coronavirus 2 Ag Negative Negative     COVID 19 screen:  No recent travel or known exposure to COVID19 The patient denies respiratory symptoms of COVID 19 at this time. The importance of social distancing was discussed today.   Assessment and Plan   The patient's preventative maintenance and recommended screening tests for an annual wellness exam were reviewed in full today. Brought up to date unless services declined.  Counselled on the importance of diet, exercise, and its role in overall health and mortality. The patient's FH and SH was reviewed, including their home life, tobacco status, and drug and alcohol status.   Vaccines: due for covid vaccine, Tetanus, shingrix and flu vaccine.. not interested in Pap/DVE:  Dr. Duke Gibbons GYN... last  02/2021, s/p hysterectomy for fibroids. Mammo:  07/2023 Bone Density: Colon:  Cologuard negative 12/2022, repeat 2027  Smoking Status: none ETOH/ drug use: rare/none  Hep C:  done  HIV screen:   refused  Problem List Items Addressed This Visit     Hypercholesterolemia   Due for reevaluation      Relevant Orders   Comprehensive metabolic panel with GFR   Lipid panel   MDD (major depressive disorder), recurrent episode (HCC)   Stable, chronic.  Continue current medication.   Lexapro  20 mg p.o. daily      Multiple thyroid  nodules    Followed by specialist with yearly imaging.      Relevant Orders   TSH   T4, free   T3, free   Paroxysmal atrial fibrillation (HCC)    Reviewed last office visit from November 25, 2023 with Minnie Amber, PA cardiology Rate controlled with metoprolol  and A-fib burden controlled on flecainide . CHA2DS2-VASc score 1       Sjogren's disease (HCC)   Now seeing Dr. Alvira Josephs for rheumatology.      SVT (supraventricular tachycardia)    Chronic, controlled with metoprolol  low dose daily.,      Vitamin D  deficiency   Due for reevaluation      Relevant Orders   VITAMIN D  25 Hydroxy (Vit-D Deficiency, Fractures)   Other Visit Diagnoses       Routine general medical examination at a health care facility    -  Primary     Other fatigue       Relevant Orders   Vitamin B12  Form completed at [facility] for insurance. Held to await lab entry.  Herby Lolling, MD

## 2024-01-26 NOTE — Assessment & Plan Note (Addendum)
 Reviewed last office visit from November 25, 2023 with Minnie Amber, PA cardiology Rate controlled with metoprolol  and A-fib burden controlled on flecainide . CHA2DS2-VASc score 1

## 2024-01-26 NOTE — Assessment & Plan Note (Signed)
Followed by specialist with yearly imaging.

## 2024-01-26 NOTE — Telephone Encounter (Signed)
 Reached out to patient to advise that the biometric screening form has been faxed.

## 2024-01-26 NOTE — Assessment & Plan Note (Signed)
Stable, chronic.  Continue current medication.   Lexapro 20 mg p.o. daily

## 2024-01-27 ENCOUNTER — Other Ambulatory Visit: Payer: Self-pay | Admitting: Surgery

## 2024-01-27 ENCOUNTER — Encounter: Payer: Self-pay | Admitting: Surgery

## 2024-01-27 DIAGNOSIS — E041 Nontoxic single thyroid nodule: Secondary | ICD-10-CM

## 2024-01-31 ENCOUNTER — Ambulatory Visit
Admission: RE | Admit: 2024-01-31 | Discharge: 2024-01-31 | Disposition: A | Discharge status: Home / Self Care | Source: Ambulatory Visit | Attending: Surgery | Admitting: Surgery

## 2024-01-31 DIAGNOSIS — E042 Nontoxic multinodular goiter: Secondary | ICD-10-CM | POA: Diagnosis not present

## 2024-01-31 DIAGNOSIS — E041 Nontoxic single thyroid nodule: Secondary | ICD-10-CM

## 2024-02-11 NOTE — Progress Notes (Signed)
 Office Visit Note  Patient: Jill Leonard             Date of Birth: 09-19-1970           MRN: [id number]             PCP: Judithann Novas, MD Referring: Judithann Novas, MD Visit Date: 02/25/2024 Occupation: @GUAROCC @  Subjective:  Dry mouth and dry eyes  History of Present Illness: Jill Leonard is a 53 y.o. female with Sjogren's, osteoarthritis and fibromyalgia syndrome.  She returns today after her last visit in December 2024.  She states she continues to have dry mouth and dry eyes.  She has tried several over-the-counter products and they are not effective.  She was diagnosed with atrial fibrillation and was started on flecainide .  Patient states she experiences some hair loss and headaches with the medication.  She has been walking 3 miles daily and has been experiencing some discomfort in her hips.  She states does not limit her from doing her activities.  She denies any history of shortness of breath or lymphadenopathy.    Activities of Daily Living:  Patient reports morning stiffness for 4 hours.   Patient Reports nocturnal pain.  Difficulty dressing/grooming: Denies Difficulty climbing stairs: Reports Difficulty getting out of chair: Reports Difficulty using hands for taps, buttons, cutlery, and/or writing: Reports  Review of Systems  Constitutional:  Positive for fatigue.  HENT:  Positive for mouth dryness. Negative for mouth sores.   Eyes:  Positive for dryness.  Respiratory:  Negative for shortness of breath.   Cardiovascular:  Positive for palpitations. Negative for chest pain.  Gastrointestinal:  Negative for blood in stool, constipation and diarrhea.  Endocrine: Negative for increased urination.  Genitourinary:  Negative for involuntary urination.  Musculoskeletal:  Positive for joint pain, joint pain, myalgias, muscle weakness, morning stiffness, muscle tenderness and myalgias. Negative for gait problem and joint swelling.  Skin:  Positive for hair loss.  Negative for color change, rash and sensitivity to sunlight.  Allergic/Immunologic: Positive for susceptible to infections.  Neurological:  Positive for headaches. Negative for dizziness.  Hematological:  Negative for swollen glands.  Psychiatric/Behavioral:  Positive for depressed mood and sleep disturbance. The patient is nervous/anxious.     PMFS History:  Patient Active Problem List   Diagnosis Date Noted   Paroxysmal atrial fibrillation (HCC) 08/18/2023   Hypercholesterolemia 06/17/2022   Vitamin D  deficiency 06/17/2022   Multiple thyroid  nodules 01/01/2022   Solitary pulmonary nodule 11/06/2019   Subjective tinnitus of both ears 06/06/2018   Mixed conductive and sensorineural hearing loss of right ear with unrestricted hearing of left ear 06/06/2018   MDD (major depressive disorder), recurrent episode (HCC) 01/09/2018   Fibromyalgia 01/09/2018   Sjogren's disease (HCC) 01/09/2018   Generalized anxiety disorder 03/18/2016   SVT (supraventricular tachycardia) 03/05/2015   GERD 06/21/2008    Past Medical History:  Diagnosis Date   Anal fissure    Arthralgia of temporomandibular joint    MIGRAINES AND REGULAR   Atrial fibrillation (HCC)    per patient   Calculus of gallbladder without mention of cholecystitis or obstruction    Complication of anesthesia    Depression    Dysrhythmia    RAPID HEARTBEAT AND SKIPPED BEATS ON METOPROLOL    Family history of adverse reaction to anesthesia    FAMILY MEMBERS HAVE PONV   Fibromyalgia    GERD (gastroesophageal reflux disease)    Heart murmur    IN PAST, TOLD  IS GONE NOW   History of kidney stones    History of nephrolithiasis    HLD (hyperlipidemia)    PONV (postoperative nausea and vomiting)    Raynaud's disease    per patient   Sjogren - Larsson's syndrome    Sjogren's syndrome (HCC)     Family History  Problem Relation Age of Onset   Stroke Mother    Heart Problems Mother    Kidney failure Mother    Colon polyps  Father    Bone cancer Other        Grandmother   Diabetes Other        Grandmother   Heart disease Other        Grandmother   Healthy Son    Past Surgical History:  Procedure Laterality Date   ABDOMINAL HYSTERECTOMY     CHOLECYSTECTOMY  08/20/08   MANDIBLE SURGERY     VENTRAL HERNIA REPAIR N/A 06/12/2019   Procedure: VENTRAL HERNIA REPAIR WITH MESH;  Surgeon: Caralyn Chandler, MD;  Location: [facility] OR;  Service: General;  Laterality: N/A;   WISDOM TOOTH EXTRACTION     Social History   Social History Narrative   Married      Caffeine : 2 cups/day      No regular exercise          There is no immunization history on file for this patient.   Objective: Vital Signs: BP 116/78 (BP Location: Left Arm, Patient Position: Sitting, Cuff Size: Normal)   Pulse 61   Resp 14   Ht 5' 7 (1.702 m)   Wt 162 lb (73.5 kg)   LMP 07/03/2018   BMI 25.37 kg/m    Physical Exam Vitals and nursing note reviewed.  Constitutional:      Appearance: She is well-developed.  HENT:     Head: Normocephalic and atraumatic.   Eyes:     Conjunctiva/sclera: Conjunctivae normal.    Cardiovascular:     Rate and Rhythm: Normal rate and regular rhythm.     Heart sounds: Normal heart sounds.  Pulmonary:     Effort: Pulmonary effort is normal.     Breath sounds: Normal breath sounds.  Abdominal:     General: Bowel sounds are normal.     Palpations: Abdomen is soft.   Musculoskeletal:     Cervical back: Normal range of motion.  Lymphadenopathy:     Cervical: No cervical adenopathy.   Skin:    General: Skin is warm and dry.     Capillary Refill: Capillary refill takes less than 2 seconds.   Neurological:     Mental Status: She is alert and oriented to person, place, and time.   Psychiatric:        Behavior: Behavior normal.      Musculoskeletal Exam: Cervical, thoracic and lumbar spine with good range of motion.  Shoulders, elbows, wrist joints, MCPs PIPs and DIPs were in good range of  motion.  PIP and DIP prominence with no synovitis was noted.  Hip joints were in good range of motion with some discomfort.  Knee joints were in good range of motion without any warmth swelling or effusion.  There was no tenderness over ankles or MTPs.  CDAI Exam: CDAI Score: -- Patient Global: --; Provider Global: -- Swollen: --; Tender: -- Joint Exam 02/25/2024   No joint exam has been documented for this visit   There is currently no information documented on the homunculus. Go to the Rheumatology activity and complete  the homunculus joint exam.  Investigation: No additional findings.  Imaging: US  THYROID  Result Date: 02/02/2024 CLINICAL DATA:  Prior ultrasound follow-up. TI-RADS category 4 nodule in the thyroid  isthmus currently under imaging surveillance. Patient previously underwent biopsy of nodules in the right mid and left inferior thyroid  gland in May of 2021. EXAM: THYROID  ULTRASOUND TECHNIQUE: Ultrasound examination of the thyroid  gland and adjacent soft tissues was performed. COMPARISON:  Multiple prior thyroid  nodules including 12/29/2022 and 12/05/2027 FINDINGS: Parenchymal Echotexture: Mildly heterogenous Isthmus: 0.4 cm Right lobe: 4.9 x 2.0 x 2.0 cm Left lobe: 4.8 x 1.4 x 1.6 cm _________________________________________________________ Estimated total number of nodules >/= 1 cm: 4 Number of spongiform nodules >/=  2 cm not described below (TR1): 0 Number of mixed cystic and solid nodules >/= 1.5 cm not described below (TR2): 0 _________________________________________________________ Nodule # 1: Continued stability of hypoechoic solid nodule in the thyroid  isthmus at 1.1 x 0.7 x 0.6 cm. This study confirms stability dating back to March of 2021. Findings remain consistent with TI-RADS category 4. *Given size (>/= 1 - 1.4 cm) and appearance, a follow-up ultrasound in 1 year should be considered based on TI-RADS criteria. Nodule # 3: Previously biopsied nodule in the right mid gland  is unchanged at 1.4 x 1.2 x 0.8 cm. Nodule # 4: Simple cyst in the right mid gland measures up to 1.4 cm. TI-RADS category 2. This nodule does NOT meet TI-RADS criteria for biopsy or dedicated follow-up. Nodule # 5: Previously biopsied nodule in the right lower gland measures 1.2 x 0.9 x 0.8 cm, unchanged. IMPRESSION: 1. No interval change in the size or appearance of the TI-RADS category 4 nodule in the thyroid  isthmus. This exam confirms 4 years of stability. Recommend 1 additional follow-up ultrasound in 1 year to confirm 5 years of stability and thus benignity. 2. No interval change in the size or appearance of previously biopsied nodules in the right mid and right inferior gland. 3. No new nodules or suspicious features. The above is in keeping with the ACR TI-RADS recommendations - J Am Coll Radiol 2017;14:587-595. Electronically Signed   By: Fernando Hoyer M.D.   On: 02/02/2024 08:53    Recent Labs: Lab Results  Component Value Date   WBC 4.2 08/26/2023   HGB 14.3 08/26/2023   PLT 288 08/26/2023   NA 140 01/26/2024   K 4.1 01/26/2024   CL 104 01/26/2024   CO2 29 01/26/2024   GLUCOSE 81 01/26/2024   BUN 15 01/26/2024   CREATININE 0.84 01/26/2024   BILITOT 0.5 01/26/2024   ALKPHOS 59 01/26/2024   AST 25 01/26/2024   ALT 19 01/26/2024   PROT 7.9 01/26/2024   ALBUMIN  4.5 01/26/2024   CALCIUM  9.3 01/26/2024   GFRAA >60 05/21/2020    Speciality Comments: Plaquenil x 3 years-frequent infections  Procedures:  No procedures performed Allergies: Hydrocodone -guaifenesin, Sulfonamide derivatives, Trazodone  and nefazodone, and Latex   Assessment / Plan:     Visit Diagnoses: Sjogren's syndrome with other organ involvement (HCC) - Positive ANA, positive Ro, positive RNP , sicca dxd by Dr. Zimniski 20 years ago.ttd by Dr. Ebbie Goldmann and Dr. Henrine Logan. -Patient reports ongoing symptoms of dry mouth and dry eyes.  She has been using over-the-counter products which helped to some extent.  She states  she tried some other products which were discussed at the last visit and did not like them.  August 26, 2023 UA negative, complements normal, ANA 1: 1280 NS, dsDNA negative, sed rate 33, SSA  greater than 8.0, RNP positive.  She complains of fatigue, sicca symptoms, arthralgias, and mild Raynaud's symptoms during the winter months.  Her labs have been stable.  Will recheck labs today.  Plan: Protein / creatinine ratio, urine, CBC with Differential/Platelet, Sedimentation rate, Anti-DNA antibody, double-stranded, C3 and C4, Serum protein electrophoresis with reflex, Rheumatoid factor  Primary osteoarthritis of both hands-she continues to have some stiffness in her hands.  No synovitis was noted.  She has been using over-the-counter supplements which helped.  Trochanteric bursitis of both hips-she is of discomfort in her hips after prolonged walking.  She did not have any point tenderness in her trochanteric region.  The pain is manageable.  Fibromyalgia-she is to have some generalized pain and discomfort from fibromyalgia.  She has been doing stretching exercises and walking on a regular basis.  Other medical problems listed as follows:  Paroxysmal atrial fibrillation (HCC) - Patient was started on flecainide .  She has been experiencing hair loss and headaches from flecainide .  Hypercholesterolemia  Shortness of breath - PFTs in the past were unremarkable and CT scan of the chest was stable.  Solitary pulmonary nodule - She was evaluated by Dr. Baldwin Levee.  CT scan June 2023.  Patient gives history of shortness of breath only on exertion.  January 2022 PFTs were normal.  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   Follow-Up Instructions: Return in about 6 months (around 08/26/2024) for Sjogren's, Osteoarthritis.   Nicholas Bari, MD  Note - This record has been created using Animal nutritionist.  Chart creation errors have been sought, but may not always  have been located. Such creation errors do not reflect on  the standard of medical care.

## 2024-02-16 DIAGNOSIS — E042 Nontoxic multinodular goiter: Secondary | ICD-10-CM | POA: Diagnosis not present

## 2024-02-25 ENCOUNTER — Ambulatory Visit: Payer: BC Managed Care – PPO | Attending: Rheumatology | Admitting: Rheumatology

## 2024-02-25 ENCOUNTER — Encounter: Payer: Self-pay | Admitting: Rheumatology

## 2024-02-25 VITALS — BP 116/78 | HR 61 | Resp 14 | Ht 67.0 in | Wt 162.0 lb

## 2024-02-25 DIAGNOSIS — I480 Paroxysmal atrial fibrillation: Secondary | ICD-10-CM

## 2024-02-25 DIAGNOSIS — E559 Vitamin D deficiency, unspecified: Secondary | ICD-10-CM

## 2024-02-25 DIAGNOSIS — M7062 Trochanteric bursitis, left hip: Secondary | ICD-10-CM

## 2024-02-25 DIAGNOSIS — M19041 Primary osteoarthritis, right hand: Secondary | ICD-10-CM

## 2024-02-25 DIAGNOSIS — M797 Fibromyalgia: Secondary | ICD-10-CM | POA: Diagnosis not present

## 2024-02-25 DIAGNOSIS — H9071 Mixed conductive and sensorineural hearing loss, unilateral, right ear, with unrestricted hearing on the contralateral side: Secondary | ICD-10-CM

## 2024-02-25 DIAGNOSIS — E042 Nontoxic multinodular goiter: Secondary | ICD-10-CM

## 2024-02-25 DIAGNOSIS — M3509 Sicca syndrome with other organ involvement: Secondary | ICD-10-CM | POA: Diagnosis not present

## 2024-02-25 DIAGNOSIS — R0602 Shortness of breath: Secondary | ICD-10-CM

## 2024-02-25 DIAGNOSIS — F419 Anxiety disorder, unspecified: Secondary | ICD-10-CM

## 2024-02-25 DIAGNOSIS — R911 Solitary pulmonary nodule: Secondary | ICD-10-CM

## 2024-02-25 DIAGNOSIS — M7061 Trochanteric bursitis, right hip: Secondary | ICD-10-CM

## 2024-02-25 DIAGNOSIS — F32A Depression, unspecified: Secondary | ICD-10-CM

## 2024-02-25 DIAGNOSIS — E7800 Pure hypercholesterolemia, unspecified: Secondary | ICD-10-CM

## 2024-02-25 DIAGNOSIS — M19042 Primary osteoarthritis, left hand: Secondary | ICD-10-CM

## 2024-02-25 DIAGNOSIS — H9313 Tinnitus, bilateral: Secondary | ICD-10-CM

## 2024-02-25 DIAGNOSIS — K219 Gastro-esophageal reflux disease without esophagitis: Secondary | ICD-10-CM

## 2024-02-27 ENCOUNTER — Ambulatory Visit: Payer: Self-pay | Admitting: Rheumatology

## 2024-02-27 NOTE — Progress Notes (Signed)
 Urine protein creatinine ratio normal, white cell count is low at 3.7, sed rate normal, rheumatoid factor raynauds phenomenon positive, double-stranded DNA negative, C3-C4 normal, SPEP pending.  Do not indicate an autoimmune disease flare.  Will repeat labs prior to the next visit.

## 2024-02-29 LAB — CBC WITH DIFFERENTIAL/PLATELET
Absolute Lymphocytes: 1299 {cells}/uL (ref 850–3900)
Absolute Monocytes: 352 {cells}/uL (ref 200–950)
Basophils Absolute: 30 {cells}/uL (ref 0–200)
Basophils Relative: 0.8 %
Eosinophils Absolute: 111 {cells}/uL (ref 15–500)
Eosinophils Relative: 3 %
HCT: 44.5 % (ref 35.0–45.0)
Hemoglobin: 14.5 g/dL (ref 11.7–15.5)
MCH: 28.1 pg (ref 27.0–33.0)
MCHC: 32.6 g/dL (ref 32.0–36.0)
MCV: 86.2 fL (ref 80.0–100.0)
MPV: 10.2 fL (ref 7.5–12.5)
Monocytes Relative: 9.5 %
Neutro Abs: 1909 {cells}/uL (ref 1500–7800)
Neutrophils Relative %: 51.6 %
Platelets: 270 10*3/uL (ref 140–400)
RBC: 5.16 10*6/uL — ABNORMAL HIGH (ref 3.80–5.10)
RDW: 12.9 % (ref 11.0–15.0)
Total Lymphocyte: 35.1 %
WBC: 3.7 10*3/uL — ABNORMAL LOW (ref 3.8–10.8)

## 2024-02-29 LAB — PROTEIN ELECTROPHORESIS, SERUM, WITH REFLEX
Albumin ELP: 4.8 g/dL (ref 3.8–4.8)
Alpha 1: 0.2 g/dL (ref 0.2–0.3)
Alpha 2: 0.6 g/dL (ref 0.5–0.9)
Beta 2: 0.5 g/dL (ref 0.2–0.5)
Beta Globulin: 0.5 g/dL (ref 0.4–0.6)
Gamma Globulin: 1.7 g/dL (ref 0.8–1.7)
Total Protein: 8.3 g/dL — ABNORMAL HIGH (ref 6.1–8.1)

## 2024-02-29 LAB — PROTEIN / CREATININE RATIO, URINE
Creatinine, Urine: 17 mg/dL — ABNORMAL LOW (ref 20–275)
Total Protein, Urine: <4 mg/dL — ABNORMAL LOW (ref 5–24)

## 2024-02-29 LAB — SEDIMENTATION RATE: Sed Rate: 22 mm/h (ref 0–30)

## 2024-02-29 LAB — RHEUMATOID FACTOR: Rheumatoid fact SerPl-aCnc: 120 [IU]/mL — ABNORMAL HIGH (ref <14)

## 2024-02-29 LAB — C3 AND C4
C3 Complement: 140 mg/dL (ref 83–193)
C4 Complement: 20 mg/dL (ref 15–57)

## 2024-02-29 LAB — ANTI-DNA ANTIBODY, DOUBLE-STRANDED: ds DNA Ab: <1 [IU]/mL

## 2024-02-29 NOTE — Progress Notes (Signed)
 Urine protein creatinine ratio normal, white cell count is mildly decreased.  SPEP normal, rheumatoid factor positive, sed rate normal, double-stranded DNA negative complements normal.  Labs do not indicate an autoimmune disease flare.  No change in treatment advised.

## 2024-03-01 ENCOUNTER — Other Ambulatory Visit: Payer: Self-pay | Admitting: Family Medicine

## 2024-03-01 DIAGNOSIS — F32A Depression, unspecified: Secondary | ICD-10-CM

## 2024-03-07 ENCOUNTER — Encounter: Payer: Self-pay | Admitting: Cardiovascular Disease

## 2024-03-19 ENCOUNTER — Encounter: Payer: Self-pay | Admitting: Cardiovascular Disease

## 2024-03-19 ENCOUNTER — Encounter: Payer: Self-pay | Admitting: Family Medicine

## 2024-03-19 DIAGNOSIS — L659 Nonscarring hair loss, unspecified: Secondary | ICD-10-CM

## 2024-03-19 DIAGNOSIS — E042 Nontoxic multinodular goiter: Secondary | ICD-10-CM

## 2024-03-19 DIAGNOSIS — R232 Flushing: Secondary | ICD-10-CM

## 2024-03-21 ENCOUNTER — Other Ambulatory Visit: Payer: Self-pay | Admitting: Cardiovascular Disease

## 2024-03-21 DIAGNOSIS — R002 Palpitations: Secondary | ICD-10-CM

## 2024-03-28 ENCOUNTER — Ambulatory Visit: Admitting: Cardiovascular Disease

## 2024-04-11 ENCOUNTER — Telehealth: Payer: Self-pay | Admitting: Cardiovascular Disease

## 2024-04-11 NOTE — Telephone Encounter (Signed)
 Patient would like to do a provider switch. Please advise

## 2024-04-11 NOTE — Telephone Encounter (Signed)
 Fine with me

## 2024-04-12 NOTE — Telephone Encounter (Signed)
Sure.   ST

## 2024-04-18 ENCOUNTER — Ambulatory Visit: Admitting: Cardiovascular Disease

## 2024-04-27 ENCOUNTER — Telehealth: Admitting: Family Medicine

## 2024-04-27 VITALS — BP 128/76 | HR 80 | Ht 67.0 in | Wt 161.0 lb

## 2024-04-27 DIAGNOSIS — U071 COVID-19: Secondary | ICD-10-CM | POA: Diagnosis not present

## 2024-04-27 NOTE — Assessment & Plan Note (Signed)
 COVID19  Infection < 5 days from onset of symptoms in  vaccinated overweight individual with history of afib on flacanide  No clear sign of bacterial infection at this time.   No SOB.  No red flags/need for ER visit or in-person exam at respiratory clinic at this time..    Pt higher risk for COVID complications given  hear issues. Paxlovid is contraindicated with flecainide .  Reviewed possible use of molnupiravir but given she is not severely ill and doing well overall she has decided to decline antiviral treatment.   Symptomatic care with mucinex and cough suppressant at night. If SOB begins symptoms worsening.. have low threshold for in-person exam, if severe shortness of breath ER visit recommended.  Can monitor Oxygen saturation at home with home monitor if able to obtain.  Go to ER if O2 sat < 90% on room air.   Reviewed home care and provided information through MyChart.  Recommended quarantine 5 days isolation recommended. Return to work day 6 and wear mask for 4 more days to complete 10 days. Provided info about prevention of spread of COVID 19.

## 2024-04-27 NOTE — Progress Notes (Signed)
 VIRTUAL VISIT A virtual visit is felt to be most appropriate for this patient at this time.   I connected with the patient on 04/27/24 at  2:20 PM EDT by virtual telehealth platform and verified that I am speaking with the correct person using two identifiers.   I discussed the limitations, risks, security and privacy concerns of performing an evaluation and management service by  virtual telehealth platform and the availability of in person appointments. I also discussed with the patient that there may be a patient responsible charge related to this service. The patient expressed understanding and agreed to proceed.  Patient location: Home Provider Location:  [place] Participants: Greig Ring and Darrelyn LITTIE Louder   Chief Complaint  Patient presents with   Covid Positive    Symptoms started 3 days ago Tested Positive today Husband + for Covid as well    Cough   Sore Throat   Ear Pressure   Fever    History of Present Illness:  53 y.o. female patient of Nasim Habeeb E, MD with history of Sjogren's disease and paroxysmal atrial fibrillation presents with COVID  She reports that 3 days ago she started having cough, sore throat, ear pressure and fever.  She tested positive for COVID today.  Sick contacts include her husband who is positive for COVID as well.  Tickle cough at night.  Mild SOB with talking.  Using tylenol  for fever.  Eating and drinking well.  She has increased risk for complications from COVID given cardiac history and Sjogren's disease.  She does not have any history of chronic lung disease.  Paxlovid contraindicated on flecainide .   Review of Systems  Constitutional:  Negative for chills and fever.  HENT:  Negative for congestion and ear pain.   Eyes:  Negative for pain and redness.  Respiratory:  Negative for cough and shortness of breath.   Cardiovascular:  Negative for chest pain, palpitations and leg swelling.  Gastrointestinal:  Negative  for abdominal pain, blood in stool, constipation, diarrhea, nausea and vomiting.  Genitourinary:  Negative for dysuria.  Musculoskeletal:  Negative for falls and myalgias.  Skin:  Negative for rash.  Neurological:  Negative for dizziness.  Psychiatric/Behavioral:  Negative for depression. The patient is not nervous/anxious.       Past Medical History:  Diagnosis Date   Anal fissure    Arthralgia of temporomandibular joint    MIGRAINES AND REGULAR   Atrial fibrillation (HCC)    per patient   Calculus of gallbladder without mention of cholecystitis or obstruction    Complication of anesthesia    Depression    Dysrhythmia    RAPID HEARTBEAT AND SKIPPED BEATS ON METOPROLOL    Family history of adverse reaction to anesthesia    FAMILY MEMBERS HAVE PONV   Fibromyalgia    GERD (gastroesophageal reflux disease)    Heart murmur    IN PAST, TOLD IS GONE NOW   History of kidney stones    History of nephrolithiasis    HLD (hyperlipidemia)    PONV (postoperative nausea and vomiting)    Raynaud's disease    per patient   Sjogren - Larsson's syndrome    Sjogren's syndrome (HCC)     reports that she has never smoked. She has been exposed to tobacco smoke. She has never used smokeless tobacco. She reports current alcohol use. She reports that she does not use drugs.   Current Outpatient Medications:    acetaminophen  (TYLENOL ) 500 MG tablet, Take 1,000 mg  by mouth as needed for moderate pain (pain score 4-6) or headache., Disp: , Rfl:    aspirin-acetaminophen -caffeine  (EXCEDRIN MIGRAINE) 250-250-65 MG tablet, Take 1 tablet by mouth as needed for headache or migraine., Disp: , Rfl:    BIOTIN PO, Take 1 tablet by mouth in the morning. Biotin 10,000 mcg + Calcium , Disp: , Rfl:    calcium  carbonate (TUMS - DOSED IN MG ELEMENTAL CALCIUM ) 500 MG chewable tablet, Chew 500 mg by mouth as needed for indigestion or heartburn., Disp: , Rfl:    Carboxymethylcellul-Glycerin (LUBRICATING EYE DROPS OP),  Place 1 drop into both eyes daily as needed (dry eyes)., Disp: , Rfl:    cholecalciferol (VITAMIN D ) 1000 UNITS tablet, Take 1,000 Units by mouth every evening., Disp: , Rfl:    diphenhydramine-acetaminophen  (TYLENOL  PM) 25-500 MG TABS tablet, Take 1 tablet by mouth at bedtime as needed (sleep). , Disp: , Rfl:    escitalopram  (LEXAPRO ) 20 MG tablet, TAKE 1 TABLET BY MOUTH DAILY, Disp: 90 tablet, Rfl: 3   flecainide  (TAMBOCOR ) 50 MG tablet, Take 1/2 tablet by mouth twice daily, Disp: , Rfl:    fluticasone (FLONASE) 50 MCG/ACT nasal spray, Place 1 spray into both nostrils daily., Disp: , Rfl:    metoprolol  succinate (TOPROL -XL) 25 MG 24 hr tablet, Take 0.5 tablets (12.5 mg total) by mouth daily., Disp: 45 tablet, Rfl: 3   Omega-3 Fatty Acids (FISH OIL PO), Take by mouth., Disp: , Rfl:    pantoprazole  (PROTONIX ) 40 MG tablet, TAKE 1 TABLET BY MOUTH DAILY, Disp: 90 tablet, Rfl: 3   TART CHERRY PO, Take 500 mg by mouth daily., Disp: , Rfl:    Turmeric (QC TUMERIC COMPLEX PO), Take 1 tablet by mouth 2 (two) times daily. Tumeric ginger complex - 6000 mcg daily, Disp: , Rfl:    Observations/Objective: Blood pressure 128/76, pulse 80, height 5' 7 (1.702 m), weight 161 lb (73 kg), last menstrual period 07/03/2018.  Physical Exam Constitutional:      General: The patient is not in acute distress. Pulmonary:     Effort: Pulmonary effort is normal. No respiratory distress.  Neurological:     Mental Status: The patient is alert and oriented to person, place, and time.  Psychiatric:        Mood and Affect: Mood normal.        Behavior: Behavior normal.    Assessment and Plan COVID-19 Assessment & Plan: COVID19  Infection < 5 days from onset of symptoms in  vaccinated overweight individual with history of afib on flacanide  No clear sign of bacterial infection at this time.   No SOB.  No red flags/need for ER visit or in-person exam at respiratory clinic at this time..    Pt higher risk for  COVID complications given  hear issues. Paxlovid is contraindicated with flecainide .  Reviewed possible use of molnupiravir but given she is not severely ill and doing well overall she has decided to decline antiviral treatment.   Symptomatic care with mucinex and cough suppressant at night. If SOB begins symptoms worsening.. have low threshold for in-person exam, if severe shortness of breath ER visit recommended.  Can monitor Oxygen saturation at home with home monitor if able to obtain.  Go to ER if O2 sat < 90% on room air.   Reviewed home care and provided information through MyChart.  Recommended quarantine 5 days isolation recommended. Return to work day 6 and wear mask for 4 more days to complete 10 days. Provided info  about prevention of spread of COVID 19.        I discussed the assessment and treatment plan with the patient. The patient was provided an opportunity to ask questions and all were answered. The patient agreed with the plan and demonstrated an understanding of the instructions.   The patient was advised to call back or seek an in-person evaluation if the symptoms worsen or if the condition fails to improve as anticipated.     Greig Ring, MD

## 2024-05-09 ENCOUNTER — Ambulatory Visit: Admitting: Cardiovascular Disease

## 2024-05-28 ENCOUNTER — Other Ambulatory Visit (HOSPITAL_COMMUNITY): Payer: Self-pay | Admitting: Internal Medicine

## 2024-06-01 ENCOUNTER — Encounter (HOSPITAL_COMMUNITY): Payer: Self-pay | Admitting: Internal Medicine

## 2024-06-01 ENCOUNTER — Ambulatory Visit (HOSPITAL_COMMUNITY)
Admission: RE | Admit: 2024-06-01 | Discharge: 2024-06-01 | Disposition: A | Discharge status: Home / Self Care | Source: Ambulatory Visit | Attending: Internal Medicine | Admitting: Internal Medicine

## 2024-06-01 VITALS — BP 114/88 | HR 65 | Ht 67.0 in | Wt 162.0 lb

## 2024-06-01 DIAGNOSIS — Z79899 Other long term (current) drug therapy: Secondary | ICD-10-CM | POA: Diagnosis not present

## 2024-06-01 DIAGNOSIS — I480 Paroxysmal atrial fibrillation: Secondary | ICD-10-CM

## 2024-06-01 DIAGNOSIS — Z5181 Encounter for therapeutic drug level monitoring: Secondary | ICD-10-CM

## 2024-06-01 NOTE — Progress Notes (Signed)
 Primary Care Physician: Avelina Greig BRAVO, MD Primary Cardiologist: Deatrice Cage, MD Electrophysiologist: None     Referring Physician: Dr. Cage Darrelyn Leonard Jill is a 53 y.o. female with a history of Sjogren's syndrome, HLD, borderline MVP, and paroxysmal atrial fibrillation who presents for consultation in the [facility]. She contacted office noting increased Afib burden recently. She currently takes Toprol  25 mg daily. Patient is not on anticoagulation. She has a CHADS2VASC score of 1.  On evaluation today, she is currently in NSR. She has noted increased frequency of Afib in November and is interested in treatment for this. She has also noted increased frequency of tachycardia episodes as well. They typically occur at night and are very uncomfortable.   On follow up 11/25/23, patient is here for flecainide  surveillance. She is currently in NSR. Her burden of Afib has been overall well controlled on flecainide . She is currently taking augmentin  for a sinus infection. She is not on OAC due to low risk score.   On follow up 06/01/24, patient is here for flecainide  surveillance. She has had overall very low Afib burden since last office visit. She does note flecainide  was seeming to cause her headaches and hair loss, but this seems to have improved. She is not on anticoagulation due to low risk score.   Today, she denies symptoms of chest pain, shortness of breath, orthopnea, PND, lower extremity edema, dizziness, presyncope, syncope, snoring, daytime somnolence, bleeding, or neurologic sequela. The patient is tolerating medications without difficulties and is otherwise without complaint today.    Atrial Fibrillation Risk Factors:  she does not have symptoms or diagnosis of sleep apnea.   she has a BMI of Body mass index is 25.37 kg/m.SABRA Filed Weights   06/01/24 1529  Weight: 73.5 kg      Current Outpatient Medications  Medication Sig Dispense  Refill   acetaminophen  (TYLENOL ) 500 MG tablet Take 1,000 mg by mouth as needed for moderate pain (pain score 4-6) or headache.     aspirin-acetaminophen -caffeine  (EXCEDRIN MIGRAINE) 250-250-65 MG tablet Take 1 tablet by mouth as needed for headache or migraine.     BIOTIN PO Take 1 tablet by mouth in the morning. Biotin 10,000 mcg + Calcium      calcium  carbonate (TUMS - DOSED IN MG ELEMENTAL CALCIUM ) 500 MG chewable tablet Chew 500 mg by mouth as needed for indigestion or heartburn.     Carboxymethylcellul-Glycerin (LUBRICATING EYE DROPS OP) Place 1 drop into both eyes daily as needed (dry eyes).     cholecalciferol (VITAMIN D ) 1000 UNITS tablet Take 1,000 Units by mouth every evening.     diphenhydramine-acetaminophen  (TYLENOL  PM) 25-500 MG TABS tablet Take 1 tablet by mouth at bedtime as needed (sleep).      escitalopram  (LEXAPRO ) 20 MG tablet TAKE 1 TABLET BY MOUTH DAILY 90 tablet 3   flecainide  (TAMBOCOR ) 50 MG tablet TAKE 1 TABLET BY MOUTH TWICE  DAILY 180 tablet 3   fluticasone (FLONASE) 50 MCG/ACT nasal spray Place 1 spray into both nostrils daily.     metoprolol  succinate (TOPROL -XL) 25 MG 24 hr tablet Take 0.5 tablets (12.5 mg total) by mouth daily. 45 tablet 3   Omega-3 Fatty Acids (FISH OIL PO) Take by mouth.     pantoprazole  (PROTONIX ) 40 MG tablet TAKE 1 TABLET BY MOUTH DAILY 90 tablet 3   TART CHERRY PO Take 500 mg by mouth daily.     Turmeric (QC TUMERIC COMPLEX PO)  Take 1 tablet by mouth 2 (two) times daily. Tumeric ginger complex - 6000 mcg daily     No current facility-administered medications for this encounter.    Atrial Fibrillation Management history:  Previous antiarrhythmic drugs: flecainide  Previous cardioversions: none Previous ablations: none Anticoagulation history: none   ROS- All systems are reviewed and negative except as per the HPI above.  Physical Exam: BP 114/88   Pulse 65   Ht 5' 7 (1.702 m)   Wt 73.5 kg   LMP 07/03/2018   BMI 25.37 kg/m    GEN- The patient is well appearing, alert and oriented x 3 today.   Neck - no JVD or carotid bruit noted Lungs- Clear to ausculation bilaterally, normal work of breathing Heart- Regular rate and rhythm, no murmurs, rubs or gallops, PMI not laterally displaced Extremities- no clubbing, cyanosis, or edema Skin - no rash or ecchymosis noted   EKG today demonstrates  Vent. rate 65 BPM PR interval 172 ms QRS duration 82 ms QT/QTcB 398/413 ms P-R-T axes 64 43 -35 Normal sinus rhythm Nonspecific T wave abnormality When compared with ECG of 25-Nov-2023 15:44, No significant change was found  Echo 03/22/23 demonstrated  1. Left ventricular ejection fraction, by estimation, is 65 to 70%. The  left ventricle has normal function. The left ventricle has no regional  wall motion abnormalities. Left ventricular diastolic parameters were  normal. The average left ventricular  global longitudinal strain is -21.5 %. The global longitudinal strain is  normal.   2. Right ventricular systolic function is normal. The right ventricular  size is normal.   3. The mitral valve is normal in structure. Trivial mitral valve  regurgitation. No evidence of mitral stenosis. There is mild prolapse of  anterior leaflet of the mitral valve.   4. The aortic valve is tricuspid. Aortic valve regurgitation is not  visualized. No aortic stenosis is present.   5. The inferior vena cava is normal in size with greater than 50%  respiratory variability, suggesting right atrial pressure of 3 mmHg.   Cardiac monitor 10-07/2022: Patch Wear Time:  13 days and 23 hours (2023-10-24T09:19:15-0400 to 2023-11-07T07:45:52-0500)   Patient had a min HR of 46 bpm, max HR of 164 bpm, and avg HR of 68 bpm. Predominant underlying rhythm was Sinus Rhythm. First Degree AV Block was present.  1 run of Supraventricular Tachycardia occurred lasting 9 beats with a max rate of 164 bpm (avg 125 bpm). Atrial Fibrillation occurred (1% burden),  ranging from 59-160 bpm (avg of 96 bpm), the longest lasting 3 hours 17 mins with an avg rate of 87 bpm. Supraventricular Tachycardia and Atrial Fibrillation were detected within +/- 45 seconds of symptomatic patient event(s).  Rare PACs and rare PVCs.   ASSESSMENT & PLAN CHA2DS2-VASc Score = 1  The patient's score is based upon: CHF History: 0 HTN History: 0 Diabetes History: 0 Stroke History: 0 Vascular Disease History: 0 Age Score: 0 Gender Score: 1       ASSESSMENT AND PLAN: Paroxysmal Atrial Fibrillation (ICD10:  I48.0) The patient's CHA2DS2-VASc score is 1, indicating a 0.6% annual risk of stroke.    Patient is currently in NSR. Continue Toprol  12.5 mg daily. We did briefly discuss other rhythm control options such as Tikosyn or ablation. Patient would like to continue with flecainide  for rhythm control for now.   High risk medication monitoring (ICD10: [id number]) Patient requires ongoing monitoring for anti-arrhythmic medication which has the potential to cause life threatening arrhythmias or AV block.  ECG intervals are stable. Continue flecainide  50 mg BID.    Follow up in 6 months for flecainide  surveillance.   Terra Pac, PA-C  [facility] [address] [place], [place] [address] [phone]

## 2024-06-07 ENCOUNTER — Ambulatory Visit: Attending: Cardiology | Admitting: Cardiology

## 2024-06-07 ENCOUNTER — Encounter: Payer: Self-pay | Admitting: Cardiology

## 2024-06-07 VITALS — BP 124/83 | HR 69 | Resp 16 | Ht 67.0 in | Wt 164.2 lb

## 2024-06-07 DIAGNOSIS — R002 Palpitations: Secondary | ICD-10-CM

## 2024-06-07 DIAGNOSIS — I480 Paroxysmal atrial fibrillation: Secondary | ICD-10-CM | POA: Diagnosis not present

## 2024-06-07 DIAGNOSIS — E7800 Pure hypercholesterolemia, unspecified: Secondary | ICD-10-CM | POA: Diagnosis not present

## 2024-06-07 DIAGNOSIS — R072 Precordial pain: Secondary | ICD-10-CM

## 2024-06-07 NOTE — Progress Notes (Signed)
 Cardiology Office Note:  .   Date:  06/07/2024  ID:  Jill Leonard, DOB 21-Jun-1971, MRN [id number] PCP:  Jill Greig BRAVO, MD  Former Cardiology Providers: Dr. Darron [facility] Providers Cardiologist:  Deatrice Darron, MD , [facility] (established care 06/07/24) Electrophysiologist:  None  Click to update primary MD,subspecialty MD or APP then REFRESH:1}    Chief Complaint  Patient presents with   Atrial Fibrillation   Follow-up    Chest heaviness    History of Present Illness: .   Jill Leonard is a 53 y.o. Caucasian female whose past medical history and cardiovascular risk factors includes: Sjogren's syndrome, HLD, borderline MVP, and paroxysmal atrial fibrillation   Formally under the care of Dr. Darron who last saw Jill Leonard back in 02/2023. I am seeing her for the first time to re-establishing care.   Patient was evaluated for palpitations and tachycardia.  Workup illustrated paroxysmal episodes of atrial fibrillation and based on prior cardiac monitors overall burden was approximately 1%.  Patient was referred to A-fib clinic and was started on flecainide  and is being monitored there.  Patient is here to reestablish care with cardiology and I am seeing her for the first time.  Atrial fibrillation: Has had paroxysmal episodes. Last cardiac monitor notes A-fib burden closer to 1%. Started on flecainide  in December 2024. Flecainide  monitoring has currently been performed by A-fib clinic Patient has considered atrial fibrillation ablation in the past but wanted to hold off.  Chest heaviness: Ongoing for several years. Brought on by exertional activities such as heavy lifting, bending down to clean bathrooms, etc. The discomfort usually improves with resting and relaxing Prior workup has included GXT as part of flecainide  monitoring. No active chest pain  Review of Systems: .   Review of Systems  Cardiovascular:  Positive for chest pain and palpitations. Negative  for claudication, irregular heartbeat, leg swelling, near-syncope, orthopnea, paroxysmal nocturnal dyspnea and syncope.  Respiratory:  Negative for shortness of breath.   Hematologic/Lymphatic: Negative for bleeding problem.    Studies Reviewed:   EKG: June 01, 2024: Normal sinus rhythm, 65 bpm, nonspecific ST-T changes, QRS duration 82 ms, QT interval corrected 413 ms  Echocardiogram: 03/22/2023 1. Left ventricular ejection fraction, by estimation, is 65 to 70%. The  left ventricle has normal function. The left ventricle has no regional  wall motion abnormalities. Left ventricular diastolic parameters were  normal. The average left ventricular  global longitudinal strain is -21.5 %. The global longitudinal strain is  normal.   2. Right ventricular systolic function is normal. The right ventricular  size is normal.   3. The mitral valve is normal in structure. Trivial mitral valve  regurgitation. No evidence of mitral stenosis. There is mild prolapse of  anterior leaflet of the mitral valve.   4. The aortic valve is tricuspid. Aortic valve regurgitation is not  visualized. No aortic stenosis is present.   5. The inferior vena cava is normal in size with greater than 50%  respiratory variability, suggesting right atrial pressure of 3 mmHg.   Cardiac monitor 10-07/2022: Patch Wear Time:  13 days and 23 hours (2023-10-24T09:19:15-0400 to 2023-11-07T07:45:52-0500)   Patient had a min HR of 46 bpm, max HR of 164 bpm, and avg HR of 68 bpm. Predominant underlying rhythm was Sinus Rhythm. First Degree AV Block was present.  1 run of Supraventricular Tachycardia occurred lasting 9 beats with a max rate of 164 bpm (avg 125 bpm). Atrial Fibrillation occurred (1% burden),  ranging from 59-160 bpm (avg of 96 bpm), the longest lasting 3 hours 17 mins with an avg rate of 87 bpm. Supraventricular Tachycardia and Atrial Fibrillation were detected within +/- 45 seconds of symptomatic patient  event(s).  Rare PACs and rare PVCs.  RADIOLOGY: NA   Risk Assessment/Calculations:   Click Here to Calculate/Change CHADS2VASc Score The patient's CHADS2-VASc score is  , indicating a  % annual risk of stroke.     The 10-year ASCVD risk score (Arnett DK, et al., 2019) is: 1.7%   Values used to calculate the score:     Age: 53 years     Clincally relevant sex: Female     Is Non-Hispanic African American: No     Diabetic: No     Tobacco smoker: No     Systolic Blood Pressure: 124 mmHg     Is BP treated: No     HDL Cholesterol: 57.5 mg/dL     Total Cholesterol: 249 mg/dL  Labs:       Latest Ref Rng & Units 02/25/2024   10:28 AM 08/26/2023   10:03 AM 02/02/2023    9:50 AM  CBC  WBC 3.8 - 10.8 Thousand/uL 3.7  4.2  4.8   Hemoglobin 11.7 - 15.5 g/dL 85.4  85.6  85.5   Hematocrit 35.0 - 45.0 % 44.5  44.6  43.4   Platelets 140 - 400 Thousand/uL 270  288  258        Latest Ref Rng & Units 01/26/2024    9:28 AM 08/26/2023   10:03 AM 02/02/2023    9:50 AM  BMP  Glucose 70 - 99 mg/dL 81  71  77   BUN 6 - 23 mg/dL 15  13  13    Creatinine 0.40 - 1.20 mg/dL 9.15  9.21  9.19   BUN/Creat Ratio 6 - 22 (calc)  SEE NOTE:  SEE NOTE:   Sodium 135 - 145 mEq/L 140  140  140   Potassium 3.5 - 5.1 mEq/L 4.1  3.8  3.8   Chloride 96 - 112 mEq/L 104  103  104   CO2 19 - 32 mEq/L 29  31  28    Calcium  8.4 - 10.5 mg/dL 9.3  9.8  9.4       Latest Ref Rng & Units 02/25/2024   10:28 AM 01/26/2024    9:28 AM 08/26/2023   10:03 AM  CMP  Glucose 70 - 99 mg/dL  81  71   BUN 6 - 23 mg/dL  15  13   Creatinine 9.59 - 1.20 mg/dL  9.15  9.21   Sodium 864 - 145 mEq/L  140  140   Potassium 3.5 - 5.1 mEq/L  4.1  3.8   Chloride 96 - 112 mEq/L  104  103   CO2 19 - 32 mEq/L  29  31   Calcium  8.4 - 10.5 mg/dL  9.3  9.8   Total Protein 6.1 - 8.1 g/dL 8.3  7.9  8.1   Total Bilirubin 0.2 - 1.2 mg/dL  0.5  0.4   Alkaline Phos 39 - 117 U/L  59    AST 0 - 37 U/L  25  28   ALT 0 - 35 U/L  19  23     Lab  Results  Component Value Date   CHOL 249 (H) 01/26/2024   HDL 57.50 01/26/2024   LDLCALC 174 (H) 01/26/2024   TRIG 87.0 01/26/2024   CHOLHDL 4 01/26/2024  No results for input(s): LIPOA in the last 8760 hours. No components found for: NTPROBNP No results for input(s): PROBNP in the last 8760 hours. Recent Labs    01/26/24 0928  TSH 1.86    Physical Exam:    Today's Vitals   06/07/24 0910  BP: 124/83  Pulse: 69  Resp: 16  SpO2: 97%  Weight: 164 lb 3.2 oz (74.5 kg)  Height: 5' 7 (1.702 m)   Body mass index is 25.72 kg/m. Wt Readings from Last 3 Encounters:  06/07/24 164 lb 3.2 oz (74.5 kg)  06/01/24 162 lb (73.5 kg)  04/27/24 161 lb (73 kg)    Physical Exam  Constitutional: No distress.  hemodynamically stable  Neck: No JVD present.  Cardiovascular: Normal rate, regular rhythm, S1 normal and S2 normal. Exam reveals no gallop, no S3 and no S4.  No murmur heard. Pulmonary/Chest: Effort normal and breath sounds normal. No stridor. She has no wheezes. She has no rales.  Musculoskeletal:        General: No edema.     Cervical back: Neck supple.  Skin: Skin is warm.     Impression & Recommendation(s):  Impression:   ICD-10-CM   1. Paroxysmal atrial fibrillation (HCC)  I48.0 Basic metabolic panel with GFR    2. Palpitations  R00.2 Basic metabolic panel with GFR    3. Precordial pain  R07.2 CT CORONARY MORPH W/CTA COR W/SCORE W/CA W/CM &/OR WO/CM    Basic metabolic panel with GFR    4. Pure hypercholesterolemia  E78.00 Basic metabolic panel with GFR       Recommendation(s):  Paroxysmal atrial fibrillation (HCC) Palpitations Rate control: Metoprolol  succinate 12.5 mg p.o. daily. Rhythm control: Flecainide  50 mg p.o. twice daily. Thromboembolic prophylaxis: Aspirin [id number] SCORE is 1. Does not take Excedrin Migraine on a daily basis and therefore we will discontinue the medication and start aspirin 81 mg p.o. daily.  However when she does take  Excedrin Migraine patient is asked not to take aspirin for that date. She will continue to follow-up with A-fib clinic for flecainide  monitoring  Precordial pain Symptoms suggestive of cardiac discomfort. Ongoing for the last couple years Echo and EKG results reviewed independently as part of today's visit. Given her symptoms, age, and uncontrolled hyperlipidemia will recommend coronary CTA to evaluate for obstructive disease Avoid overexertion Further recommendations to follow Coronary CTA to evaluate for CAC, plaque burden, and obstructive disease Check BMP Will continue the current dose of Toprol -XL, increasing it causes hypotension with SBP's of 90 mmHg.  Pure hypercholesterolemia Long history of hyperlipidemia. Most recent lipid profile from May 2025 notes LDL at 174 mg/dL. ASCVD risk score 1.7% We discussed lipid-lowering agents such as statins, nonstatin's, and PCSK9 inhibitors. Shared decision was to await the results of the coronary CTA prior to considering therapy.   Orders Placed:  Orders Placed This Encounter  Procedures   CT CORONARY MORPH W/CTA COR W/SCORE W/CA W/CM &/OR WO/CM    Standing Status:   Future    Expected Date:   07/17/2024    Expiration Date:   06/07/2025    Scheduling Instructions:     Try to make appointment closer to follow up appointment in 8 weeks per Dr. Michele.    If indicated for the ordered procedure, I authorize the administration of contrast media per Radiology protocol:   Yes    [facility]   Basic metabolic panel with GFR     Final  Medication List:   No orders of the defined types were placed in this encounter.   Medications Discontinued During This Encounter  Medication Reason   aspirin-acetaminophen -caffeine  (EXCEDRIN MIGRAINE) 250-250-65 MG tablet Discontinued by provider     Current Outpatient Medications:    acetaminophen  (TYLENOL ) 500 MG tablet, Take 1,000 mg by mouth as needed for  moderate pain (pain score 4-6) or headache., Disp: , Rfl:    BIOTIN PO, Take 1 tablet by mouth in the morning. Biotin 10,000 mcg + Calcium , Disp: , Rfl:    calcium  carbonate (TUMS - DOSED IN MG ELEMENTAL CALCIUM ) 500 MG chewable tablet, Chew 500 mg by mouth as needed for indigestion or heartburn., Disp: , Rfl:    Carboxymethylcellul-Glycerin (LUBRICATING EYE DROPS OP), Place 1 drop into both eyes daily as needed (dry eyes)., Disp: , Rfl:    cholecalciferol (VITAMIN D ) 1000 UNITS tablet, Take 1,000 Units by mouth every evening., Disp: , Rfl:    diphenhydramine-acetaminophen  (TYLENOL  PM) 25-500 MG TABS tablet, Take 1 tablet by mouth at bedtime as needed (sleep). , Disp: , Rfl:    escitalopram  (LEXAPRO ) 20 MG tablet, TAKE 1 TABLET BY MOUTH DAILY, Disp: 90 tablet, Rfl: 3   flecainide  (TAMBOCOR ) 50 MG tablet, TAKE 1 TABLET BY MOUTH TWICE  DAILY, Disp: 180 tablet, Rfl: 3   fluticasone (FLONASE) 50 MCG/ACT nasal spray, Place 1 spray into both nostrils daily., Disp: , Rfl:    metoprolol  succinate (TOPROL -XL) 25 MG 24 hr tablet, Take 0.5 tablets (12.5 mg total) by mouth daily., Disp: 45 tablet, Rfl: 3   Omega-3 Fatty Acids (FISH OIL PO), Take by mouth., Disp: , Rfl:    pantoprazole  (PROTONIX ) 40 MG tablet, TAKE 1 TABLET BY MOUTH DAILY, Disp: 90 tablet, Rfl: 3   TART CHERRY PO, Take 500 mg by mouth daily., Disp: , Rfl:    Turmeric (QC TUMERIC COMPLEX PO), Take 1 tablet by mouth 2 (two) times daily. Tumeric ginger complex - 6000 mcg daily, Disp: , Rfl:   Consent:   NA  Disposition:   8-week follow-up sooner if needed  Her questions and concerns were addressed to her satisfaction. She voices understanding of the recommendations provided during this encounter.    Signed, Madonna Michele HAS, [facility]  A Division of [facility] [address] [place], [place] [address]  06/07/2024 10:50 AM

## 2024-06-07 NOTE — Patient Instructions (Addendum)
 Medication Instructions:  Start Asprin 81 mg daily  *If you need a refill on your cardiac medications before your next appointment, please call your pharmacy*  Lab Work: BMET today  Testing/Procedures:   Your cardiac CT will be scheduled at one of the below locations:   [facility] [address]  [place], [place] [address] [phone]  If scheduled at the [facility] at [facility] [address], please enter the parking lot using the [address] entrance and use the FREE valet service at the patient drop-off area. Enter the building and check-in with registration on the main floor.  Please follow these instructions carefully (unless otherwise directed):  An IV will be required for this test and Nitroglycerin will be given.  Hold all erectile dysfunction medications at least 3 days (72 hrs) prior to test. (Ie viagra, cialis, sildenafil, tadalafil, etc)   On the Night Before the Test: Be sure to Drink plenty of water . Do not consume any caffeinated/decaffeinated beverages or chocolate 12 hours prior to your test. Do not take any antihistamines 12 hours prior to your test.  On the Day of the Test: Drink plenty of water  until 1 hour prior to the test. Do not eat any food 1 hour prior to test. You may take your regular medications prior to the test.  Take metoprolol  (Lopressor ) two hours prior to test. If you take Furosemide/Hydrochlorothiazide/Spironolactone/Chlorthalidone, please HOLD on the morning of the test. Patients who wear a continuous glucose monitor MUST remove the device prior to scanning. FEMALES- please wear underwire-free bra if available, avoid dresses & tight clothing  After the Test: Drink plenty of water . After receiving IV contrast, you may experience a mild flushed feeling. This is normal. On occasion, you may experience a mild rash up to 24 hours after the test. This is not dangerous. If this occurs, you can take  Benadryl 25 mg, Zyrtec, Claritin, or Allegra and increase your fluid intake. (Patients taking Tikosyn should avoid Benadryl, and may take Zyrtec, Claritin, or Allegra) If you experience trouble breathing, this can be serious. If it is severe call 911 IMMEDIATELY. If it is mild, please call our office.  We will call to schedule your test 2-4 weeks out understanding that some insurance companies will need an authorization prior to the service being performed.   For more information and frequently asked questions, please visit our website : [email or web address]  For non-scheduling related questions, please contact the cardiac imaging nurse navigator should you have any questions/concerns: Cardiac Imaging Nurse Navigators Direct Office Dial: [phone]   For scheduling needs, including cancellations and rescheduling, please call [place], [phone].   Follow-Up: At [facility], you and your health needs are our priority.  As part of our continuing mission to provide you with exceptional heart care, our providers are all part of one team.  This team includes your primary Cardiologist (physician) and Advanced Practice Providers or APPs (Physician Assistants and Nurse Practitioners) who all work together to provide you with the care you need, when you need it.  Your next appointment:   8 week(s)  Provider:   Dr. Michele    We recommend signing up for the patient portal called MyChart.  Sign up information is provided on this After Visit Summary.  MyChart is used to connect with patients for Virtual Visits (Telemedicine).  Patients are able to view lab/test results, encounter notes, upcoming appointments, etc.  Non-urgent messages can be sent to your provider as well.  To learn more about what you can do with MyChart, go to [email or web address].

## 2024-06-27 ENCOUNTER — Other Ambulatory Visit (HOSPITAL_COMMUNITY): Payer: Self-pay | Admitting: *Deleted

## 2024-06-27 DIAGNOSIS — I480 Paroxysmal atrial fibrillation: Secondary | ICD-10-CM

## 2024-07-06 DIAGNOSIS — R002 Palpitations: Secondary | ICD-10-CM | POA: Diagnosis not present

## 2024-07-06 DIAGNOSIS — R072 Precordial pain: Secondary | ICD-10-CM | POA: Diagnosis not present

## 2024-07-06 DIAGNOSIS — Z1330 Encounter for screening examination for mental health and behavioral disorders, unspecified: Secondary | ICD-10-CM | POA: Diagnosis not present

## 2024-07-06 DIAGNOSIS — R102 Pelvic and perineal pain unspecified side: Secondary | ICD-10-CM | POA: Diagnosis not present

## 2024-07-06 DIAGNOSIS — I480 Paroxysmal atrial fibrillation: Secondary | ICD-10-CM | POA: Diagnosis not present

## 2024-07-06 DIAGNOSIS — E7800 Pure hypercholesterolemia, unspecified: Secondary | ICD-10-CM | POA: Diagnosis not present

## 2024-07-06 DIAGNOSIS — Z01419 Encounter for gynecological examination (general) (routine) without abnormal findings: Secondary | ICD-10-CM | POA: Diagnosis not present

## 2024-07-07 LAB — BASIC METABOLIC PANEL WITH GFR
BUN/Creatinine Ratio: 15 (ref 9–23)
BUN: 12 mg/dL (ref 6–24)
CO2: 25 mmol/L (ref 20–29)
Calcium: 9.3 mg/dL (ref 8.7–10.2)
Chloride: 103 mmol/L (ref 96–106)
Creatinine, Ser: 0.82 mg/dL (ref 0.57–1.00)
Glucose: 73 mg/dL (ref 70–99)
Potassium: 4.5 mmol/L (ref 3.5–5.2)
Sodium: 141 mmol/L (ref 134–144)
eGFR: 85 mL/min/1.73 (ref >59)

## 2024-07-08 ENCOUNTER — Ambulatory Visit: Payer: Self-pay | Admitting: Cardiology

## 2024-07-13 ENCOUNTER — Encounter (HOSPITAL_COMMUNITY): Payer: Self-pay

## 2024-07-17 ENCOUNTER — Ambulatory Visit (HOSPITAL_COMMUNITY): Admission: RE | Admit: 2024-07-17 | Source: Ambulatory Visit

## 2024-07-21 ENCOUNTER — Ambulatory Visit: Admitting: Family

## 2024-07-21 ENCOUNTER — Encounter: Payer: Self-pay | Admitting: Family

## 2024-07-21 ENCOUNTER — Ambulatory Visit: Payer: Self-pay

## 2024-07-21 VITALS — BP 113/76 | HR 71 | Temp 97.3°F | Ht 67.0 in | Wt 164.1 lb

## 2024-07-21 DIAGNOSIS — R053 Chronic cough: Secondary | ICD-10-CM

## 2024-07-21 DIAGNOSIS — J069 Acute upper respiratory infection, unspecified: Secondary | ICD-10-CM | POA: Diagnosis not present

## 2024-07-21 LAB — POCT INFLUENZA A/B
Influenza A, POC: NEGATIVE
Influenza B, POC: NEGATIVE

## 2024-07-21 MED ORDER — GUAIFENESIN-CODEINE 100-10 MG/5ML PO SOLN: 5.0000 mL | Freq: Three times a day (TID) | ORAL | 0 refills | Status: DC | PRN

## 2024-07-21 MED ORDER — AZITHROMYCIN 250 MG PO TABS: ORAL_TABLET | ORAL | 0 refills | Status: AC

## 2024-07-21 NOTE — Telephone Encounter (Signed)
 I spoke with pt and starting 07/15/24 pt has had non prod cough and daily fever. 07/21/24 fever 100. No CP or SOB. Neg covid test on 07/21/24. Pt would like to be seen today. No available appts at [facility] today. Pt scheduled appt at [facility] 07/21/24 at 3:30 with Corean Comment NP with UC & ED precautions and pt voiced understanding and appreciative for appt. Pt request to cancel 07/22/24 appt at [facility]. Sending note to Corean Comment NP and  FYI to Dr Greig Ring as PCP.

## 2024-07-21 NOTE — Telephone Encounter (Signed)
 FYI Only or Action Required?: FYI only for provider: UC advised.  Patient was last seen in primary care on 04/27/2024 by Jill Greig BRAVO, MD.  Called Nurse Triage reporting Cough.  Symptoms began a week ago.  Interventions attempted: OTC medications: Tylenol  and delsym.  Symptoms are: unchanged.  Triage Disposition: See Physician Within 24 Hours  Patient/caregiver understands and will follow disposition?: Yes  Copied from CRM [id number]. Topic: Clinical - Red Word Triage >> Jul 21, 2024 11:03 AM Jill Leonard wrote: Red Word that prompted transfer to Nurse Triage: sick for a week, cough, fever and sore throat Reason for Disposition  Fever present > 3 days (72 hours)  Answer Assessment - Initial Assessment Questions Onset of cough, sore throat and fever for past week. Sore throat has resolved but cough and fever have persisted. Taking delysm and tylenol  with some relief. Home covid test negative. Thinks she got sick from a family member. No appts at any offices in pt region within next 24 hrs, appt [facility] at [facility] tomorrow morning. Advised UC sooner or ED for worsening symptoms.  1. ONSET: When did the cough begin?      Saturday  2. SEVERITY: How bad is the cough today?      Comes and goes worse at night  3. SPUTUM: Describe the color of your sputum (e.g., none, dry cough; clear, white, yellow, green)     Gunky yellow color  4. HEMOPTYSIS: Are you coughing up any blood? If Yes, ask: How much? (e.g., flecks, streaks, tablespoons, etc.)     Denies  5. DIFFICULTY BREATHING: Are you having difficulty breathing? If Yes, ask: How bad is it? (e.g., mild, moderate, severe)      Denies  6. FEVER: Do you have a fever? If Yes, ask: What is your temperature, how was it measured, and when did it start?     100 F this morning, has had fever all week, highest temp was 100.9 F  7. CARDIAC HISTORY: Do you have any history of heart disease? (e.g., heart attack, congestive heart failure)       A fib  8. LUNG HISTORY: Do you have any history of lung disease?  (e.g., pulmonary embolus, asthma, emphysema)     Denies  9. PE RISK FACTORS: Do you have a history of blood clots? (or: recent major surgery, recent prolonged travel, bedridden)     Denies  10. OTHER SYMPTOMS: Do you have any other symptoms? (e.g., runny nose, wheezing, chest pain)       Runny nose  Protocols used: Cough - Acute Productive-A-AH

## 2024-07-21 NOTE — Progress Notes (Signed)
 Patient ID: Jill Leonard, female    DOB: 25-Jan-1971, 53 y.o.   MRN: [id number]  Chief Complaint  Patient presents with   Cough    Pt c/o Cough, sore throat, present since last Saturday. Also c/ofever of 100 this morning. Has tried tylenol  and cough syrup OTC. Negative for covid today.   Discussed the use of AI scribe software for clinical note transcription with the patient, who gave verbal consent to proceed.  History of Present Illness Jill Leonard is a 53 year old female with autoimmune diseases who presents with persistent fever and respiratory symptoms.  She has experienced a persistent fever of 100F for about a week, with only a brief period of near-normal temperature. Her at-home COVID test this morning was negative, and she has not been tested for the flu until today. She frequently falls ill during winter due to her compromised immune system and does not receive flu shots, having had the flu multiple times.  Her respiratory symptoms include a persistent, non-productive cough that worsens at night, causing sleep disturbances. Denies SOB or chest tightness. She experiences a tickle in her throat when lying down. She uses Flonase and saline spray for seasonal allergies and albuterol  as needed for respiratory symptoms. She has a history of COVID-19, contracting it four or five times, with the first instance being the most severe. Her resting heart rate increases when unwell, monitored via Fitbit. She takes a beta blocker and medication for atrial fibrillation, which helps manage her heart rate during fevers. She has tried over-the-counter cold medicine and uses a humidifier at night to alleviate dryness. She is hesitant to use prednisone due to severe bone pain experienced previously.  Assessment & Plan Acute upper respiratory infection with cough and low-grade fever Symptoms persisted for a week with negative flu test. Considered viral infection, possibly COVID-19, and potential  bacterial infection due to symptom duration. Declined prednisone due to past adverse effects. - Prescribed azithromycin  (Z-Pak) for 5 days. - Ok to use rescue, Albuterol  inhaler, 1-2 puffs as needed if coughing persistently, experiencing SOB or chest tightness. - Sending Cheratussin cough syrup with codeine for nighttime use. - Advised continued use of humidifier and saline nasal spray. - Continue saline nasal spray and Flonase.  Seasonal allergic rhinitis Chronic condition managed with saline nasal spray and Flonase. Symptoms include nasal congestion and post-nasal drip, worsened during illness. - Continue daily saline nasal spray and Flonase to help current symptoms.  Subjective:    Outpatient Medications Prior to Visit  Medication Sig Dispense Refill   acetaminophen  (TYLENOL ) 500 MG tablet Take 1,000 mg by mouth as needed for moderate pain (pain score 4-6) or headache.     BIOTIN PO Take 1 tablet by mouth in the morning. Biotin 10,000 mcg + Calcium      calcium  carbonate (TUMS - DOSED IN MG ELEMENTAL CALCIUM ) 500 MG chewable tablet Chew 500 mg by mouth as needed for indigestion or heartburn.     Carboxymethylcellul-Glycerin (LUBRICATING EYE DROPS OP) Place 1 drop into both eyes daily as needed (dry eyes).     cholecalciferol (VITAMIN D ) 1000 UNITS tablet Take 1,000 Units by mouth every evening.     diphenhydramine-acetaminophen  (TYLENOL  PM) 25-500 MG TABS tablet Take 1 tablet by mouth at bedtime as needed (sleep).      escitalopram  (LEXAPRO ) 20 MG tablet TAKE 1 TABLET BY MOUTH DAILY 90 tablet 3   flecainide  (TAMBOCOR ) 50 MG tablet TAKE 1 TABLET BY MOUTH TWICE  DAILY 180 tablet  3   fluticasone (FLONASE) 50 MCG/ACT nasal spray Place 1 spray into both nostrils daily.     metoprolol  succinate (TOPROL -XL) 25 MG 24 hr tablet Take 0.5 tablets (12.5 mg total) by mouth daily. 45 tablet 3   Omega-3 Fatty Acids (FISH OIL PO) Take by mouth.     pantoprazole  (PROTONIX ) 40 MG tablet TAKE 1 TABLET BY  MOUTH DAILY 90 tablet 3   TART CHERRY PO Take 500 mg by mouth daily.     Turmeric (QC TUMERIC COMPLEX PO) Take 1 tablet by mouth 2 (two) times daily. Tumeric ginger complex - 6000 mcg daily     No facility-administered medications prior to visit.   Past Medical History:  Diagnosis Date   Anal fissure    Arthralgia of temporomandibular joint    MIGRAINES AND REGULAR   Atrial fibrillation (HCC)    per patient   Calculus of gallbladder without mention of cholecystitis or obstruction    Complication of anesthesia    Depression    Dysrhythmia    RAPID HEARTBEAT AND SKIPPED BEATS ON METOPROLOL    Family history of adverse reaction to anesthesia    FAMILY MEMBERS HAVE PONV   Fibromyalgia    GERD (gastroesophageal reflux disease)    Heart murmur    IN PAST, TOLD IS GONE NOW   History of kidney stones    History of nephrolithiasis    HLD (hyperlipidemia)    PONV (postoperative nausea and vomiting)    Raynaud's disease    per patient   Sjogren - Larsson's syndrome    Sjogren's syndrome    Past Surgical History:  Procedure Laterality Date   ABDOMINAL HYSTERECTOMY     CHOLECYSTECTOMY  08/20/08   MANDIBLE SURGERY     VENTRAL HERNIA REPAIR N/A 06/12/2019   Procedure: VENTRAL HERNIA REPAIR WITH MESH;  Surgeon: Curvin Mt III, MD;  Location: [facility] OR;  Service: General;  Laterality: N/A;   WISDOM TOOTH EXTRACTION     Allergies  Allergen Reactions   Hydrocodone -Guaifenesin Nausea And Vomiting   Sulfonamide Derivatives Nausea And Vomiting     I could not function   Trazodone  And Nefazodone Other (See Comments)    Jittery and anxious and difficulty sleeping.   Latex Rash and Other (See Comments)    Blisters      Objective:    Physical Exam Vitals and nursing note reviewed.  Constitutional:      Appearance: Normal appearance. She is not ill-appearing.     Interventions: Face mask in place.  HENT:     Right Ear: Tympanic membrane and ear canal normal.     Left Ear: Tympanic  membrane and ear canal normal.     Nose:     Right Sinus: No frontal sinus tenderness.     Left Sinus: No frontal sinus tenderness.     Mouth/Throat:     Mouth: Mucous membranes are moist.     Pharynx: Posterior oropharyngeal erythema present. No pharyngeal swelling, oropharyngeal exudate or uvula swelling.     Tonsils: No tonsillar exudate or tonsillar abscesses.  Cardiovascular:     Rate and Rhythm: Normal rate and regular rhythm.  Pulmonary:     Effort: Pulmonary effort is normal.     Breath sounds: Examination of the right-upper field reveals rhonchi. Examination of the left-upper field reveals rhonchi. Examination of the right-middle field reveals rhonchi. Examination of the left-middle field reveals rhonchi. Rhonchi (mild) present.  Musculoskeletal:        General: Normal range  of motion.  Lymphadenopathy:     Head:     Right side of head: No preauricular or posterior auricular adenopathy.     Left side of head: No preauricular or posterior auricular adenopathy.     Cervical: No cervical adenopathy.  Skin:    General: Skin is warm and dry.  Neurological:     Mental Status: She is alert.  Psychiatric:        Mood and Affect: Mood normal.        Behavior: Behavior normal.    BP 113/76 (BP Location: Left Arm, Patient Position: Sitting, Cuff Size: Large)   Pulse 71   Temp (!) 97.3 F (36.3 C) (Temporal)   Ht 5' 7 (1.702 m)   Wt 164 lb 2 oz (74.4 kg)   LMP 07/03/2018   SpO2 97%   BMI 25.71 kg/m  Wt Readings from Last 3 Encounters:  07/21/24 164 lb 2 oz (74.4 kg)  06/07/24 164 lb 3.2 oz (74.5 kg)  06/01/24 162 lb (73.5 kg)       Lucius Krabbe, NP

## 2024-07-21 NOTE — Telephone Encounter (Signed)
 Noted, thank you for seeing her Corean!

## 2024-07-22 ENCOUNTER — Ambulatory Visit (HOSPITAL_COMMUNITY): Payer: Self-pay

## 2024-07-28 ENCOUNTER — Ambulatory Visit (HOSPITAL_COMMUNITY)
Admission: RE | Admit: 2024-07-28 | Discharge: 2024-07-28 | Disposition: A | Discharge status: Home / Self Care | Source: Ambulatory Visit | Attending: Cardiology | Admitting: Cardiology

## 2024-07-28 DIAGNOSIS — R072 Precordial pain: Secondary | ICD-10-CM | POA: Diagnosis not present

## 2024-07-28 DIAGNOSIS — R079 Chest pain, unspecified: Secondary | ICD-10-CM | POA: Diagnosis not present

## 2024-07-28 MED ORDER — NITROGLYCERIN 0.4 MG SL SUBL
0.8000 mg | SUBLINGUAL_TABLET | Freq: Once | SUBLINGUAL | Status: AC
  Administered 2024-07-28: 0.8 mg via SUBLINGUAL

## 2024-07-28 MED ORDER — IOHEXOL 350 MG/ML SOLN
95.0000 mL | Freq: Once | INTRAVENOUS | Status: AC | PRN
  Administered 2024-07-28: 95 mL via INTRAVENOUS

## 2024-07-28 MED ORDER — DILTIAZEM HCL 25 MG/5ML IV SOLN: 10.0000 mg | INTRAVENOUS | Status: DC | PRN

## 2024-07-28 MED ORDER — METOPROLOL TARTRATE 5 MG/5ML IV SOLN
10.0000 mg | Freq: Once | INTRAVENOUS | Status: AC | PRN
  Administered 2024-07-28: 10 mg via INTRAVENOUS

## 2024-08-02 ENCOUNTER — Ambulatory Visit: Admitting: Cardiology

## 2024-08-07 ENCOUNTER — Encounter: Payer: Self-pay | Admitting: Cardiovascular Disease

## 2024-08-07 ENCOUNTER — Ambulatory Visit: Attending: Cardiovascular Disease | Admitting: Cardiovascular Disease

## 2024-08-07 VITALS — BP 110/70 | HR 64 | Ht 67.0 in | Wt 166.0 lb

## 2024-08-07 DIAGNOSIS — R002 Palpitations: Secondary | ICD-10-CM

## 2024-08-07 DIAGNOSIS — Z5181 Encounter for therapeutic drug level monitoring: Secondary | ICD-10-CM

## 2024-08-07 DIAGNOSIS — Z79899 Other long term (current) drug therapy: Secondary | ICD-10-CM

## 2024-08-07 DIAGNOSIS — I480 Paroxysmal atrial fibrillation: Secondary | ICD-10-CM | POA: Diagnosis not present

## 2024-08-07 NOTE — Patient Instructions (Addendum)
 Medication Instructions:  Your physician recommends that you continue on your current medications as directed. Please refer to the Current Medication list given to you today.  *If you need a refill on your cardiac medications before your next appointment, please call your pharmacy*  Lab Work: None ordered.  If you have labs (blood work) drawn today and your tests are completely normal, you will receive your results only by: MyChart Message (if you have MyChart) OR A paper copy in the mail If you have any lab test that is abnormal or we need to change your treatment, we will call you to review the results.  Testing/Procedures: Your physician has recommended that you have an ablation. Catheter ablation is a medical procedure used to treat some cardiac arrhythmias (irregular heartbeats). During catheter ablation, a long, thin, flexible tube is put into a blood vessel in your groin (upper thigh), or neck. This tube is called an ablation catheter. It is then guided to your heart through the blood vessel. Radio frequency waves destroy small areas of heart tissue where abnormal heartbeats may cause an arrhythmia to start. Please see the instruction sheet given to you today.    Follow-Up: At [facility], you and your health needs are our priority.  As part of our continuing mission to provide you with exceptional heart care, our providers are all part of one team.  This team includes your primary Cardiologist (physician) and Advanced Practice Providers or APPs (Physician Assistants and Nurse Practitioners) who all work together to provide you with the care you need, when you need it.  Your next appointment:   Please let us  know if you decide to move forward with Afib Ablation.  Continue follow up with Afib Clinic

## 2024-08-07 NOTE — Progress Notes (Signed)
  Electrophysiology Office Note:    Date:  08/07/2024   ID:  Jill Leonard, DOB 1971-02-18, MRN [id number]  PCP:  Avelina Greig BRAVO, MD   [facility] Providers Cardiologist:  Deatrice Cage, MD     Referring MD: Terra Fairy PARAS, PA-C   History of Present Illness:    Jill Leonard is a 53 y.o. female with a medical history significant for paroxysmal atrial fibrillation, Sjogren syndrome, referred for arrhythmia management.       History of Present Illness  She is referred from atrial fibrillation clinic where she has been managed with flecainide .  She has been doing very well with flecainide .  1 day in May, she reduced her dose to half pill and had an episode of atrial fibrillation.  To her knowledge, she has not had any recurrence of atrial fibrillation as long as she is on flecainide          Today, she reports that she is well and has no acute complaints.  EKGs/Labs/Other Studies Reviewed Today:     Echocardiogram:  TTE July 2024 LVEF 65 to 70%.  No wall motion abnormalities.   Monitors:  14 day monitor November 2023-- my interpretation Sinus rhythm, heart rate 46 to 139 bpm, average 68 beats minute 1% burden of atrial fibrillation, longest episode 3 hours 17 minutes.  Average rate in A-fib 96 bpm. Symptom episodes were associated with atrial ectopy and with atrial fibrillation.  Stress testing:  Exercise tolerance test January 2025 No evidence of ischemia  Advanced imaging:  CT coronary November 2025 Coronary calcium  score 0    EKG:   EKG Interpretation Date/Time:  Monday August 07 2024 10:15:33 EST Ventricular Rate:  64 PR Interval:  182 QRS Duration:  80 QT Interval:  412 QTC Calculation: 425 R Axis:   70  Text Interpretation: Normal sinus rhythm Low voltage QRS When compared with ECG of 01-Jun-2024 15:32, No significant change since last tracing Confirmed by Nancey Scotts [phone]) on 08/07/2024 10:17:31 AM     Physical Exam:     VS:  BP 110/70 (BP Location: Right Arm, Patient Position: Sitting, Cuff Size: Normal)   Pulse 64   Ht 5' 7 (1.702 m)   Wt 166 lb (75.3 kg)   LMP 07/03/2018   SpO2 96%   BMI 26.00 kg/m     Wt Readings from Last 3 Encounters:  08/07/24 166 lb (75.3 kg)  07/21/24 164 lb 2 oz (74.4 kg)  06/07/24 164 lb 3.2 oz (74.5 kg)     GEN: Well nourished, well developed in no acute distress CARDIAC: RRR, no murmurs, rubs, gallops RESPIRATORY:  Normal work of breathing MUSCULOSKELETAL: no edema    ASSESSMENT & PLAN:     Paroxysmal atrial fibrillation Maintaining normal sinus rhythm on flecainide  50 We discussed the relative risk and benefits of ablation versus continuing flecainide .  I explained the ablation procedure including risks of possible need for cardiac surgery, prolonged hospital stay, and a low but nonzero risk of stroke heart attack or death. She we will consider whether or not she would prefer to continue flecainide  or opt to have the ablation.  Secondary hypercoagulable state CHA2DS2-VASc score is 1 Will need to start Eliquis in the periprocedural period if she opts to pursue ablation    Signed, Scotts BRAVO Nancey, MD  08/07/2024 10:33 AM    [facility]

## 2024-08-16 DIAGNOSIS — Z1231 Encounter for screening mammogram for malignant neoplasm of breast: Secondary | ICD-10-CM | POA: Diagnosis not present

## 2024-08-16 LAB — HM MAMMOGRAPHY

## 2024-08-17 ENCOUNTER — Encounter: Payer: Self-pay | Admitting: Family Medicine

## 2024-08-17 ENCOUNTER — Ambulatory Visit: Payer: Self-pay | Admitting: Family Medicine

## 2024-08-18 ENCOUNTER — Encounter: Payer: Self-pay | Admitting: Cardiovascular Disease

## 2024-08-21 DIAGNOSIS — N958 Other specified menopausal and perimenopausal disorders: Secondary | ICD-10-CM | POA: Diagnosis not present

## 2024-08-21 DIAGNOSIS — R102 Pelvic and perineal pain unspecified side: Secondary | ICD-10-CM | POA: Diagnosis not present

## 2024-08-28 ENCOUNTER — Ambulatory Visit: Admitting: Cardiology

## 2024-08-31 ENCOUNTER — Other Ambulatory Visit: Payer: Self-pay

## 2024-08-31 DIAGNOSIS — I480 Paroxysmal atrial fibrillation: Secondary | ICD-10-CM

## 2024-09-13 ENCOUNTER — Other Ambulatory Visit: Payer: Self-pay

## 2024-09-13 ENCOUNTER — Telehealth: Payer: Self-pay

## 2024-09-13 ENCOUNTER — Other Ambulatory Visit (HOSPITAL_COMMUNITY): Payer: Self-pay

## 2024-09-13 MED ORDER — APIXABAN 5 MG PO TABS
5.0000 mg | ORAL_TABLET | Freq: Two times a day (BID) | ORAL | 5 refills | Status: AC
  Filled 2024-09-13: qty 60, 30d supply, fill #0
  Filled 2024-10-03: qty 60, 30d supply, fill #1

## 2024-09-13 NOTE — Progress Notes (Signed)
 Per Dr Nancey pt will need to start Eliquis prior to ablation.  Dr Inocencio reviewed Dr Marko note and confirms pt should take Eliquis 5mg  - 1 tablet by mouth twice daily.  Rx sent to [facility] to provide patient with 30 days free and then she may transfer prescription if needed.

## 2024-09-13 NOTE — Telephone Encounter (Signed)
 Spoke with pt and advised she will need to start Eliquis 5mg  - 1 tablet by mouth twice daily on Friday 09/15/2024 for pending ablation.  Pt verbalizes understanding and agrees with current plan.

## 2024-09-13 NOTE — Telephone Encounter (Signed)
-----   Message from Nurse Doreatha BROCKS, RN sent at 08/31/2024 10:40 AM EST ----- Regarding: 10/12/24 afib ablation Precert:  MD: Mealor Type of ablation: A-fib Diagnosis: afib CPT code: A-fib (06343) Ablation scheduled (date/time): 1/29 at 1130am  Procedure:  Added to calendar? Yes Orders entered? Yes Letter complete? No, >30 days before procedure Scheduled with cath lab? Yes Any medications to hold? No Labs ordered (CBC, BMET, PT/INR if on warfarin): Yes Mapping system: Doesn't matter CARTO/OPAL rep notified? No Cardiac CT needed? No Dye allergy? No Pre-meds ordered and instructions given? No, not needed Letter method: MyChart H&P: 11/24 Device: No  Follow-up:  Cassie/Angel, please schedule Routine.  Covering RN - please send this message to [employer], EP scheduler, EP Scheduling pool, EP [employer], and CT scheduler (Brittany Lynch/Stephanie Mogg), if indicated.

## 2024-09-15 LAB — CBC

## 2024-09-16 LAB — BASIC METABOLIC PANEL WITH GFR
BUN/Creatinine Ratio: 11 (ref 9–23)
BUN: 11 mg/dL (ref 6–24)
CO2: 26 mmol/L (ref 20–29)
Calcium: 9.4 mg/dL (ref 8.7–10.2)
Chloride: 102 mmol/L (ref 96–106)
Creatinine, Ser: 1.02 mg/dL — ABNORMAL HIGH (ref 0.57–1.00)
Glucose: 75 mg/dL (ref 70–99)
Potassium: 4.2 mmol/L (ref 3.5–5.2)
Sodium: 142 mmol/L (ref 134–144)
eGFR: 66 mL/min/1.73

## 2024-09-16 LAB — CBC
Hematocrit: 43.1 % (ref 34.0–46.6)
Hemoglobin: 14.1 g/dL (ref 11.1–15.9)
MCH: 28.1 pg (ref 26.6–33.0)
MCHC: 32.7 g/dL (ref 31.5–35.7)
MCV: 86 fL (ref 79–97)
Platelets: 277 x10E3/uL (ref 150–450)
RBC: 5.01 x10E6/uL (ref 3.77–5.28)
RDW: 12.9 % (ref 11.7–15.4)
WBC: 3.7 x10E3/uL (ref 3.4–10.8)

## 2024-09-18 ENCOUNTER — Ambulatory Visit: Payer: Self-pay | Admitting: Cardiovascular Disease

## 2024-09-22 ENCOUNTER — Encounter (HOSPITAL_COMMUNITY): Payer: Self-pay

## 2024-09-26 ENCOUNTER — Telehealth (HOSPITAL_COMMUNITY): Payer: Self-pay

## 2024-09-26 NOTE — Telephone Encounter (Signed)
 Spoke with patient to discuss upcoming procedure.   CT: not required.  Labs: completed.   Any recent signs of acute illness or been started on antibiotics? No Any new medications started?No  Any missed doses of blood thinner?  No  Advised patient to continue taking Eliquis  (Apixaban ) twice daily without missing any doses. Any medications to hold?  routine Medication instructions:  On the morning of your procedure DO NOT take any medication., including Eliquis  (Apixaban ) or the procedure may be rescheduled. Nothing to eat or drink after midnight prior to your procedure.  Confirmed patient is scheduled for Atrial Fibrillation Ablation on Thursday, January 29 with Dr. Nancey. Instructed patient to arrive at the Main Entrance A at [facility]: [address] [place], [place] [address] and check in at Admitting at 9:30 AM.   Plan to go home the same day, you will only stay overnight if medically necessary. You MUST have a responsible adult to drive you home and MUST be with you the first 24 hours after you arrive home or your procedure could be cancelled.  Informed patient a nurse will call a day before the procedure to confirm arrival time and ensure instructions are followed.  Patient verbalized understanding to all instructions provided and agreed to proceed with procedure.   Advised patient to contact RN Navigator at [phone], to inform of any new medications started after call or concerns prior to procedure.

## 2024-09-26 NOTE — Telephone Encounter (Signed)
 Attempted to reach patient to discuss upcoming procedure, no answer. Left VM for patient to return call.

## 2024-09-27 ENCOUNTER — Ambulatory Visit: Admitting: "Endocrinology

## 2024-10-03 ENCOUNTER — Other Ambulatory Visit (HOSPITAL_COMMUNITY): Payer: Self-pay

## 2024-10-11 NOTE — Pre-Procedure Instructions (Signed)
 Attempted to call patient regarding procedure instructions.  Left voicemail on the following items: Arrival time 0930 Nothing to eat or drink after midnight No meds AM of procedure Responsible person to drive you home and stay with you for 24 hrs  Have you missed any doses of anti-coagulant Eliquis - should be taken twice a day, if you have missed any doses please let us  know.  Don't take dose morning of procedure.

## 2024-10-12 ENCOUNTER — Encounter (HOSPITAL_COMMUNITY): Admission: RE | Disposition: A | Payer: Self-pay | Source: Home / Self Care | Attending: Cardiovascular Disease

## 2024-10-12 ENCOUNTER — Ambulatory Visit (HOSPITAL_COMMUNITY): Admitting: Anesthesiology

## 2024-10-12 ENCOUNTER — Ambulatory Visit (HOSPITAL_COMMUNITY)
Admission: RE | Admit: 2024-10-12 | Discharge: 2024-10-12 | Disposition: A | Discharge status: Home / Self Care | Attending: Cardiovascular Disease | Admitting: Cardiovascular Disease

## 2024-10-12 DIAGNOSIS — I480 Paroxysmal atrial fibrillation: Secondary | ICD-10-CM | POA: Insufficient documentation

## 2024-10-12 DIAGNOSIS — D6869 Other thrombophilia: Secondary | ICD-10-CM | POA: Diagnosis not present

## 2024-10-12 DIAGNOSIS — Z87891 Personal history of nicotine dependence: Secondary | ICD-10-CM | POA: Diagnosis not present

## 2024-10-12 DIAGNOSIS — K219 Gastro-esophageal reflux disease without esophagitis: Secondary | ICD-10-CM | POA: Insufficient documentation

## 2024-10-12 MED ORDER — HEPARIN SODIUM (PORCINE) 1000 UNIT/ML IJ SOLN
INTRAMUSCULAR | Status: DC | PRN
  Administered 2024-10-12: 14000 [IU] via INTRAVENOUS

## 2024-10-12 MED ORDER — ACETAMINOPHEN 325 MG PO TABS: 650.0000 mg | ORAL_TABLET | ORAL | Status: DC | PRN

## 2024-10-12 MED ORDER — PROTAMINE SULFATE 10 MG/ML IV SOLN
INTRAVENOUS | Status: DC | PRN
  Administered 2024-10-12: 50 mg via INTRAVENOUS

## 2024-10-12 MED ORDER — HEPARIN SODIUM (PORCINE) 1000 UNIT/ML IJ SOLN
INTRAMUSCULAR | Status: AC
  Filled 2024-10-12: qty 10

## 2024-10-12 MED ORDER — ONDANSETRON HCL 4 MG/2ML IJ SOLN
4.0000 mg | Freq: Four times a day (QID) | INTRAMUSCULAR | Status: DC | PRN
  Administered 2024-10-12: 4 mg via INTRAVENOUS
  Filled 2024-10-12: qty 2

## 2024-10-12 MED ORDER — SUCCINYLCHOLINE CHLORIDE 200 MG/10ML IV SOSY
PREFILLED_SYRINGE | INTRAVENOUS | Status: DC | PRN
  Administered 2024-10-12: 80 mg via INTRAVENOUS

## 2024-10-12 MED ORDER — ATROPINE SULFATE 1 MG/10ML IJ SOSY
PREFILLED_SYRINGE | INTRAMUSCULAR | Status: AC
  Filled 2024-10-12: qty 10

## 2024-10-12 MED ORDER — DEXAMETHASONE SOD PHOSPHATE PF 10 MG/ML IJ SOLN
INTRAMUSCULAR | Status: DC | PRN
  Administered 2024-10-12: 10 mg via INTRAVENOUS

## 2024-10-12 MED ORDER — FENTANYL CITRATE (PF) 100 MCG/2ML IJ SOLN
INTRAMUSCULAR | Status: AC
  Filled 2024-10-12: qty 2

## 2024-10-12 MED ORDER — SUGAMMADEX SODIUM 200 MG/2ML IV SOLN
INTRAVENOUS | Status: DC | PRN
  Administered 2024-10-12: 200 mg via INTRAVENOUS

## 2024-10-12 MED ORDER — ATROPINE SULFATE 1 MG/10ML IJ SOSY
PREFILLED_SYRINGE | INTRAMUSCULAR | Status: DC | PRN
  Administered 2024-10-12: 1 mg via INTRAVENOUS

## 2024-10-12 MED ORDER — PROPOFOL 500 MG/50ML IV EMUL
INTRAVENOUS | Status: DC | PRN
  Administered 2024-10-12: 125 ug/kg/min via INTRAVENOUS

## 2024-10-12 MED ORDER — ROCURONIUM BROMIDE 10 MG/ML (PF) SYRINGE
PREFILLED_SYRINGE | INTRAVENOUS | Status: DC | PRN
  Administered 2024-10-12: 40 mg via INTRAVENOUS

## 2024-10-12 MED ORDER — HEPARIN (PORCINE) IN NACL 1000-0.9 UT/500ML-% IV SOLN
INTRAVENOUS | Status: DC | PRN
  Administered 2024-10-12 (×2): 500 mL

## 2024-10-12 MED ORDER — SODIUM CHLORIDE 0.9 % IV SOLN: INTRAVENOUS | Status: DC

## 2024-10-12 MED ORDER — FENTANYL CITRATE (PF) 250 MCG/5ML IJ SOLN
INTRAMUSCULAR | Status: DC | PRN
  Administered 2024-10-12: 100 ug via INTRAVENOUS

## 2024-10-12 MED ORDER — LIDOCAINE 2% (20 MG/ML) 5 ML SYRINGE
INTRAMUSCULAR | Status: DC | PRN
  Administered 2024-10-12: 100 mg via INTRAVENOUS

## 2024-10-12 MED ORDER — PHENYLEPHRINE 80 MCG/ML (10ML) SYRINGE FOR IV PUSH (FOR BLOOD PRESSURE SUPPORT)
PREFILLED_SYRINGE | INTRAVENOUS | Status: DC | PRN
  Administered 2024-10-12: 160 ug via INTRAVENOUS

## 2024-10-12 MED ORDER — PROPOFOL 10 MG/ML IV BOLUS
INTRAVENOUS | Status: DC | PRN
  Administered 2024-10-12: 150 mg via INTRAVENOUS

## 2024-10-12 MED ORDER — ONDANSETRON HCL 4 MG/2ML IJ SOLN
INTRAMUSCULAR | Status: DC | PRN
  Administered 2024-10-12: 4 mg via INTRAVENOUS

## 2024-10-12 MED ORDER — PHENYLEPHRINE HCL-NACL 20-0.9 MG/250ML-% IV SOLN
INTRAVENOUS | Status: DC | PRN
  Administered 2024-10-12: 25 ug/min via INTRAVENOUS

## 2024-10-12 NOTE — Anesthesia Postprocedure Evaluation (Signed)
"   Anesthesia Post Note  Patient: Jill Leonard  Procedure(s) Performed: ATRIAL FIBRILLATION ABLATION     Patient location during evaluation: PACU Anesthesia Type: General Level of consciousness: awake and alert Pain management: pain level controlled Vital Signs Assessment: post-procedure vital signs reviewed and stable Respiratory status: spontaneous breathing, nonlabored ventilation, respiratory function stable and patient connected to nasal cannula oxygen Cardiovascular status: blood pressure returned to baseline and stable Postop Assessment: no apparent nausea or vomiting Anesthetic complications: no   There were no known notable events for this encounter.  Last Vitals:  Vitals:   10/12/24 1500 10/12/24 1505  BP: 92/64   Pulse: 96 91  Resp: 14 17  Temp:    SpO2: 98% 99%    Last Pain:  Vitals:   10/12/24 1455  TempSrc: Oral  PainSc:                  Rome Ade      "

## 2024-10-12 NOTE — Transfer of Care (Signed)
 Immediate Anesthesia Transfer of Care Note  Patient: Jill Leonard  Procedure(s) Performed: ATRIAL FIBRILLATION ABLATION  Patient Location: Cath Lab  Anesthesia Type:General  Level of Consciousness: drowsy and patient cooperative  Airway & Oxygen Therapy: Patient Spontanous Breathing and Patient connected to nasal cannula oxygen  Post-op Assessment: Report given to RN and Post -op Vital signs reviewed and stable  Post vital signs: Reviewed and stable  Last Vitals:  Vitals Value Taken Time  BP 123/100 10/12/24 14:53  Temp    Pulse 103 10/12/24 14:55  Resp 16 10/12/24 14:55  SpO2 95 % 10/12/24 14:55  Vitals shown include unfiled device data.  Last Pain:  Vitals:   10/12/24 1145  PainSc: 0-No pain         Complications: There were no known notable events for this encounter.

## 2024-10-12 NOTE — H&P (Signed)
" °  Electrophysiology Office Note:    Date:  10/12/2024   ID:  KRIPA FOSKEY, DOB 1971-01-17, MRN [id number]  PCP:  Avelina Greig BRAVO, MD   [facility] Providers Cardiologist:  Deatrice Cage, MD     Referring MD: No ref. provider found   History of Present Illness:    Jill Leonard is a 54 y.o. female with a medical history significant for paroxysmal atrial fibrillation, Sjogren syndrome, referred for arrhythmia management.       History of Present Illness  She is referred from atrial fibrillation clinic where she has been managed with flecainide .  She has been doing very well with flecainide .  1 day in May, she reduced her dose to half pill and had an episode of atrial fibrillation.  To her knowledge, she has not had any recurrence of atrial fibrillation as long as she is on flecainide          Today, she reports that she is well and has no acute complaints. I reviewed the  labs. she  has not missed any doses of anticoagulation, and she took her dose last night. There have been no changes in the patient's diagnoses, medications, or condition since our recent clinic visit.   EKGs/Labs/Other Studies Reviewed Today:     Echocardiogram:  TTE July 2024 LVEF 65 to 70%.  No wall motion abnormalities.   Monitors:  14 day monitor November 2023-- my interpretation Sinus rhythm, heart rate 46 to 139 bpm, average 68 beats minute 1% burden of atrial fibrillation, longest episode 3 hours 17 minutes.  Average rate in A-fib 96 bpm. Symptom episodes were associated with atrial ectopy and with atrial fibrillation.  Stress testing:  Exercise tolerance test January 2025 No evidence of ischemia  Advanced imaging:  CT coronary November 2025 Coronary calcium  score 0    EKG:         Physical Exam:    VS:  BP 121/79   Pulse 78   Temp 98.9 F (37.2 C)   Resp 17   Ht 5' 7 (1.702 m)   Wt 74.8 kg   LMP 07/03/2018   SpO2 99%   BMI 25.84 kg/m     Wt Readings  from Last 3 Encounters:  10/12/24 74.8 kg  08/07/24 75.3 kg  07/21/24 74.4 kg     GEN: Well nourished, well developed in no acute distress CARDIAC: RRR, no murmurs, rubs, gallops RESPIRATORY:  Normal work of breathing MUSCULOSKELETAL: no edema    ASSESSMENT & PLAN:     Paroxysmal atrial fibrillation Maintaining normal sinus rhythm on flecainide  50 We discussed the relative risk and benefits of ablation versus continuing flecainide .  I explained the ablation procedure including risks of possible need for cardiac surgery, prolonged hospital stay, and a low but nonzero risk of stroke heart attack or death. She we will consider whether or not she would prefer to continue flecainide  or opt to have the ablation.  Secondary hypercoagulable state CHA2DS2-VASc score is 1 Will need to start Eliquis  in the periprocedural period if she opts to pursue ablation    Signed, Eulas BRAVO [place], MD  10/12/2024 1:12 PM    [facility]  "

## 2024-10-12 NOTE — Discharge Instructions (Signed)

## 2024-10-12 NOTE — Anesthesia Procedure Notes (Addendum)
 Procedure Name: Intubation Date/Time: 10/12/2024 1:47 PM  Performed by: Marva Lonni PARAS, CRNAPre-anesthesia Checklist: Patient identified, Emergency Drugs available, Suction available and Patient being monitored Patient Re-evaluated:Patient Re-evaluated prior to induction Oxygen Delivery Method: Circle System Utilized Preoxygenation: Pre-oxygenation with 100% oxygen Induction Type: IV induction and Rapid sequence Laryngoscope Size: Mac and 3 Grade View: Grade I Tube type: Oral Tube size: 6.5 mm Number of attempts: 1 Airway Equipment and Method: Stylet and Oral airway Placement Confirmation: ETT inserted through vocal cords under direct vision, positive ETCO2 and breath sounds checked- equal and bilateral Secured at: 21 cm Tube secured with: Tape Dental Injury: Teeth and Oropharynx as per pre-operative assessment

## 2024-10-12 NOTE — Anesthesia Preprocedure Evaluation (Signed)
"                                    Anesthesia Evaluation  Patient identified by MRN, date of birth, ID band Patient awake    Reviewed: Allergy & Precautions, NPO status , Patient's Chart, lab work & pertinent test results  History of Anesthesia Complications (+) PONV and history of anesthetic complications  Airway Mallampati: III  TM Distance: >3 FB Neck ROM: Full    Dental no notable dental hx. (+) Teeth Intact   Pulmonary neg pulmonary ROS, neg sleep apnea, neg COPD, Patient abstained from smoking.Not current smoker   Pulmonary exam normal breath sounds clear to auscultation       Cardiovascular Exercise Tolerance: Good METS(-) hypertension(-) CAD and (-) Past MI + dysrhythmias Atrial Fibrillation  Rhythm:Regular Rate:Normal - Systolic murmurs    Neuro/Psych  PSYCHIATRIC DISORDERS Anxiety Depression    negative neurological ROS     GI/Hepatic ,GERD  Medicated and Controlled,,(+)     (-) substance abuse    Endo/Other  neg diabetes    Renal/GU negative Renal ROS     Musculoskeletal  (+)  Fibromyalgia -  Abdominal   Peds  Hematology   Anesthesia Other Findings Past Medical History: No date: Anal fissure No date: Arthralgia of temporomandibular joint     Comment:  MIGRAINES AND REGULAR No date: Atrial fibrillation (HCC)     Comment:  per patient No date: Calculus of gallbladder without mention of cholecystitis or  obstruction No date: Complication of anesthesia No date: Depression No date: Dysrhythmia     Comment:  RAPID HEARTBEAT AND SKIPPED BEATS ON METOPROLOL  No date: Family history of adverse reaction to anesthesia     Comment:  FAMILY MEMBERS HAVE PONV No date: Fibromyalgia No date: GERD (gastroesophageal reflux disease) No date: Heart murmur     Comment:  IN PAST, TOLD IS GONE NOW No date: History of kidney stones No date: History of nephrolithiasis No date: HLD (hyperlipidemia) No date: PONV (postoperative nausea and  vomiting) No date: Raynaud's disease     Comment:  per patient No date: Sjogren - Larsson's syndrome No date: Sjogren's syndrome  Reproductive/Obstetrics                              Anesthesia Physical Anesthesia Plan  ASA: 3  Anesthesia Plan: General   Post-op Pain Management: Minimal or no pain anticipated   Induction: Intravenous  PONV Risk Score and Plan: 4 or greater and Ondansetron , Dexamethasone , Midazolam , Propofol  infusion and TIVA  Airway Management Planned: Oral ETT  Additional Equipment: None  Intra-op Plan:   Post-operative Plan: Extubation in OR  Informed Consent: I have reviewed the patients History and Physical, chart, labs and discussed the procedure including the risks, benefits and alternatives for the proposed anesthesia with the patient or authorized representative who has indicated his/her understanding and acceptance.     Dental advisory given  Plan Discussed with: CRNA and Surgeon  Anesthesia Plan Comments: (Discussed risks of anesthesia with patient, including PONV, sore throat, lip/dental/eye damage. Rare risks discussed as well, such as cardiorespiratory and neurological sequelae, and allergic reactions. Discussed the role of CRNA in patient's perioperative care. Patient understands.)        Anesthesia Quick Evaluation  "

## 2024-10-13 ENCOUNTER — Encounter (HOSPITAL_COMMUNITY): Payer: Self-pay | Admitting: Cardiovascular Disease

## 2024-10-13 LAB — POCT ACTIVATED CLOTTING TIME: Activated Clotting Time: 389 s

## 2024-10-13 MED FILL — Fentanyl Citrate Preservative Free (PF) Inj 100 MCG/2ML: INTRAMUSCULAR | Qty: 2 | Status: AC

## 2024-10-14 ENCOUNTER — Other Ambulatory Visit: Payer: Self-pay | Admitting: Family Medicine

## 2024-10-14 DIAGNOSIS — K219 Gastro-esophageal reflux disease without esophagitis: Secondary | ICD-10-CM

## 2024-11-09 ENCOUNTER — Ambulatory Visit (HOSPITAL_COMMUNITY): Admitting: Internal Medicine

## 2024-11-29 ENCOUNTER — Ambulatory Visit (HOSPITAL_COMMUNITY): Admitting: Internal Medicine

## 2024-12-28 ENCOUNTER — Ambulatory Visit: Admitting: "Endocrinology

## 2025-01-10 ENCOUNTER — Ambulatory Visit: Admitting: Cardiology
